# Patient Record
Sex: Female | Born: 1935 | Race: White | Hispanic: No | Smoking: Never smoker
Health system: Southern US, Community
[De-identification: ages and names within clinical notes are randomized; demographics above are authoritative.]

## PROBLEM LIST (undated history)

## (undated) DIAGNOSIS — E785 Hyperlipidemia, unspecified: Secondary | ICD-10-CM

## (undated) DIAGNOSIS — F419 Anxiety disorder, unspecified: Secondary | ICD-10-CM

## (undated) DIAGNOSIS — E46 Unspecified protein-calorie malnutrition: Secondary | ICD-10-CM

## (undated) DIAGNOSIS — R7989 Other specified abnormal findings of blood chemistry: Secondary | ICD-10-CM

## (undated) DIAGNOSIS — I10 Essential (primary) hypertension: Secondary | ICD-10-CM

## (undated) DIAGNOSIS — I4719 Other supraventricular tachycardia: Secondary | ICD-10-CM

## (undated) DIAGNOSIS — I341 Nonrheumatic mitral (valve) prolapse: Secondary | ICD-10-CM

## (undated) DIAGNOSIS — I480 Paroxysmal atrial fibrillation: Secondary | ICD-10-CM

## (undated) DIAGNOSIS — R9389 Abnormal findings on diagnostic imaging of other specified body structures: Secondary | ICD-10-CM

## (undated) DIAGNOSIS — I951 Orthostatic hypotension: Secondary | ICD-10-CM

## (undated) DIAGNOSIS — I340 Nonrheumatic mitral (valve) insufficiency: Secondary | ICD-10-CM

## (undated) DIAGNOSIS — N182 Chronic kidney disease, stage 2 (mild): Secondary | ICD-10-CM

## (undated) DIAGNOSIS — C8590 Non-Hodgkin lymphoma, unspecified, unspecified site: Secondary | ICD-10-CM

## (undated) DIAGNOSIS — G459 Transient cerebral ischemic attack, unspecified: Secondary | ICD-10-CM

## (undated) DIAGNOSIS — I4729 Other ventricular tachycardia: Secondary | ICD-10-CM

## (undated) DIAGNOSIS — R55 Syncope and collapse: Secondary | ICD-10-CM

## (undated) DIAGNOSIS — I493 Ventricular premature depolarization: Secondary | ICD-10-CM

## (undated) DIAGNOSIS — I77810 Thoracic aortic ectasia: Secondary | ICD-10-CM

## (undated) DIAGNOSIS — M810 Age-related osteoporosis without current pathological fracture: Secondary | ICD-10-CM

## (undated) DIAGNOSIS — I471 Supraventricular tachycardia: Secondary | ICD-10-CM

## (undated) DIAGNOSIS — I472 Ventricular tachycardia: Secondary | ICD-10-CM

## (undated) HISTORY — DX: Age-related osteoporosis without current pathological fracture: M81.0

## (undated) HISTORY — PX: CATARACT EXTRACTION: SUR2

## (undated) HISTORY — DX: Transient cerebral ischemic attack, unspecified: G45.9

## (undated) HISTORY — PX: OTHER SURGICAL HISTORY: SHX169

## (undated) HISTORY — DX: Chronic kidney disease, stage 2 (mild): N18.2

---

## 2007-09-28 ENCOUNTER — Ambulatory Visit: Payer: Self-pay | Admitting: Cardiology

## 2007-10-05 ENCOUNTER — Ambulatory Visit: Payer: Self-pay

## 2007-10-05 ENCOUNTER — Encounter: Payer: Self-pay | Admitting: Cardiology

## 2007-10-21 ENCOUNTER — Ambulatory Visit: Payer: Self-pay | Admitting: Cardiology

## 2008-01-28 ENCOUNTER — Encounter: Admission: RE | Admit: 2008-01-28 | Discharge: 2008-01-28 | Payer: Self-pay | Admitting: Internal Medicine

## 2008-01-28 ENCOUNTER — Ambulatory Visit: Payer: Self-pay | Admitting: Oncology

## 2008-02-21 ENCOUNTER — Ambulatory Visit (HOSPITAL_COMMUNITY): Admission: RE | Admit: 2008-02-21 | Discharge: 2008-02-21 | Payer: Self-pay | Admitting: Internal Medicine

## 2008-02-21 LAB — CBC WITH DIFFERENTIAL/PLATELET
BASO%: 0.7 % (ref 0.0–2.0)
Basophils Absolute: 0 10*3/uL (ref 0.0–0.1)
EOS%: 2.2 % (ref 0.0–7.0)
Eosinophils Absolute: 0.1 10*3/uL (ref 0.0–0.5)
HCT: 37.3 % (ref 34.8–46.6)
LYMPH%: 44.4 % (ref 14.0–48.0)
MCH: 34.4 pg — ABNORMAL HIGH (ref 26.0–34.0)
NEUT#: 2 10*3/uL (ref 1.5–6.5)
NEUT%: 42.5 % (ref 39.6–76.8)
Platelets: 176 10*3/uL (ref 145–400)
RDW: 13.8 % (ref 11.3–14.5)
WBC: 4.7 10*3/uL (ref 3.9–10.0)

## 2008-02-21 LAB — MORPHOLOGY
PLT EST: ADEQUATE
RBC Comments: NORMAL

## 2008-02-22 LAB — COMPREHENSIVE METABOLIC PANEL
ALT: 20 U/L (ref 0–35)
CO2: 26 mEq/L (ref 19–32)
Total Protein: 6.5 g/dL (ref 6.0–8.3)

## 2008-04-25 ENCOUNTER — Emergency Department (HOSPITAL_COMMUNITY): Admission: EM | Admit: 2008-04-25 | Discharge: 2008-04-25 | Payer: Self-pay | Admitting: Emergency Medicine

## 2008-06-05 ENCOUNTER — Ambulatory Visit: Payer: Self-pay | Admitting: Oncology

## 2008-06-07 ENCOUNTER — Ambulatory Visit (HOSPITAL_COMMUNITY): Admission: RE | Admit: 2008-06-07 | Discharge: 2008-06-07 | Payer: Self-pay | Admitting: Oncology

## 2008-06-07 LAB — CBC WITH DIFFERENTIAL/PLATELET
Basophils Absolute: 0 10*3/uL (ref 0.0–0.1)
HGB: 13.3 g/dL (ref 11.6–15.9)
MCV: 99.6 fL (ref 81.0–101.0)
MONO#: 0.4 10*3/uL (ref 0.1–0.9)
NEUT#: 2.6 10*3/uL (ref 1.5–6.5)
NEUT%: 54.4 % (ref 39.6–76.8)
WBC: 4.8 10*3/uL (ref 3.9–10.0)
lymph#: 1.8 10*3/uL (ref 0.9–3.3)

## 2008-06-07 LAB — LACTATE DEHYDROGENASE: LDH: 145 U/L (ref 94–250)

## 2008-06-07 LAB — COMPREHENSIVE METABOLIC PANEL
Alkaline Phosphatase: 76 U/L (ref 39–117)
CO2: 22 mEq/L (ref 19–32)
Calcium: 9.3 mg/dL (ref 8.4–10.5)
Sodium: 133 mEq/L — ABNORMAL LOW (ref 135–145)

## 2008-11-29 ENCOUNTER — Ambulatory Visit: Payer: Self-pay | Admitting: Oncology

## 2010-06-24 ENCOUNTER — Encounter: Payer: Self-pay | Admitting: Internal Medicine

## 2010-07-23 ENCOUNTER — Telehealth (INDEPENDENT_AMBULATORY_CARE_PROVIDER_SITE_OTHER): Payer: Self-pay | Admitting: *Deleted

## 2010-07-30 NOTE — Progress Notes (Signed)
  Pt signed ROI,faxed to me I Mailed out records to pt today [facility]  July 23, 2010 2:11 PM

## 2010-10-15 NOTE — Assessment & Plan Note (Signed)
[facility]                            CARDIOLOGY OFFICE NOTE   NAME:[name], [name]                        MRN:          [id number]  DATE:10/21/2007                            DOB:          05/10/1934    PRIMARY CARE PHYSICIAN:  Della Goo, M.D.   REASON FOR PRESENTATION:  Evaluate a patient with fatigue and previous  cardiomyopathy.   HISTORY OF PRESENT ILLNESS:  The patient is a lovely 77 year old white  female with a history of cardiomyopathy related to doxorubicin and CHOP  in the past.  This was in 2006.  She had not had any followup since that  time.  She came to me when she was describing increased dyspnea with  exertion.  I was delighted when I ordered an echo and she had an  ejection fraction of 60%.  She had some mild mitral valve prolapse but  no significant regurgitation.   She returns for followup.  She says that she is actually getting better.  She has been able to walk more.  She is having much less dyspnea.  She  is not having any PND or orthopnea.  She has had no palpitations,  presyncope or syncope.   Of note she did have carotid Dopplers demonstrating bilateral less than  40% stenosis.   PAST MEDICAL HISTORY:  Hypertension x10 years, hyperlipidemia, previous  cardiomyopathy as described, anemia, TIA in 2006, non-Hodgkin's lymphoma  treated with CHOP and doxorubicin.   ALLERGIES:  BIAXIN AND ZOCOR.   MEDICATIONS:  Toprol XL 25 mg daily, Lexapro 10 mg daily, Altace 5 mg  b.i.d., Lipitor 20 mg daily, aspirin 325 mg daily.   REVIEW OF SYSTEMS:  As stated in the HPI and otherwise negative for  other systems.   PHYSICAL EXAMINATION:  The patient is in no distress.  Blood pressure  123/64, heart rate 61 and regular, weight 118 pounds, body mass index  19.  NECK:  No jugular venous distention at 45 degrees.  Carotid upstrokes  brisk and symmetrical.  No bruits, no thyromegaly.  LYMPHATICS:  No cervical, axillary, or  inguinal adenopathy.  LUNGS:  Clear to auscultation bilaterally.  HEART:  PMI not displaced or sustained.  S1 and S2 within normal limits.  No S3, no S4.  No clicks, no rubs, no murmurs.  ABDOMEN:  Flat.  Positive bowel sounds.  Normal in frequency and pitch.  No bruits, rebound, guarding or midline pulsatile mass.  No hepatomegaly  or splenomegaly.  SKIN:  No rashes.  No nodules.  EXTREMITIES:  2+ pulses.  No edema.   ASSESSMENT/PLAN:  1. Dyspnea.  The patient's dyspnea is actually improved.  I wonder if      this could have been related to allergies.  She is new to this      area.  It seems to be correlating with a high peak pollen count.      At this point I do not see a cardiovascular etiology and would      suggest no further cardiovascular testing.  Perhaps pulmonary      function testing  or referral to a pulmonologist would be      appropriate if her symptoms worsen.  2. Hypertension.  Blood pressure is well controlled and she will      continue the medications as listed.  3. Carotid stenosis.  The patient has some mild carotid stenosis and      is scheduled to have a followup in 1 year.  4. Dyslipidemia.  Per Dr. Lovell Sheehan.  5. Followup can be back in this clinic as needed.     Rollene Rotunda, MD, [facility]  Electronically Signed    JH/MedQ  DD: 10/21/2007  DT: 10/21/2007  Job #: [id number]   cc:   Della Goo, M.D.
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
## 2011-03-03 LAB — GLUCOSE, CAPILLARY: Glucose-Capillary: 86

## 2011-03-04 LAB — URINALYSIS, ROUTINE W REFLEX MICROSCOPIC
Bilirubin Urine: NEGATIVE
Glucose, UA: NEGATIVE
Hgb urine dipstick: NEGATIVE
Ketones, ur: NEGATIVE
Nitrite: NEGATIVE
Protein, ur: NEGATIVE
Specific Gravity, Urine: 1.006
Urobilinogen, UA: 0.2
pH: 7.5

## 2011-03-04 LAB — DIFFERENTIAL
Basophils Absolute: 0
Basophils Relative: 1
Eosinophils Absolute: 0
Eosinophils Relative: 1
Lymphocytes Relative: 27
Lymphs Abs: 1.1
Monocytes Absolute: 0.2
Monocytes Relative: 5
Neutro Abs: 2.6
Neutrophils Relative %: 66

## 2011-03-04 LAB — CBC
HCT: 39.5
Hemoglobin: 13.2
MCHC: 33.5
MCV: 102.1 — ABNORMAL HIGH
Platelets: 160
RBC: 3.87
RDW: 13.8
WBC: 3.9 — ABNORMAL LOW

## 2011-03-04 LAB — BASIC METABOLIC PANEL WITH GFR
Chloride: 101
GFR calc non Af Amer: >60
Glucose, Bld: 132 — ABNORMAL HIGH

## 2011-03-04 LAB — BASIC METABOLIC PANEL
BUN: 4 — ABNORMAL LOW
CO2: 29
Calcium: 9.7
Creatinine, Ser: 0.89
GFR calc Af Amer: >60
Potassium: 4.5
Sodium: 137

## 2011-03-04 LAB — OCCULT BLOOD X 1 CARD TO LAB, STOOL: Fecal Occult Bld: NEGATIVE

## 2011-03-04 LAB — PROTIME-INR
INR: 1
Prothrombin Time: 12.8

## 2011-03-04 LAB — POCT CARDIAC MARKERS
CKMB, poc: <1 — ABNORMAL LOW
Myoglobin, poc: 73.7
Troponin i, poc: <0.05

## 2011-03-04 LAB — APTT: aPTT: 22 — ABNORMAL LOW

## 2011-07-29 DIAGNOSIS — C8589 Other specified types of non-Hodgkin lymphoma, extranodal and solid organ sites: Secondary | ICD-10-CM | POA: Diagnosis not present

## 2011-08-21 DIAGNOSIS — R0602 Shortness of breath: Secondary | ICD-10-CM | POA: Diagnosis not present

## 2011-08-21 DIAGNOSIS — R42 Dizziness and giddiness: Secondary | ICD-10-CM | POA: Diagnosis not present

## 2011-08-21 DIAGNOSIS — I10 Essential (primary) hypertension: Secondary | ICD-10-CM | POA: Diagnosis not present

## 2011-08-21 DIAGNOSIS — E7800 Pure hypercholesterolemia, unspecified: Secondary | ICD-10-CM | POA: Diagnosis not present

## 2011-09-18 DIAGNOSIS — I10 Essential (primary) hypertension: Secondary | ICD-10-CM | POA: Diagnosis not present

## 2011-09-18 DIAGNOSIS — M810 Age-related osteoporosis without current pathological fracture: Secondary | ICD-10-CM | POA: Diagnosis not present

## 2011-09-18 DIAGNOSIS — E0590 Thyrotoxicosis, unspecified without thyrotoxic crisis or storm: Secondary | ICD-10-CM | POA: Diagnosis not present

## 2011-09-18 DIAGNOSIS — I059 Rheumatic mitral valve disease, unspecified: Secondary | ICD-10-CM | POA: Diagnosis not present

## 2011-09-18 DIAGNOSIS — M159 Polyosteoarthritis, unspecified: Secondary | ICD-10-CM | POA: Diagnosis not present

## 2011-09-18 DIAGNOSIS — E7800 Pure hypercholesterolemia, unspecified: Secondary | ICD-10-CM | POA: Diagnosis not present

## 2011-09-18 DIAGNOSIS — R079 Chest pain, unspecified: Secondary | ICD-10-CM | POA: Diagnosis not present

## 2011-09-18 DIAGNOSIS — C8589 Other specified types of non-Hodgkin lymphoma, extranodal and solid organ sites: Secondary | ICD-10-CM | POA: Diagnosis not present

## 2011-10-08 DIAGNOSIS — I10 Essential (primary) hypertension: Secondary | ICD-10-CM | POA: Diagnosis not present

## 2011-10-08 DIAGNOSIS — I2510 Atherosclerotic heart disease of native coronary artery without angina pectoris: Secondary | ICD-10-CM | POA: Diagnosis not present

## 2011-10-08 DIAGNOSIS — I248 Other forms of acute ischemic heart disease: Secondary | ICD-10-CM | POA: Diagnosis not present

## 2011-10-08 DIAGNOSIS — E7800 Pure hypercholesterolemia, unspecified: Secondary | ICD-10-CM | POA: Diagnosis not present

## 2011-10-14 DIAGNOSIS — R9439 Abnormal result of other cardiovascular function study: Secondary | ICD-10-CM | POA: Diagnosis not present

## 2011-10-14 DIAGNOSIS — I2510 Atherosclerotic heart disease of native coronary artery without angina pectoris: Secondary | ICD-10-CM | POA: Diagnosis not present

## 2011-10-14 DIAGNOSIS — R0609 Other forms of dyspnea: Secondary | ICD-10-CM | POA: Diagnosis not present

## 2011-10-14 DIAGNOSIS — R0989 Other specified symptoms and signs involving the circulatory and respiratory systems: Secondary | ICD-10-CM | POA: Diagnosis not present

## 2011-10-23 DIAGNOSIS — E7800 Pure hypercholesterolemia, unspecified: Secondary | ICD-10-CM | POA: Diagnosis not present

## 2011-10-23 DIAGNOSIS — I4891 Unspecified atrial fibrillation: Secondary | ICD-10-CM | POA: Diagnosis not present

## 2011-10-23 DIAGNOSIS — R0602 Shortness of breath: Secondary | ICD-10-CM | POA: Diagnosis not present

## 2011-10-23 DIAGNOSIS — R9431 Abnormal electrocardiogram [ECG] [EKG]: Secondary | ICD-10-CM | POA: Diagnosis not present

## 2011-10-30 DIAGNOSIS — I059 Rheumatic mitral valve disease, unspecified: Secondary | ICD-10-CM | POA: Diagnosis not present

## 2011-10-30 DIAGNOSIS — R002 Palpitations: Secondary | ICD-10-CM | POA: Diagnosis not present

## 2011-10-30 DIAGNOSIS — I4891 Unspecified atrial fibrillation: Secondary | ICD-10-CM | POA: Diagnosis not present

## 2011-11-06 DIAGNOSIS — I059 Rheumatic mitral valve disease, unspecified: Secondary | ICD-10-CM | POA: Diagnosis not present

## 2011-11-06 DIAGNOSIS — R579 Shock, unspecified: Secondary | ICD-10-CM | POA: Diagnosis not present

## 2011-11-06 DIAGNOSIS — E7800 Pure hypercholesterolemia, unspecified: Secondary | ICD-10-CM | POA: Diagnosis not present

## 2011-11-06 DIAGNOSIS — I10 Essential (primary) hypertension: Secondary | ICD-10-CM | POA: Diagnosis not present

## 2011-12-05 DIAGNOSIS — Z0000 Encounter for general adult medical examination without abnormal findings: Secondary | ICD-10-CM | POA: Diagnosis not present

## 2011-12-15 DIAGNOSIS — I4891 Unspecified atrial fibrillation: Secondary | ICD-10-CM | POA: Diagnosis not present

## 2011-12-31 DIAGNOSIS — E7800 Pure hypercholesterolemia, unspecified: Secondary | ICD-10-CM | POA: Diagnosis not present

## 2011-12-31 DIAGNOSIS — R002 Palpitations: Secondary | ICD-10-CM | POA: Diagnosis not present

## 2011-12-31 DIAGNOSIS — I10 Essential (primary) hypertension: Secondary | ICD-10-CM | POA: Diagnosis not present

## 2011-12-31 DIAGNOSIS — I2510 Atherosclerotic heart disease of native coronary artery without angina pectoris: Secondary | ICD-10-CM | POA: Diagnosis not present

## 2012-01-16 DIAGNOSIS — C8599 Non-Hodgkin lymphoma, unspecified, extranodal and solid organ sites: Secondary | ICD-10-CM

## 2012-02-05 DIAGNOSIS — I2510 Atherosclerotic heart disease of native coronary artery without angina pectoris: Secondary | ICD-10-CM | POA: Diagnosis not present

## 2012-02-05 DIAGNOSIS — E785 Hyperlipidemia, unspecified: Secondary | ICD-10-CM | POA: Diagnosis not present

## 2012-02-05 DIAGNOSIS — M1990 Unspecified osteoarthritis, unspecified site: Secondary | ICD-10-CM | POA: Diagnosis not present

## 2012-02-05 DIAGNOSIS — I10 Essential (primary) hypertension: Secondary | ICD-10-CM | POA: Diagnosis not present

## 2012-03-05 DIAGNOSIS — J020 Streptococcal pharyngitis: Secondary | ICD-10-CM | POA: Diagnosis not present

## 2012-04-07 DIAGNOSIS — E785 Hyperlipidemia, unspecified: Secondary | ICD-10-CM | POA: Diagnosis not present

## 2012-04-07 DIAGNOSIS — H6690 Otitis media, unspecified, unspecified ear: Secondary | ICD-10-CM | POA: Diagnosis not present

## 2012-11-25 DIAGNOSIS — N390 Urinary tract infection, site not specified: Secondary | ICD-10-CM | POA: Diagnosis not present

## 2012-11-25 DIAGNOSIS — F3289 Other specified depressive episodes: Secondary | ICD-10-CM | POA: Diagnosis not present

## 2012-11-25 DIAGNOSIS — E785 Hyperlipidemia, unspecified: Secondary | ICD-10-CM | POA: Diagnosis not present

## 2012-11-25 DIAGNOSIS — F329 Major depressive disorder, single episode, unspecified: Secondary | ICD-10-CM | POA: Diagnosis not present

## 2012-11-25 DIAGNOSIS — N3090 Cystitis, unspecified without hematuria: Secondary | ICD-10-CM | POA: Diagnosis not present

## 2012-11-25 DIAGNOSIS — C8583 Other specified types of non-Hodgkin lymphoma, intra-abdominal lymph nodes: Secondary | ICD-10-CM | POA: Diagnosis not present

## 2012-11-25 DIAGNOSIS — Z0000 Encounter for general adult medical examination without abnormal findings: Secondary | ICD-10-CM | POA: Diagnosis not present

## 2012-11-25 DIAGNOSIS — I2510 Atherosclerotic heart disease of native coronary artery without angina pectoris: Secondary | ICD-10-CM | POA: Diagnosis not present

## 2012-11-25 DIAGNOSIS — I10 Essential (primary) hypertension: Secondary | ICD-10-CM | POA: Diagnosis not present

## 2013-01-06 DIAGNOSIS — Z0000 Encounter for general adult medical examination without abnormal findings: Secondary | ICD-10-CM | POA: Diagnosis not present

## 2013-01-06 DIAGNOSIS — E785 Hyperlipidemia, unspecified: Secondary | ICD-10-CM | POA: Diagnosis not present

## 2013-01-06 DIAGNOSIS — F3289 Other specified depressive episodes: Secondary | ICD-10-CM | POA: Diagnosis not present

## 2013-01-06 DIAGNOSIS — I10 Essential (primary) hypertension: Secondary | ICD-10-CM | POA: Diagnosis not present

## 2013-01-06 DIAGNOSIS — M1990 Unspecified osteoarthritis, unspecified site: Secondary | ICD-10-CM | POA: Diagnosis not present

## 2013-01-06 DIAGNOSIS — I2510 Atherosclerotic heart disease of native coronary artery without angina pectoris: Secondary | ICD-10-CM | POA: Diagnosis not present

## 2013-01-06 DIAGNOSIS — F329 Major depressive disorder, single episode, unspecified: Secondary | ICD-10-CM | POA: Diagnosis not present

## 2013-01-06 DIAGNOSIS — C8583 Other specified types of non-Hodgkin lymphoma, intra-abdominal lymph nodes: Secondary | ICD-10-CM | POA: Diagnosis not present

## 2013-03-04 DIAGNOSIS — H16229 Keratoconjunctivitis sicca, not specified as Sjogren's, unspecified eye: Secondary | ICD-10-CM | POA: Diagnosis not present

## 2013-05-17 DIAGNOSIS — J069 Acute upper respiratory infection, unspecified: Secondary | ICD-10-CM | POA: Diagnosis not present

## 2013-05-17 DIAGNOSIS — B9689 Other specified bacterial agents as the cause of diseases classified elsewhere: Secondary | ICD-10-CM | POA: Diagnosis not present

## 2013-05-17 DIAGNOSIS — J329 Chronic sinusitis, unspecified: Secondary | ICD-10-CM | POA: Diagnosis not present

## 2013-08-05 DIAGNOSIS — H2510 Age-related nuclear cataract, unspecified eye: Secondary | ICD-10-CM | POA: Diagnosis not present

## 2013-08-19 DIAGNOSIS — H269 Unspecified cataract: Secondary | ICD-10-CM | POA: Diagnosis not present

## 2013-08-19 DIAGNOSIS — H2510 Age-related nuclear cataract, unspecified eye: Secondary | ICD-10-CM | POA: Diagnosis not present

## 2013-09-09 DIAGNOSIS — H2510 Age-related nuclear cataract, unspecified eye: Secondary | ICD-10-CM | POA: Diagnosis not present

## 2013-09-09 DIAGNOSIS — H269 Unspecified cataract: Secondary | ICD-10-CM | POA: Diagnosis not present

## 2013-10-28 DIAGNOSIS — R51 Headache: Secondary | ICD-10-CM | POA: Diagnosis not present

## 2013-10-28 DIAGNOSIS — H905 Unspecified sensorineural hearing loss: Secondary | ICD-10-CM | POA: Diagnosis not present

## 2013-11-04 DIAGNOSIS — R51 Headache: Secondary | ICD-10-CM | POA: Diagnosis not present

## 2013-11-15 ENCOUNTER — Encounter: Payer: Self-pay | Admitting: Neurology

## 2013-11-15 ENCOUNTER — Ambulatory Visit: Payer: Medicare Other | Admitting: Neurology

## 2013-11-15 DIAGNOSIS — R51 Headache: Principal | ICD-10-CM

## 2013-11-15 DIAGNOSIS — I10 Essential (primary) hypertension: Secondary | ICD-10-CM

## 2013-11-15 DIAGNOSIS — I639 Cerebral infarction, unspecified: Secondary | ICD-10-CM

## 2013-11-15 DIAGNOSIS — R519 Headache, unspecified: Secondary | ICD-10-CM

## 2013-11-24 ENCOUNTER — Ambulatory Visit: Payer: Medicare Other | Admitting: Neurology

## 2013-12-01 ENCOUNTER — Ambulatory Visit (INDEPENDENT_AMBULATORY_CARE_PROVIDER_SITE_OTHER): Payer: Medicare Other | Admitting: Neurology

## 2013-12-01 ENCOUNTER — Encounter (INDEPENDENT_AMBULATORY_CARE_PROVIDER_SITE_OTHER): Payer: Self-pay

## 2013-12-01 ENCOUNTER — Encounter: Payer: Self-pay | Admitting: Neurology

## 2013-12-01 VITALS — BP 89/60 | HR 85 | Ht 67.0 in | Wt 103.0 lb

## 2013-12-01 DIAGNOSIS — R519 Headache, unspecified: Secondary | ICD-10-CM

## 2013-12-01 DIAGNOSIS — I6350 Cerebral infarction due to unspecified occlusion or stenosis of unspecified cerebral artery: Secondary | ICD-10-CM

## 2013-12-01 DIAGNOSIS — R51 Headache: Secondary | ICD-10-CM | POA: Diagnosis not present

## 2013-12-01 MED ORDER — GABAPENTIN 100 MG PO CAPS: ORAL_CAPSULE | ORAL | Status: DC

## 2013-12-01 NOTE — Progress Notes (Signed)
  PATIENT: [name] DOB: 08/08/1933  HISTORICAL  [name] is a 79 years old right-handed female, referred by her primary care physician PA [name] for evaluation of bilateral ear discomfort.  She had a past medical history of hypertension, hyperlipidemia, had non-Hodgkin's lymphoma in 2005, received chemotherapy then, around that time, she developed intermittent left ear, left face discomfort, she described deep achy pain starting from her left ear, radiating towards her left forehead, left cheek area, over the years, she has multiple recurrent episode, increased frequency of the past couple years, she was always told that it was ear infection, each time she was treated with antibiotics, over the past few years, she has to take up to 4 courses of antibiotics yearly, each episodes last about one to 2 weeks, she denies shooting pain  Over the years, she noticed gradually fairly symmetric hearing loss, in the past couple years, associated with her left facial pain, she also noticed transient dizziness, vertigo,  In between that episode, she denies visual loss, she denies gait difficulty,  She was evaluated by ENT [name] in May 2015, reported normal bilateral tympanic membrane,  audiogram showed a symmetric downsloping mild to moderate severe sensorineural hearing loss,   She has no left facial pain right now,  Laboratory evaluation showed normal CBC, C. reactive protein,  REVIEW OF SYSTEMS: Full 14 system review of systems performed and notable only for hearing loss, spinning sensation, fatigue, easy bruising, runny nose, dizziness, restless leg  ALLERGIES: Allergies  Allergen Reactions  . Biaxin [Clarithromycin]     HOME MEDICATIONS: Current Outpatient Prescriptions on File Prior to Visit  Medication Sig Dispense Refill  . Atorvastatin Calcium (LIPITOR PO) Take by mouth daily.      . Escitalopram Oxalate (LEXAPRO PO) Take by mouth daily.      . Metoprolol  Succinate (TOPROL XL PO) Take by mouth daily.      . Ramipril (ALTACE PO) Take by mouth.        PAST MEDICAL HISTORY: Past Medical History  Diagnosis Date  . Headache   . High blood pressure   . Osteoporosis   . Stroke     PAST SURGICAL HISTORY: Past Surgical History  Procedure Laterality Date  . Cataract extraction    . Oral tooth     FAMILY HISTORY: History reviewed. No pertinent family history.  SOCIAL HISTORY:  History   Social History  . Marital Status: Single    Spouse Name: N/A    Number of Children: 9  . Years of Education: college   Occupational History    Retired, was the director of a health spa.   Social History Main Topics  . Smoking status: Never Smoker   . Smokeless tobacco: Never Used  . Alcohol Use: No  . Drug Use: No  . Sexual Activity: Not on file   Other Topics Concern  . Not on file   Social History Narrative   Patient lives at home alone and she widowed.    Retired   Education college.   Right handed.   Caffeine - None    PHYSICAL EXAM   Filed Vitals:   12/01/13 1106  BP: 89/60  Pulse: 85  Height: 5' 7" (1.702 m)  Weight: 103 lb (46.72 kg)    Not recorded    Body mass index is 16.13 kg/(m^2).   Generalized: In no acute distress  Neck: Supple, no carotid bruits   Cardiac: Regular rate rhythm  Pulmonary: Clear to   auscultation bilaterally  Musculoskeletal: No deformity  Neurological examination  Mentation: Alert oriented to time, place, history taking, and causual conversation  Cranial nerve II-XII: Pupils were equal round reactive to light. Extraocular movements were full.  Visual field were full on confrontational test. Bilateral fundi were sharp.  Facial sensation and strength were normal. Hearing was intact to finger rubbing bilaterally. Uvula tongue midline.  Head turning and shoulder shrug and were normal and symmetric.Tongue protrusion into cheek strength was normal.  Motor: Normal tone, bulk and  strength.  Sensory: Intact to fine touch, pinprick, preserved vibratory sensation, and proprioception at toes.  Coordination: Normal finger to nose, heel-to-shin bilaterally there was no truncal ataxia  Gait: Rising up from seated position without assistance, normal stance, without trunk ataxia, moderate stride, good arm swing, smooth turning, able to perform tiptoe, and heel walking without difficulty.   Romberg signs: Negative  Deep tendon reflexes: Brachioradialis 2/2, biceps 2/2, triceps 2/2, patellar 2/2, Achilles 2/2, plantar responses were flexor bilaterally.   DIAGNOSTIC DATA (LABS, IMAGING, TESTING) - I reviewed patient records, labs, notes, testing and imaging myself where available.  Lab Results  Component Value Date   WBC 4.8 06/07/2008   HGB 13.3 06/07/2008   HCT 38.5 06/07/2008   MCV 99.6 06/07/2008   PLT 164 06/07/2008      Component Value Date/Time   NA 133* 06/07/2008 1524   K 4.2 06/07/2008 1524   CL 99 06/07/2008 1524   CO2 22 06/07/2008 1524   GLUCOSE 101* 06/07/2008 1524   BUN 8 06/07/2008 1524   CREATININE 0.84 06/07/2008 1524   CALCIUM 9.3 06/07/2008 1524   PROT 6.6 06/07/2008 1524   ALBUMIN 4.2 06/07/2008 1524   AST 29 06/07/2008 1524   ALT 17 06/07/2008 1524   ALKPHOS 76 06/07/2008 1524   BILITOT 0.7 06/07/2008 1524   GFRNONAA >60 04/25/2008 1307   GFRAA  Value: >60        The eGFR has been calculated using the MDRD equation. This calculation has not been validated in all clinical 04/25/2008 1307   ASSESSMENT AND PLAN  [name] is a 79 y.o. female complains of   recurrent episode of left facial pain, recent associated with vertigo, bilateral sensorineural hearing loss,  Differentiation diagnosis including atypical facial pain, trigeminal neuralgia, vs. left CP angle pathology,  Proceed with MRI of the brain with and without contrast Neurontin 100 mg, gradually titrating to 300 mg 3 times a day for left facial pain Return to clinic in 3-4 weeks   [name], M.D.  Ph.D.  [facility] [address], [address] [place], [place] [address] [phone] 

## 2013-12-06 ENCOUNTER — Telehealth: Payer: Self-pay | Admitting: Neurology

## 2013-12-06 ENCOUNTER — Ambulatory Visit
Admission: RE | Admit: 2013-12-06 | Discharge: 2013-12-06 | Disposition: A | Discharge status: Home / Self Care | Payer: Medicare Other | Source: Ambulatory Visit | Attending: Neurology | Admitting: Neurology

## 2013-12-06 DIAGNOSIS — R51 Headache: Principal | ICD-10-CM

## 2013-12-06 DIAGNOSIS — R519 Headache, unspecified: Secondary | ICD-10-CM

## 2013-12-06 MED ORDER — GADOBENATE DIMEGLUMINE 529 MG/ML IV SOLN
9.0000 mL | Freq: Once | INTRAVENOUS | Status: AC | PRN
  Administered 2013-12-06: 9 mL via INTRAVENOUS

## 2013-12-06 NOTE — Telephone Encounter (Signed)
Patient requesting earlier appointment with Dr Krista Blue (7/29) to discuss MRI results which was done today 12/06/13.  Please call and advise

## 2013-12-06 NOTE — Telephone Encounter (Signed)
Called pt to inform her that her MRI results were not in yet and pt stated that she wanted to come in earlier than 12/28/13 to discuss her MRI results. Pt's MRI was done today. Please advise

## 2013-12-06 NOTE — Telephone Encounter (Signed)
[name], please move up her appt to my next available, we will call her MRI report once it is available.

## 2013-12-08 ENCOUNTER — Telehealth: Payer: Self-pay | Admitting: Neurology

## 2013-12-08 NOTE — Telephone Encounter (Signed)
Will review MRI findings at her follow up 7/15

## 2013-12-08 NOTE — Telephone Encounter (Signed)
Patient called me back she is scheduled with Dr.Yan July 15th patient is aware and she will be here for MRI results.

## 2013-12-12 ENCOUNTER — Other Ambulatory Visit: Payer: Self-pay

## 2013-12-14 ENCOUNTER — Encounter: Payer: Self-pay | Admitting: Neurology

## 2013-12-14 ENCOUNTER — Ambulatory Visit (INDEPENDENT_AMBULATORY_CARE_PROVIDER_SITE_OTHER): Payer: Medicare Other | Admitting: Neurology

## 2013-12-14 VITALS — BP 95/56 | HR 64 | Ht 67.0 in | Wt 104.0 lb

## 2013-12-14 DIAGNOSIS — I6350 Cerebral infarction due to unspecified occlusion or stenosis of unspecified cerebral artery: Secondary | ICD-10-CM

## 2013-12-14 DIAGNOSIS — R519 Headache, unspecified: Secondary | ICD-10-CM

## 2013-12-14 DIAGNOSIS — R51 Headache: Secondary | ICD-10-CM | POA: Diagnosis not present

## 2013-12-14 NOTE — Progress Notes (Signed)
PATIENT: [name] DOB: 09-25-1933  HISTORICAL  Illianna Paschal is a 80 years old right-handed female, referred by her primary care physician PA Dionisio David for evaluation of bilateral ear discomfort.  She had a past medical history of hypertension, hyperlipidemia, had non-Hodgkin's lymphoma in 2005, received chemotherapy then, around that time, she developed intermittent left ear, left face discomfort, she described deep achy pain starting from her left ear, radiating towards her left forehead, left cheek area, over the years, she has multiple recurrent episode, increased frequency of the past couple years, she was always told that it was ear infection, each time she was treated with antibiotics, over the past few years, she has to take up to 4 courses of antibiotics yearly, each episodes last about one to 2 weeks, she denies shooting pain  Over the years, she noticed gradually fairly symmetric hearing loss, in the past couple years, associated with her left facial pain, she also noticed transient dizziness, vertigo,  In between that episode, she denies visual loss, she denies gait difficulty,  She was evaluated by ENT Jacolyn Reedy in May 2015, reported normal bilateral tympanic membrane,  audiogram showed a symmetric downsloping mild to moderate severe sensorineural hearing loss,   She has no left facial pain right now,   Laboratory evaluation showed normal CBC, C. reactive protein.  UPDATE July 15th 2015: She has not taken gabapentin yet, because she has no left facial pain recently.  We have reviewed MRI brain w/wo, showed mild gliosis, mild atrophy. I also talked with her daughter on phone.  REVIEW OF SYSTEMS: Full 14 system review of systems performed and notable only for dizziness, bruise.  ALLERGIES: Allergies  Allergen Reactions  . Biaxin [Clarithromycin]     HOME MEDICATIONS: Current Outpatient Prescriptions on File Prior to Visit  Medication Sig Dispense  Refill  . Atorvastatin Calcium (LIPITOR PO) Take by mouth daily.      . Escitalopram Oxalate (LEXAPRO PO) Take by mouth daily.      . Metoprolol Succinate (TOPROL XL PO) Take by mouth daily.      . Ramipril (ALTACE PO) Take by mouth.        PAST MEDICAL HISTORY: Past Medical History  Diagnosis Date  . Headache   . High blood pressure   . Osteoporosis   . Stroke     PAST SURGICAL HISTORY: Past Surgical History  Procedure Laterality Date  . Cataract extraction    . Oral tooth     FAMILY HISTORY: History reviewed. No pertinent family history.  SOCIAL HISTORY:  History   Social History  . Marital Status: Single    Spouse Name: N/A    Number of Children: 39  . 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   Social History Main Topics  . Smoking status: Never Smoker   . Smokeless tobacco: Never Used  . Alcohol Use: No  . Drug Use: No  . Sexual Activity: Not on file   Other Topics Concern  . Not on file   Social History Narrative   Patient lives at home alone and she widowed.    Retired   Veterinary surgeon.   Right handed.   Caffeine - None    PHYSICAL EXAM   Filed Vitals:   12/14/13 0823  BP: 95/56  Pulse: 64  Height: 5' 7"  (1.702 m)  Weight: 104 lb (47.174 kg)    Not recorded  Body mass index is 16.28 kg/(m^2).   Generalized: In no acute distress  Neck: Supple, no carotid bruits   Cardiac: Regular rate rhythm  Pulmonary: Clear to auscultation bilaterally  Musculoskeletal: No deformity  Neurological examination  Mentation: Alert oriented to time, place, history taking, and causual conversation  Cranial nerve II-XII: Pupils were equal round reactive to light. Extraocular movements were full.  Visual field were full on confrontational test. Bilateral fundi were sharp.  Facial sensation and strength were normal. Hearing was intact to finger rubbing bilaterally. Uvula tongue midline.  Head  turning and shoulder shrug and were normal and symmetric.Tongue protrusion into cheek strength was normal.  Motor: Normal tone, bulk and strength.  Sensory: Intact to fine touch, pinprick, preserved vibratory sensation, and proprioception at toes.  Coordination: Normal finger to nose, heel-to-shin bilaterally there was no truncal ataxia  Gait: Rising up from seated position without assistance, normal stance, without trunk ataxia, moderate stride, good arm swing, smooth turning, able to perform tiptoe, and heel walking without difficulty.   Romberg signs: Negative  Deep tendon reflexes: Brachioradialis 2/2, biceps 2/2, triceps 2/2, patellar 2/2, Achilles 2/2, plantar responses were flexor bilaterally.   DIAGNOSTIC DATA (LABS, IMAGING, TESTING) - I reviewed patient records, labs, notes, testing and imaging myself where available.  Lab Results  Component Value Date   WBC 4.8 06/07/2008   HGB 13.3 06/07/2008   HCT 38.5 06/07/2008   MCV 99.6 06/07/2008   PLT 164 06/07/2008      Component Value Date/Time   NA 133* 06/07/2008 1524   K 4.2 06/07/2008 1524   CL 99 06/07/2008 1524   CO2 22 06/07/2008 1524   GLUCOSE 101* 06/07/2008 1524   BUN 8 06/07/2008 1524   CREATININE 0.84 06/07/2008 1524   CALCIUM 9.3 06/07/2008 1524   PROT 6.6 06/07/2008 1524   ALBUMIN 4.2 06/07/2008 1524   AST 29 06/07/2008 1524   ALT 17 06/07/2008 1524   ALKPHOS 76 06/07/2008 1524   BILITOT 0.7 06/07/2008 1524   GFRNONAA >60 04/25/2008 1307   GFRAA  Value: >60        The eGFR has been calculated using the MDRD equation. This calculation has not been validated in all clinical 04/25/2008 [place]  Sharell Hilmer is a 80 y.o. female complains of   recurrent episode of left facial pain, recent associated with vertigo, bilateral sensorineural hearing loss,  Differentiation diagnosis including atypical facial pain, she also has occasionally vertigo spells with SN hearing loss, Mnire's disease could be on the differentiation  list as well. We have reviewed MRI brain w/wo, showed mild gliosis, mild atrophy  Neurontin 100 mg, gradually titrating to 300 mg 3 times a day for left facial pain Return to clinic in 4-6 months with Hoyle Sauer Call office if she has recurrent left facial pain   Marcial Pacas, M.D. Ph.D.  [facility] [address] [place], [place] [address] [phone]

## 2013-12-28 ENCOUNTER — Ambulatory Visit: Payer: Medicare Other | Admitting: Neurology

## 2014-01-02 ENCOUNTER — Ambulatory Visit: Payer: Self-pay | Admitting: Internal Medicine

## 2014-01-04 DIAGNOSIS — R51 Headache: Secondary | ICD-10-CM | POA: Diagnosis not present

## 2014-01-04 DIAGNOSIS — H905 Unspecified sensorineural hearing loss: Secondary | ICD-10-CM | POA: Diagnosis not present

## 2014-01-05 ENCOUNTER — Other Ambulatory Visit: Payer: Self-pay | Admitting: Otolaryngology

## 2014-01-06 ENCOUNTER — Ambulatory Visit: Payer: Self-pay | Admitting: Internal Medicine

## 2014-01-06 ENCOUNTER — Other Ambulatory Visit: Payer: Self-pay | Admitting: Physician Assistant

## 2014-01-06 DIAGNOSIS — R519 Headache, unspecified: Secondary | ICD-10-CM

## 2014-01-06 DIAGNOSIS — R51 Headache: Principal | ICD-10-CM

## 2014-01-12 ENCOUNTER — Ambulatory Visit
Admission: RE | Admit: 2014-01-12 | Discharge: 2014-01-12 | Disposition: A | Discharge status: Home / Self Care | Payer: Medicare Other | Source: Ambulatory Visit | Attending: Physician Assistant | Admitting: Physician Assistant

## 2014-01-12 DIAGNOSIS — R51 Headache: Secondary | ICD-10-CM | POA: Diagnosis not present

## 2014-01-12 DIAGNOSIS — R519 Headache, unspecified: Secondary | ICD-10-CM

## 2014-01-12 MED ORDER — IOHEXOL 300 MG/ML  SOLN
75.0000 mL | Freq: Once | INTRAMUSCULAR | Status: AC | PRN
  Administered 2014-01-12: 75 mL via INTRAVENOUS

## 2014-01-17 ENCOUNTER — Telehealth: Payer: Self-pay | Admitting: *Deleted

## 2014-01-17 DIAGNOSIS — Z0289 Encounter for other administrative examinations: Secondary | ICD-10-CM

## 2014-01-17 NOTE — Telephone Encounter (Signed)
Mail patient records 01/17/14.

## 2014-01-19 DIAGNOSIS — H26499 Other secondary cataract, unspecified eye: Secondary | ICD-10-CM | POA: Diagnosis not present

## 2014-01-19 DIAGNOSIS — H43399 Other vitreous opacities, unspecified eye: Secondary | ICD-10-CM | POA: Diagnosis not present

## 2014-01-20 DIAGNOSIS — R51 Headache: Secondary | ICD-10-CM | POA: Diagnosis not present

## 2014-01-20 DIAGNOSIS — M948X9 Other specified disorders of cartilage, unspecified sites: Secondary | ICD-10-CM | POA: Diagnosis not present

## 2014-01-20 DIAGNOSIS — M26629 Arthralgia of temporomandibular joint, unspecified side: Secondary | ICD-10-CM | POA: Diagnosis not present

## 2014-01-31 ENCOUNTER — Ambulatory Visit (INDEPENDENT_AMBULATORY_CARE_PROVIDER_SITE_OTHER): Payer: Medicare Other | Admitting: Internal Medicine

## 2014-01-31 ENCOUNTER — Encounter: Payer: Self-pay | Admitting: Internal Medicine

## 2014-01-31 VITALS — BP 110/64 | HR 69 | Temp 97.4°F | Wt 101.0 lb

## 2014-01-31 DIAGNOSIS — R51 Headache: Secondary | ICD-10-CM

## 2014-01-31 DIAGNOSIS — I6350 Cerebral infarction due to unspecified occlusion or stenosis of unspecified cerebral artery: Secondary | ICD-10-CM | POA: Diagnosis not present

## 2014-01-31 DIAGNOSIS — F438 Other reactions to severe stress: Secondary | ICD-10-CM

## 2014-01-31 DIAGNOSIS — F4320 Adjustment disorder, unspecified: Secondary | ICD-10-CM | POA: Insufficient documentation

## 2014-01-31 DIAGNOSIS — F4329 Adjustment disorder with other symptoms: Secondary | ICD-10-CM

## 2014-01-31 DIAGNOSIS — R000 Tachycardia, unspecified: Secondary | ICD-10-CM

## 2014-01-31 DIAGNOSIS — R634 Abnormal weight loss: Secondary | ICD-10-CM | POA: Diagnosis not present

## 2014-01-31 DIAGNOSIS — R519 Headache, unspecified: Secondary | ICD-10-CM

## 2014-01-31 DIAGNOSIS — R630 Anorexia: Secondary | ICD-10-CM

## 2014-01-31 DIAGNOSIS — I9589 Other hypotension: Secondary | ICD-10-CM

## 2014-01-31 DIAGNOSIS — F4389 Other reactions to severe stress: Secondary | ICD-10-CM

## 2014-01-31 DIAGNOSIS — E785 Hyperlipidemia, unspecified: Secondary | ICD-10-CM

## 2014-01-31 DIAGNOSIS — Z0000 Encounter for general adult medical examination without abnormal findings: Secondary | ICD-10-CM | POA: Diagnosis not present

## 2014-01-31 LAB — CBC WITH DIFFERENTIAL/PLATELET
BASOS PCT: 0.6 % (ref 0.0–3.0)
Basophils Absolute: 0 10*3/uL (ref 0.0–0.1)
Eosinophils Absolute: 0 10*3/uL (ref 0.0–0.7)
Eosinophils Relative: 0.2 % (ref 0.0–5.0)
HCT: 38 % (ref 36.0–46.0)
Hemoglobin: 12.7 g/dL (ref 12.0–15.0)
LYMPHS PCT: 33.6 % (ref 12.0–46.0)
Lymphs Abs: 2 10*3/uL (ref 0.7–4.0)
MCHC: 33.3 g/dL (ref 30.0–36.0)
MCV: 96.7 fl (ref 78.0–100.0)
Monocytes Absolute: 0.4 10*3/uL (ref 0.1–1.0)
Monocytes Relative: 7.2 % (ref 3.0–12.0)
NEUTROS PCT: 58.4 % (ref 43.0–77.0)
Neutro Abs: 3.5 10*3/uL (ref 1.4–7.7)
PLATELETS: 202 10*3/uL (ref 150.0–400.0)
RBC: 3.93 Mil/uL (ref 3.87–5.11)
RDW: 13.5 % (ref 11.5–15.5)
WBC: 6 10*3/uL (ref 4.0–10.5)

## 2014-01-31 LAB — BASIC METABOLIC PANEL
BUN: 14 mg/dL (ref 6–23)
CALCIUM: 9.5 mg/dL (ref 8.4–10.5)
CO2: 29 meq/L (ref 19–32)
CREATININE: 0.8 mg/dL (ref 0.4–1.2)
Chloride: 95 mEq/L — ABNORMAL LOW (ref 96–112)
GFR: 69.72 mL/min (ref >60.00)
Glucose, Bld: 82 mg/dL (ref 70–99)
Potassium: 3.6 mEq/L (ref 3.5–5.1)
Sodium: 132 mEq/L — ABNORMAL LOW (ref 135–145)

## 2014-01-31 LAB — HEPATIC FUNCTION PANEL
ALK PHOS: 53 U/L (ref 39–117)
ALT: 14 U/L (ref 0–35)
AST: 33 U/L (ref 0–37)
Albumin: 3.6 g/dL (ref 3.5–5.2)
BILIRUBIN DIRECT: 0 mg/dL (ref 0.0–0.3)
TOTAL PROTEIN: 6.3 g/dL (ref 6.0–8.3)
Total Bilirubin: 0.6 mg/dL (ref 0.2–1.2)

## 2014-01-31 LAB — TSH: TSH: 0.26 u[IU]/mL — ABNORMAL LOW (ref 0.35–4.50)

## 2014-01-31 LAB — T4, FREE: Free T4: 1.07 ng/dL (ref 0.60–1.60)

## 2014-01-31 MED ORDER — RAMIPRIL 5 MG PO CAPS: 5.0000 mg | ORAL_CAPSULE | Freq: Every day | ORAL | Status: DC

## 2014-01-31 NOTE — Assessment & Plan Note (Signed)
Reduce dose of atorvastatin.  Patient at risk for polypharmacy.

## 2014-01-31 NOTE — Progress Notes (Signed)
Subjective:    Patient ID: [name], female    DOB: [birth year], 80 y.o.   MRN: [id number]  HPI  80 year old white female with history of hypertension, TIA and non-Hodgkin's lymphoma to establish. Patient states she splits her time from living in [place] and  [place]. She grew up in the [place] but she has grandchildren that live in [place].  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 She reports there was associated heart valve damage from chemotherapy. She was evaluated by cardiologist at [facility] in the past. She reports cardiac catheterization performed 4 years ago. No report of coronary artery disease.  No official records to review.  Patient has history of hypertension. She is currently taking ramipril 20 mg once daily and metoprolol XL 12.5 mg once daily. She has been struggling with issues of poor appetite and weight loss recently. She has noticed frequent episodes of low blood pressure readings in the morning. Her hypotensive episodes can be associated with tachycardia.   Patiently usually active.  She walks daily and also practices yoga.  Patient's medical records reviewed. She was evaluated by neurologists in 12/14/2013 for left-sided facial pain. Her MRI was unremarkable. 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 She has had very good response with getting fitted with mouth guard by her dentist.  History of non-Hodgkin's lymphoma-she is followed by Dr. Milon Score oncologist in [place]. She has yearly visits. She reports poor appetite ever since treatment with chemotherapy. Her weight has been fairly stable until recently.    Review of Systems    Constitutional: Negative for activity change, poor appetite and weight loss  Eyes: Negative for visual disturbance.  Respiratory: Negative for cough, chest tightness and shortness of breath.   Cardiovascular: Negative for chest pain.    Genitourinary: Negative for difficulty urinating.  Neurological: Negative for headaches.  Gastrointestinal: Negative for abdominal pain, heartburn melena or hematochezia Psych: Negative for depression or anxiety Musculoskeletal: negative for joint pains.  Negative for gait instability   Past Medical History  Diagnosis Date  . Headache   . High blood pressure   . Osteoporosis   . TIA (transient ischemic attack)     History   Social History  . Marital Status: Widowed    Spouse Name: N/A    Number of Children: 40  . Years of Education: college   Occupational History  .      Retired   Social History Main Topics  . Smoking status: Never Smoker   . Smokeless tobacco: Never Used  . Alcohol Use: No  . Drug Use: No  . Sexual Activity: Not on file   Other Topics Concern  . Not on file   Social History Narrative   Patient lives at home alone and she widowed.    Retired   Veterinary surgeon.   Right handed.   Caffeine - None    Past Surgical History  Procedure Laterality Date  . Cataract extraction    . Oral tooth      Family History  Problem Relation Age of Onset  . Coronary artery disease Father 26  . Brain cancer Sister     Allergies  Allergen Reactions  . Biaxin [Clarithromycin]     No current outpatient prescriptions on file prior to visit.   No current facility-administered medications on file prior to visit.    BP 110/64  Pulse 69  Temp(Src) 97.4 F (36.3 C)  Wt 101 lb (45.813 kg)  EKG shows normal sinus rhythm at 77 beats for minute ST depression and T-wave abnormality in inferolateral leads.  Objective:   Physical Exam  Constitutional: She is oriented to person, place, and time.  Thin pleasant 80 year old female  HENT:  Head: Normocephalic and atraumatic.  Right Ear: External ear normal.  Left Ear: External ear normal.  Mouth/Throat: Oropharynx is clear and moist.  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 Pupils are equal, round, and reactive to light.  Neck: Neck supple.  No carotid bruit  Cardiovascular: Normal rate, regular rhythm and normal heart sounds.   No murmur heard. No murmur with left hand grip or Valsalva maneuver  Pulmonary/Chest: Effort normal and breath sounds normal. She has no wheezes.  Abdominal: Soft. Bowel sounds are normal. She exhibits no mass. There is no tenderness.  Musculoskeletal: She exhibits no edema.  Lymphadenopathy:    She has no cervical adenopathy.  Neurological: She is alert and oriented to person, place, and time. No cranial nerve deficit.  Skin: Skin is warm and dry. No rash noted.  Psychiatric: She has a normal mood and affect. Her behavior is normal.          Assessment & Plan:

## 2014-01-31 NOTE — Assessment & Plan Note (Signed)
Patient has history of non-Hodgkin's lymphoma of the gastrointestinal tract. She has lost 5 pounds in the last several months.  Check CBCD and thyroid studies.  We may need to coordinate with her oncologist at [facility] and weight loss continues.  Consider CT of abdomen and pelvis with contrast.

## 2014-01-31 NOTE — Assessment & Plan Note (Signed)
Patient reports she was started on Lexapro 4 - 5 years ago by her primary care physician in [place]. Medication was started because her blood pressure was uncontrolled and it was suspected that her anxiety was contributing to uncontrolled hypertension.  I suggest reducing dose of Lexapro to 5 mg.  No history or symptoms to suggest underlying anxiety disorder.

## 2014-01-31 NOTE — Assessment & Plan Note (Addendum)
80 year old white female experiencing intermittent episodes of hypotension associated with tachycardia. EKG is abnormal with ST depression and T wave changes in inferolateral leads.   Blood pressure with manual cuff is 96/60. Reduce ramipril dose to 5 mg. Obtain previous cardiac records.  Check CBCD, BMET and thyroid function tests. Refer to cardiology for further evaluation.  Patient has hx of non Hodgkin's Lymphoma treated with chemotherapy.  She reports hx of damage to heart valve from chemotherapy.  Difficult to assess whether she suffered from chemotherapy induced valvulopathy or cardiomyopathy.  Patient reports she had normal cardiac cath 4 years ago. It would be surprising if her cardiologist in [place] did not pick up chemotherapy induced cardiomyopathy during her previous cardiac evaluation.  Patient's current issues with arrhythmia would be more concerning if she had confirmed chemotherapy induced cardiomyopathy.

## 2014-01-31 NOTE — Assessment & Plan Note (Signed)
Improved with treatment for TMJ.

## 2014-02-01 ENCOUNTER — Encounter: Payer: Self-pay | Admitting: Cardiology

## 2014-02-01 ENCOUNTER — Ambulatory Visit (INDEPENDENT_AMBULATORY_CARE_PROVIDER_SITE_OTHER): Payer: Medicare Other | Admitting: Cardiology

## 2014-02-01 VITALS — BP 112/77 | HR 86 | Ht 66.0 in | Wt 102.1 lb

## 2014-02-01 DIAGNOSIS — I10 Essential (primary) hypertension: Secondary | ICD-10-CM | POA: Diagnosis not present

## 2014-02-01 DIAGNOSIS — I059 Rheumatic mitral valve disease, unspecified: Secondary | ICD-10-CM | POA: Diagnosis not present

## 2014-02-01 DIAGNOSIS — I951 Orthostatic hypotension: Secondary | ICD-10-CM

## 2014-02-01 DIAGNOSIS — R9431 Abnormal electrocardiogram [ECG] [EKG]: Secondary | ICD-10-CM | POA: Diagnosis not present

## 2014-02-01 DIAGNOSIS — R002 Palpitations: Secondary | ICD-10-CM | POA: Diagnosis not present

## 2014-02-01 DIAGNOSIS — I6350 Cerebral infarction due to unspecified occlusion or stenosis of unspecified cerebral artery: Secondary | ICD-10-CM

## 2014-02-01 DIAGNOSIS — I341 Nonrheumatic mitral (valve) prolapse: Secondary | ICD-10-CM

## 2014-02-01 NOTE — Progress Notes (Addendum)
[address] [place], [place]  [address] Phone: [phone] Fax:  [phone]  Date:  02/01/2014   ID:  [name], DOB 1933/12/23, MRN [id number]  PCP:  Drema Pry, DO  Cardiologist:  Fransico Him, MD     History of Present Illness: 80 year old white female with history of hypertension, TIA and non-Hodgkin's lymphoma to establish a Cardiologist. Patient states she splits her time from living in [place] and [place]. She grew up in the [place] but she has grandchildren that live in [place]. 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 She reports there was associated heart valve damage from chemotherapy. She was evaluated by cardiologist at [facility] in the past. She reports cardiac catheterization performed 4 years ago. No report of coronary artery disease. No official records to review.  Patient has history of hypertension. She is currently taking ramipril 20 mg once daily and metoprolol XL 12.5 mg once daily. She has been struggling with issues of poor appetite and weight loss recently. She has noticed frequent episodes of low blood pressure readings in the morning.  She also has noticed problems with palpitations that usually occur in the am.  She says that she will get palpitations and then feels lightheaded and then feels like she is going to pass out.  She denies any chest pain and does not think she has SOB.  She occasionally has some LE edema.  Her HR would get up in to the 130's but resolves with rest.  She is usually active. She walks daily and also practices yoga.       Wt Readings from Last 3 Encounters:  02/01/14 102 lb 1.9 oz (46.321 kg)  01/31/14 101 lb (45.813 kg)  12/14/13 104 lb (47.174 kg)     Past Medical History  Diagnosis Date  . Headache   . High blood pressure   . Osteoporosis   . TIA (transient ischemic attack)     Current Outpatient Prescriptions  Medication  Sig Dispense Refill  . atorvastatin (LIPITOR) 10 MG tablet Take 10 mg by mouth daily.       Marland Kitchen escitalopram (LEXAPRO) 5 MG tablet Take 1 tablet (5 mg total) by mouth daily.      . metoprolol succinate (TOPROL XL) 25 MG 24 hr tablet Take 0.5 tablets (12.5 mg total) by mouth daily.      . ramipril (ALTACE) 5 MG capsule Take 1 capsule (5 mg total) by mouth daily.  30 capsule  1   No current facility-administered medications for this visit.    Allergies:    Allergies  Allergen Reactions  . Biaxin [Clarithromycin] Other (See Comments)    Severe stomach burning    Social History:  The patient  reports that she has never smoked. She has never used smokeless tobacco. She reports that she does not drink alcohol or use illicit drugs.   Family History:  The patient's family history includes Brain cancer in her sister; Coronary artery disease (age of onset: 72) in her father.   ROS:  Please see the history of present illness.      All other systems reviewed and negative.   PHYSICAL EXAM: VS:  BP 102/78  Pulse 69  Ht 5\' 6"  (1.676 m)  Wt 102 lb 1.9 oz (46.321 kg)  BMI 16.49 kg/m2 Well nourished, well developed, in no acute distress HEENT: normal Neck: no JVD Cardiac:  normal S1, S2; RRR; no  murmur Lungs:  clear to auscultation bilaterally, no wheezing, rhonchi or rales Abd: soft, nontender, no hepatomegaly Ext: no edema Skin: warm and dry Neuro:  CNs 2-12 intact, no focal abnormalities noted  EKG:    NSR with nonspecific IVCD and inferolateral ST changes and occasional PVC's  ASSESSMENT AND PLAN:  1. Abnormal EKG with diffuse ST changes and IVCD - apparently had a cath that was normal 4 years ago - I will get the old records of prior EKG and cath report 2. HTN well controlled but now with orthostasis - continue metoprolol - stop ramipril 3. Bileaflet MVP by echo 2009 - check 2D echo to assess MVP 4.  Palpitations  -30 day heart monitor to assess for arrhythmias 5.  Dizziness with  orthostasis on exam - I have asked her to stop her Altace and increase her fluid intake to 64oz of fluid daily and liberalize her salt intake - I have given her a prescription for TED hose stockings  Followup with me in 4 weeks  Signed, Fransico Him, MD 02/01/2014 8:15 AM

## 2014-02-01 NOTE — Addendum Note (Signed)
Addended by: Lily Kocher on: 02/01/2014 09:19 AM   Modules accepted: Orders

## 2014-02-01 NOTE — Patient Instructions (Addendum)
Your physician has recommended you make the following change in your medication: 1. Stop Ramipril  Your physician has recommended that you wear an event monitor. Event monitors are medical devices that record the heart's electrical activity. Doctors most often [name] these monitors to diagnose arrhythmias. Arrhythmias are problems with the speed or rhythm of the heartbeat. The monitor is a small, portable device. You can wear one while you do your normal daily activities. This is usually used to diagnose what is causing palpitations/syncope (passing out).  Your physician has requested that you have an echocardiogram. Echocardiography is a painless test that uses sound waves to create images of your heart. It provides your doctor with information about the size and shape of your heart and how well your heart's chambers and valves are working. This procedure takes approximately one hour. There are no restrictions for this procedure.  Your physician recommends that you schedule a follow-up appointment in: 4 weeks with Dr Radford Pax

## 2014-02-02 ENCOUNTER — Encounter: Payer: Self-pay | Admitting: *Deleted

## 2014-02-02 ENCOUNTER — Encounter (INDEPENDENT_AMBULATORY_CARE_PROVIDER_SITE_OTHER): Payer: Medicare Other

## 2014-02-02 ENCOUNTER — Ambulatory Visit (HOSPITAL_COMMUNITY): Payer: Medicare Other | Attending: Cardiology | Admitting: Radiology

## 2014-02-02 DIAGNOSIS — Z8673 Personal history of transient ischemic attack (TIA), and cerebral infarction without residual deficits: Secondary | ICD-10-CM | POA: Diagnosis not present

## 2014-02-02 DIAGNOSIS — C8589 Other specified types of non-Hodgkin lymphoma, extranodal and solid organ sites: Secondary | ICD-10-CM | POA: Insufficient documentation

## 2014-02-02 DIAGNOSIS — I059 Rheumatic mitral valve disease, unspecified: Secondary | ICD-10-CM | POA: Insufficient documentation

## 2014-02-02 DIAGNOSIS — I341 Nonrheumatic mitral (valve) prolapse: Secondary | ICD-10-CM

## 2014-02-02 DIAGNOSIS — I10 Essential (primary) hypertension: Secondary | ICD-10-CM | POA: Insufficient documentation

## 2014-02-02 DIAGNOSIS — R002 Palpitations: Secondary | ICD-10-CM | POA: Diagnosis not present

## 2014-02-02 DIAGNOSIS — I519 Heart disease, unspecified: Secondary | ICD-10-CM | POA: Insufficient documentation

## 2014-02-02 NOTE — Progress Notes (Signed)
Echocardiogram performed.  

## 2014-02-02 NOTE — Progress Notes (Signed)
Patient ID: [name], female   DOB: 02-08-1934, 80 y.o.   MRN: [id number] E-Cardio verite 30 day cardiac event monitor applied to patient. (no lifewatch monitors available)

## 2014-02-03 ENCOUNTER — Emergency Department (HOSPITAL_COMMUNITY)
Admission: EM | Admit: 2014-02-03 | Discharge: 2014-02-03 | Disposition: A | Discharge status: Home / Self Care | Payer: Medicare Other | Attending: Emergency Medicine | Admitting: Emergency Medicine

## 2014-02-03 ENCOUNTER — Encounter (HOSPITAL_COMMUNITY): Payer: Self-pay | Admitting: Emergency Medicine

## 2014-02-03 DIAGNOSIS — R404 Transient alteration of awareness: Secondary | ICD-10-CM | POA: Insufficient documentation

## 2014-02-03 DIAGNOSIS — I10 Essential (primary) hypertension: Secondary | ICD-10-CM | POA: Diagnosis not present

## 2014-02-03 DIAGNOSIS — Z8739 Personal history of other diseases of the musculoskeletal system and connective tissue: Secondary | ICD-10-CM | POA: Insufficient documentation

## 2014-02-03 DIAGNOSIS — Z8673 Personal history of transient ischemic attack (TIA), and cerebral infarction without residual deficits: Secondary | ICD-10-CM | POA: Insufficient documentation

## 2014-02-03 DIAGNOSIS — Z79899 Other long term (current) drug therapy: Secondary | ICD-10-CM | POA: Insufficient documentation

## 2014-02-03 DIAGNOSIS — R55 Syncope and collapse: Secondary | ICD-10-CM | POA: Insufficient documentation

## 2014-02-03 DIAGNOSIS — I951 Orthostatic hypotension: Secondary | ICD-10-CM

## 2014-02-03 DIAGNOSIS — R5381 Other malaise: Secondary | ICD-10-CM | POA: Diagnosis not present

## 2014-02-03 HISTORY — DX: Thoracic aortic ectasia: I77.810

## 2014-02-03 HISTORY — DX: Essential (primary) hypertension: I10

## 2014-02-03 HISTORY — DX: Nonrheumatic mitral (valve) prolapse: I34.1

## 2014-02-03 HISTORY — DX: Non-Hodgkin lymphoma, unspecified, unspecified site: C85.90

## 2014-02-03 HISTORY — DX: Nonrheumatic mitral (valve) insufficiency: I34.0

## 2014-02-03 HISTORY — DX: Other specified abnormal findings of blood chemistry: R79.89

## 2014-02-03 MED ORDER — SODIUM CHLORIDE 0.9 % IV BOLUS (SEPSIS)
1000.0000 mL | Freq: Once | INTRAVENOUS | Status: AC
  Administered 2014-02-03: 1000 mL via INTRAVENOUS

## 2014-02-03 MED ORDER — ONDANSETRON HCL 4 MG/2ML IJ SOLN
4.0000 mg | Freq: Once | INTRAMUSCULAR | Status: AC
  Administered 2014-02-03: 4 mg via INTRAVENOUS
  Filled 2014-02-03: qty 2

## 2014-02-03 NOTE — ED Provider Notes (Signed)
CSN: [id number]     Arrival date & time 02/03/14  1301 History   First MD Initiated Contact with Patient 02/03/14 1311     Chief Complaint  Patient presents with  . Loss of Consciousness     (Consider location/radiation/quality/duration/timing/severity/associated sxs/prior Treatment) The history is provided by the patient and medical records.    Patient with hx mitral regurgitation, orthostasis, palpitations, seen yesterday by cardiology Fransico Him) and fitted with cardiac event monitor and echocardiogram performed, presents with syncope.  Pt states she was taken off most of her blood pressure medication yesterday but forgot and took all of her medication this morning.  States she also did not drink much water.  She then went to the grocery store and became lightheaded while standing in line and then passed out.  Denies hitting head, denies any pain anywhere.  Notes some nausea from protein drink she had this morning, states this is not atypical for her.  Denies fever, recent illness, sore throat, cough, abdominal pain, urinary symptoms, change in bowels.  Denies dizziness or focal neurologic deficits.  Denies CP, SOB.   Notes she is thirsty. Also notes she does not eat very well, does not have much appetite since being treated for gastric cancer and frequently forgets to eat.    EMS noted systolic BP 88 on their arrival.    Past Medical History  Diagnosis Date  . Headache   . High blood pressure   . Osteoporosis   . TIA (transient ischemic attack)    Past Surgical History  Procedure Laterality Date  . Cataract extraction    . Oral tooth     Family History  Problem Relation Age of Onset  . Coronary artery disease Father 3  . Brain cancer Sister    History  Substance Use Topics  . Smoking status: Never Smoker   . Smokeless tobacco: Never Used  . Alcohol Use: No   OB History   Grav Para Term Preterm Abortions TAB SAB Ect Mult Living                 Review of Systems   All other systems reviewed and are negative.     Allergies  Biaxin  Home Medications   Prior to Admission medications   Medication Sig Start Date End Date Taking? Authorizing Provider  atorvastatin (LIPITOR) 10 MG tablet Take 10 mg by mouth daily.  01/31/14   Doe-Hyun R Shawna Orleans, DO  escitalopram (LEXAPRO) 5 MG tablet Take 1 tablet (5 mg total) by mouth daily. 01/31/14   Doe-Hyun R Shawna Orleans, DO  metoprolol succinate (TOPROL XL) 25 MG 24 hr tablet Take 0.5 tablets (12.5 mg total) by mouth daily. 01/31/14   Doe-Hyun R Shawna Orleans, DO   BP 129/69  Pulse 62  Temp(Src) 97.5 F (36.4 C) (Oral)  Resp 12  SpO2 93% Physical Exam  Nursing note and vitals reviewed. Constitutional: She appears well-developed and well-nourished. No distress.  HENT:  Head: Normocephalic and atraumatic.  Neck: Normal range of motion. Neck supple.  Cardiovascular: Normal rate, regular rhythm and intact distal pulses.   Pulmonary/Chest: Effort normal and breath sounds normal. No respiratory distress. She has no wheezes. She has no rales.  Abdominal: Soft. She exhibits no distension. There is no tenderness. There is no rebound and no guarding.  Musculoskeletal: She exhibits no edema.  Neurological: She is alert.  Skin: She is not diaphoretic.  Psychiatric: She has a normal mood and affect. Her behavior is normal. Thought content normal.  ED Course  Procedures (including critical care time) Labs Review Labs Reviewed - No data to display  Imaging Review No results found.   EKG Interpretation None       Date: 02/03/2014  Rate: 61  Rhythm: normal sinus rhythm  QRS Axis: normal  Intervals: normal  ST/T Wave abnormalities: nonspecific T wave changes  Conduction Disutrbances:none  Narrative Interpretation: "anteroseptal infarct, old"   Old EKG Reviewed: unchanged    1:46 PM Discussed pt with Dr Eulis Foster.   3:26 PM Cardiac event monitor shows no abnormal events since placement.   3:39 PM Patient reports she is  feeling very well.  Discussed with cardiology PA - there was a miscommunication between our nursing staff and their card master regarding whether we needed a consult or not.  There seems to be a clear reason for this patient's syncope and there were no events on her monitor.  Pt hydrated in ED and orthostatics improved.  Pt advised to use pill box or whatever method might help her remember her medication changes and also to increase PO intake.   Plan for d/c home.   MDM   Final diagnoses:  Orthostatic syncope    Afebrile, nontoxic patient with episode of syncope after accidentally taking HTN medications that she had been taken off of due to orthostasis.  Pt also did not drink or eat much this morning.  Felt lightheaded prior to episode.  Denies any pain or injury from syncope, reports to have been lowered down by people around her.  Hypotensive on EMS arrival and orthostatic initially here, improved with fluids.  Cardiac event monitor in place showed no events per cardiology. Pt feeling well throughout ED stay.    D/C home with PCP and cardiology follow up.  Discussed findings, treatment, and follow up  with patient.  Pt given return precautions.  Pt verbalizes understanding and agrees with plan.         [place], PA-C 02/03/14 [phone]

## 2014-02-03 NOTE — ED Provider Notes (Signed)
  Face-to-face evaluation   History: She had syncope with prodrome of weakness today. Symptoms are recurrent  Physical exam: Alert, elderly, female, calm, cooperative. Strength. Normal, arms, and legs, bilaterally. She is lucid  Medical screening examination/treatment/procedure(s) were conducted as a shared visit with non-physician practitioner(s) and myself.  I personally evaluated the patient during the encounter  Richarda Blade, MD 02/03/14 [phone]

## 2014-02-03 NOTE — Discharge Instructions (Signed)
Read the information below.  You may return to the Emergency Department at any time for worsening condition or any new symptoms that concern you.  Please remember the medication changes Dr Radford Pax made yesterday.  Drink plenty of fluids and eat well over the next few days.  If you develop dizziness, severe lightheadedness, chest pain, shortness of breath, or you pass out again, please call 911 and return to the ER immediately for a recheck.     Orthostatic Hypotension Orthostatic hypotension is a sudden drop in blood pressure. It happens when you quickly stand up from a seated or lying position. You may feel dizzy or light-headed. This can last for just a few seconds or for up to a few minutes. It is usually not a serious problem. However, if this happens frequently or gets worse, it can be a sign of something more serious. CAUSES  Different things can cause orthostatic hypotension, including:   Loss of body fluids (dehydration).  Medicines that lower blood pressure.  Sudden changes in posture, such as standing up quickly after you have been sitting or lying down.  Taking too much of your medicine. SIGNS AND SYMPTOMS   Light-headedness or dizziness.   Fainting or near-fainting.   A fast heart rate.   Weakness.   Feeling tired (fatigue).  DIAGNOSIS  Your health care provider may do several things to help diagnose your condition and identify the cause. These may include:   Taking a medical history and doing a physical exam.  Checking your blood pressure. Your health care provider will check your blood pressure when you are:  Lying down.  Sitting.  Standing.  Using tilt table testing. In this test, you lie down on a table that moves from a lying position to a standing position. You will be strapped onto the table. This test monitors your blood pressure and heart rate when you are in different positions. TREATMENT  Treatment will vary depending on the cause. Possible  treatments include:   Changing the dosage of your medicines.  Wearing compression stockings on your lower legs.  Standing up slowly after sitting or lying down.  Eating more salt.  Eating frequent, small meals.  In some cases, getting IV fluids.  Taking medicine to enhance fluid retention. HOME CARE INSTRUCTIONS  Only take over-the-counter or prescription medicines as directed by your health care provider.  Follow your health care provider's instructions for changing the dosage of your current medicines.  Do not stop or adjust your medicine on your own.  Stand up slowly after sitting or lying down. This allows your body to adjust to the different position.  Wear compression stockings as directed.  Eat extra salt as directed.  Do not add extra salt to your diet unless directed to by your health care provider.  Eat frequent, small meals.  Avoid standing suddenly after eating.  Avoid hot showers or excessive heat as directed by your health care provider.  Keep all follow-up appointments. SEEK MEDICAL CARE IF:  You continue to feel dizzy or light-headed after standing.  You feel groggy or confused.  You feel cold, clammy, or sick to your stomach (nauseous).  You have blurred vision.  You feel short of breath. SEEK IMMEDIATE MEDICAL CARE IF:   You faint after standing.  You have chest pain.  You have difficulty breathing.   You lose feeling or movement in your arms or legs.   You have slurred speech or difficulty talking, or you are unable to talk.  MAKE SURE YOU:   Understand these instructions.  Will watch your condition.  Will get help right away if you are not doing well or get worse. Document Released: 05/09/2002 Document Revised: 05/24/2013 Document Reviewed: 03/11/2013 [facility] Patient Information 2015 [place], [place]. This information is not intended to replace advice given to you by your health care provider. Make sure you discuss any  questions you have with your health care provider.  Syncope Syncope is a medical term for fainting or passing out. This means you lose consciousness and drop to the ground. People are generally unconscious for less than 5 minutes. You may have some muscle twitches for up to 15 seconds before waking up and returning to normal. Syncope occurs more often in older adults, but it can happen to anyone. While most causes of syncope are not dangerous, syncope can be a sign of a serious medical problem. It is important to seek medical care.  CAUSES  Syncope is caused by a sudden drop in blood flow to the brain. The specific cause is often not determined. Factors that can bring on syncope include:  Taking medicines that lower blood pressure.  Sudden changes in posture, such as standing up quickly.  Taking more medicine than prescribed.  Standing in one place for too long.  Seizure disorders.  Dehydration and excessive exposure to heat.  Low blood sugar (hypoglycemia).  Straining to have a bowel movement.  Heart disease, irregular heartbeat, or other circulatory problems.  Fear, emotional distress, seeing blood, or severe pain. SYMPTOMS  Right before fainting, you may:  Feel dizzy or light-headed.  Feel nauseous.  See all white or all black in your field of vision.  Have cold, clammy skin. DIAGNOSIS  Your health care provider will ask about your symptoms, perform a physical exam, and perform an electrocardiogram (ECG) to record the electrical activity of your heart. Your health care provider may also perform other heart or blood tests to determine the cause of your syncope which may include:  Transthoracic echocardiogram (TTE). During echocardiography, sound waves are used to evaluate how blood flows through your heart.  Transesophageal echocardiogram (TEE).  Cardiac monitoring. This allows your health care provider to monitor your heart rate and rhythm in real time.  Holter  monitor. This is a portable device that records your heartbeat and can help diagnose heart arrhythmias. It allows your health care provider to track your heart activity for several days, if needed.  Stress tests by exercise or by giving medicine that makes the heart beat faster. TREATMENT  In most cases, no treatment is needed. Depending on the cause of your syncope, your health care provider may recommend changing or stopping some of your medicines. HOME CARE INSTRUCTIONS  Have someone stay with you until you feel stable.  Do not drive, use machinery, or play sports until your health care provider says it is okay.  Keep all follow-up appointments as directed by your health care provider.  Lie down right away if you start feeling like you might faint. Breathe deeply and steadily. Wait until all the symptoms have passed.  Drink enough fluids to keep your urine clear or pale yellow.  If you are taking blood pressure or heart medicine, get up slowly and take several minutes to sit and then stand. This can reduce dizziness. SEEK IMMEDIATE MEDICAL CARE IF:   You have a severe headache.  You have unusual pain in the chest, abdomen, or back.  You are bleeding from your mouth or rectum,  or you have black or tarry stool.  You have an irregular or very fast heartbeat.  You have pain with breathing.  You have repeated fainting or seizure-like jerking during an episode.  You faint when sitting or lying down.  You have confusion.  You have trouble walking.  You have severe weakness.  You have vision problems. If you fainted, call your local emergency services (911 in [place] Do not drive yourself to the hospital.  MAKE SURE YOU:  Understand these instructions.  Will watch your condition.  Will get help right away if you are not doing well or get worse. Document Released: 05/19/2005 Document Revised: 05/24/2013 Document Reviewed: 07/18/2011 [facility] Patient Information 2015  [place], [place]. This information is not intended to replace advice given to you by your health care provider. Make sure you discuss any questions you have with your health care provider.

## 2014-02-03 NOTE — ED Notes (Signed)
Per EMS, Patient was standing in line at the grocery store and had a syncopal episode that lasted thirty minutes. Patient was lowered to the ground by surrounding bystanders. Patient had no injury noted from the fall. Patient awake and alert x4 upon arrival. Patient presents with a heart monitor placed by cardiologist yesterday. Patient complaining of nausea. Patient states, "The MD took me off of my BP medicine yesterday, but forgot this morning and took it anyway." Vitals per EMS: 88 palpation sitting initial, 1116/70, 64 HR, 18 RR. 110 CBG.

## 2014-02-03 NOTE — Progress Notes (Addendum)
Called by ER for input regarding monitor. Discussed monitor with our office - event monitor interrogation did not demonstrate any arrhythmias. Nothing significant since this was placed yesterday. No alarms reported. ER made aware. Jakyrie Totherow PA-C

## 2014-02-08 ENCOUNTER — Telehealth: Payer: Self-pay | Admitting: Cardiology

## 2014-02-08 ENCOUNTER — Other Ambulatory Visit: Payer: Self-pay | Admitting: General Surgery

## 2014-02-08 DIAGNOSIS — I77810 Thoracic aortic ectasia: Secondary | ICD-10-CM

## 2014-02-08 NOTE — Telephone Encounter (Signed)
Left detailed message x 2 for pt to call our office in regards to serious notification on her event monitor from 9/8 at 5:37 pm.  Pt has an event monitor ordered by Dr Radford Pax, and a serious report was faxed to our office today showing the pt had an episode yesterday 9/8, showing recorded event of sinus rhythm w/Mutifocal PVCs at 5:37 pm.  No other recorded events after that.  Showed DOD Dr Tamala Julian and he reviewed this and made no changes, just continue to monitor.  Will forward to Dr Radford Pax and CMA for further review and follow-up.

## 2014-02-08 NOTE — Telephone Encounter (Signed)
New message ° ° ° ° ° °Returning a nurses call from yesterday °

## 2014-02-08 NOTE — Telephone Encounter (Signed)
ROI axed to [employer] at [phone]   9.9.15/km

## 2014-02-08 NOTE — Telephone Encounter (Signed)
To Dr Turner to advise 

## 2014-02-08 NOTE — Telephone Encounter (Signed)
Spoke to pt and gave results of echo

## 2014-02-09 NOTE — Telephone Encounter (Signed)
Patient to continue heart monitor which thus far has showed multifocal PVC's and PAC's

## 2014-02-09 NOTE — Telephone Encounter (Signed)
Pt made aware

## 2014-02-13 ENCOUNTER — Telehealth: Payer: Self-pay | Admitting: Cardiology

## 2014-02-13 ENCOUNTER — Encounter: Payer: Self-pay | Admitting: General Surgery

## 2014-02-13 NOTE — Telephone Encounter (Signed)
Gave pt new Activation code.

## 2014-02-13 NOTE — Telephone Encounter (Signed)
New message     Pt need another number to get into mychart.

## 2014-02-28 ENCOUNTER — Emergency Department (HOSPITAL_COMMUNITY): Payer: Medicare Other

## 2014-02-28 ENCOUNTER — Telehealth: Payer: Self-pay | Admitting: Physician Assistant

## 2014-02-28 ENCOUNTER — Inpatient Hospital Stay (HOSPITAL_COMMUNITY)
Admission: EM | Admit: 2014-02-28 | Discharge: 2014-03-04 | DRG: 308 | Disposition: A | Discharge status: Home Health | Payer: Medicare Other | Attending: Internal Medicine | Admitting: Internal Medicine

## 2014-02-28 ENCOUNTER — Encounter (HOSPITAL_COMMUNITY): Payer: Self-pay | Admitting: Emergency Medicine

## 2014-02-28 DIAGNOSIS — I340 Nonrheumatic mitral (valve) insufficiency: Secondary | ICD-10-CM | POA: Diagnosis present

## 2014-02-28 DIAGNOSIS — I10 Essential (primary) hypertension: Secondary | ICD-10-CM | POA: Diagnosis present

## 2014-02-28 DIAGNOSIS — R269 Unspecified abnormalities of gait and mobility: Secondary | ICD-10-CM | POA: Diagnosis present

## 2014-02-28 DIAGNOSIS — M810 Age-related osteoporosis without current pathological fracture: Secondary | ICD-10-CM | POA: Diagnosis present

## 2014-02-28 DIAGNOSIS — S0990XA Unspecified injury of head, initial encounter: Secondary | ICD-10-CM | POA: Diagnosis not present

## 2014-02-28 DIAGNOSIS — R55 Syncope and collapse: Secondary | ICD-10-CM | POA: Diagnosis not present

## 2014-02-28 DIAGNOSIS — S0100XA Unspecified open wound of scalp, initial encounter: Secondary | ICD-10-CM | POA: Diagnosis not present

## 2014-02-28 DIAGNOSIS — S0180XA Unspecified open wound of other part of head, initial encounter: Secondary | ICD-10-CM | POA: Diagnosis not present

## 2014-02-28 DIAGNOSIS — E785 Hyperlipidemia, unspecified: Secondary | ICD-10-CM | POA: Diagnosis not present

## 2014-02-28 DIAGNOSIS — I498 Other specified cardiac arrhythmias: Secondary | ICD-10-CM | POA: Diagnosis not present

## 2014-02-28 DIAGNOSIS — Z8673 Personal history of transient ischemic attack (TIA), and cerebral infarction without residual deficits: Secondary | ICD-10-CM | POA: Diagnosis not present

## 2014-02-28 DIAGNOSIS — R42 Dizziness and giddiness: Secondary | ICD-10-CM | POA: Diagnosis not present

## 2014-02-28 DIAGNOSIS — I471 Supraventricular tachycardia: Principal | ICD-10-CM | POA: Diagnosis present

## 2014-02-28 DIAGNOSIS — I059 Rheumatic mitral valve disease, unspecified: Secondary | ICD-10-CM | POA: Diagnosis not present

## 2014-02-28 DIAGNOSIS — S0181XA Laceration without foreign body of other part of head, initial encounter: Secondary | ICD-10-CM | POA: Diagnosis present

## 2014-02-28 DIAGNOSIS — S02109A Fracture of base of skull, unspecified side, initial encounter for closed fracture: Secondary | ICD-10-CM | POA: Diagnosis not present

## 2014-02-28 DIAGNOSIS — S0219XA Other fracture of base of skull, initial encounter for closed fracture: Secondary | ICD-10-CM | POA: Diagnosis present

## 2014-02-28 DIAGNOSIS — R002 Palpitations: Secondary | ICD-10-CM

## 2014-02-28 DIAGNOSIS — I341 Nonrheumatic mitral (valve) prolapse: Secondary | ICD-10-CM | POA: Diagnosis not present

## 2014-02-28 DIAGNOSIS — W19XXXA Unspecified fall, initial encounter: Secondary | ICD-10-CM | POA: Diagnosis present

## 2014-02-28 DIAGNOSIS — I951 Orthostatic hypotension: Secondary | ICD-10-CM | POA: Diagnosis present

## 2014-02-28 DIAGNOSIS — S0285XA Fracture of orbit, unspecified, initial encounter for closed fracture: Secondary | ICD-10-CM

## 2014-02-28 DIAGNOSIS — S0993XA Unspecified injury of face, initial encounter: Secondary | ICD-10-CM | POA: Diagnosis not present

## 2014-02-28 DIAGNOSIS — S0280XA Fracture of other specified skull and facial bones, unspecified side, initial encounter for closed fracture: Secondary | ICD-10-CM | POA: Diagnosis not present

## 2014-02-28 DIAGNOSIS — Z681 Body mass index (BMI) 19 or less, adult: Secondary | ICD-10-CM | POA: Diagnosis not present

## 2014-02-28 DIAGNOSIS — C8590 Non-Hodgkin lymphoma, unspecified, unspecified site: Secondary | ICD-10-CM | POA: Diagnosis present

## 2014-02-28 DIAGNOSIS — S06340A Traumatic hemorrhage of right cerebrum without loss of consciousness, initial encounter: Secondary | ICD-10-CM | POA: Diagnosis present

## 2014-02-28 DIAGNOSIS — R404 Transient alteration of awareness: Secondary | ICD-10-CM | POA: Diagnosis not present

## 2014-02-28 DIAGNOSIS — S199XXA Unspecified injury of neck, initial encounter: Secondary | ICD-10-CM | POA: Diagnosis not present

## 2014-02-28 DIAGNOSIS — S0280XB Fracture of other specified skull and facial bones, unspecified side, initial encounter for open fracture: Secondary | ICD-10-CM

## 2014-02-28 DIAGNOSIS — S0285XB Fracture of orbit, unspecified, initial encounter for open fracture: Secondary | ICD-10-CM | POA: Diagnosis present

## 2014-02-28 DIAGNOSIS — C8589 Other specified types of non-Hodgkin lymphoma, extranodal and solid organ sites: Secondary | ICD-10-CM | POA: Diagnosis not present

## 2014-02-28 LAB — URINALYSIS, ROUTINE W REFLEX MICROSCOPIC
Bilirubin Urine: NEGATIVE
Glucose, UA: NEGATIVE mg/dL
Hgb urine dipstick: NEGATIVE
Ketones, ur: NEGATIVE mg/dL
LEUKOCYTES UA: NEGATIVE
NITRITE: NEGATIVE
Protein, ur: NEGATIVE mg/dL
SPECIFIC GRAVITY, URINE: 1.004 — AB (ref 1.005–1.030)
Urobilinogen, UA: 0.2 mg/dL (ref 0.0–1.0)
pH: 7 (ref 5.0–8.0)

## 2014-02-28 LAB — CBC WITH DIFFERENTIAL/PLATELET
BASOS ABS: 0 10*3/uL (ref 0.0–0.1)
BASOS PCT: 1 % (ref 0–1)
Eosinophils Absolute: 0.1 10*3/uL (ref 0.0–0.7)
Eosinophils Relative: 1 % (ref 0–5)
HCT: 37.1 % (ref 36.0–46.0)
HEMOGLOBIN: 13.1 g/dL (ref 12.0–15.0)
Lymphocytes Relative: 42 % (ref 12–46)
Lymphs Abs: 2.1 10*3/uL (ref 0.7–4.0)
MCH: 32.6 pg (ref 26.0–34.0)
MCHC: 35.3 g/dL (ref 30.0–36.0)
MCV: 92.3 fL (ref 78.0–100.0)
MONO ABS: 0.4 10*3/uL (ref 0.1–1.0)
Monocytes Relative: 9 % (ref 3–12)
NEUTROS PCT: 47 % (ref 43–77)
Neutro Abs: 2.3 10*3/uL (ref 1.7–7.7)
Platelets: 143 10*3/uL — ABNORMAL LOW (ref 150–400)
RBC: 4.02 MIL/uL (ref 3.87–5.11)
RDW: 13.3 % (ref 11.5–15.5)
WBC: 4.9 10*3/uL (ref 4.0–10.5)

## 2014-02-28 LAB — COMPREHENSIVE METABOLIC PANEL
ALBUMIN: 3.3 g/dL — AB (ref 3.5–5.2)
ALT: 15 U/L (ref 0–35)
ANION GAP: 14 (ref 5–15)
AST: 34 U/L (ref 0–37)
Alkaline Phosphatase: 65 U/L (ref 39–117)
BUN: 6 mg/dL (ref 6–23)
CO2: 23 mEq/L (ref 19–32)
CREATININE: 0.74 mg/dL (ref 0.50–1.10)
Calcium: 9.1 mg/dL (ref 8.4–10.5)
Chloride: 95 mEq/L — ABNORMAL LOW (ref 96–112)
GFR calc Af Amer: >90 mL/min (ref >90)
GFR calc non Af Amer: 80 mL/min — ABNORMAL LOW (ref >90)
Glucose, Bld: 105 mg/dL — ABNORMAL HIGH (ref 70–99)
Potassium: 4.1 mEq/L (ref 3.7–5.3)
Sodium: 132 mEq/L — ABNORMAL LOW (ref 137–147)
TOTAL PROTEIN: 6.3 g/dL (ref 6.0–8.3)
Total Bilirubin: 0.5 mg/dL (ref 0.3–1.2)

## 2014-02-28 LAB — CBG MONITORING, ED: GLUCOSE-CAPILLARY: 120 mg/dL — AB (ref 70–99)

## 2014-02-28 LAB — TROPONIN I

## 2014-02-28 MED ORDER — LIDOCAINE HCL (PF) 1 % IJ SOLN
INTRAMUSCULAR | Status: AC
  Filled 2014-02-28: qty 5

## 2014-02-28 MED ORDER — SODIUM BICARBONATE 4 % IV SOLN
5.0000 mL | Freq: Once | INTRAVENOUS | Status: AC
  Administered 2014-02-28: 5 mL via SUBCUTANEOUS
  Filled 2014-02-28: qty 5

## 2014-02-28 MED ORDER — SODIUM CHLORIDE 0.9 % IV SOLN
INTRAVENOUS | Status: DC
  Administered 2014-03-01 – 2014-03-02 (×2): via INTRAVENOUS

## 2014-02-28 MED ORDER — ESCITALOPRAM OXALATE 5 MG PO TABS
5.0000 mg | ORAL_TABLET | Freq: Every day | ORAL | Status: DC
  Administered 2014-03-01 – 2014-03-04 (×4): 5 mg via ORAL
  Filled 2014-02-28 (×4): qty 1

## 2014-02-28 MED ORDER — ATORVASTATIN CALCIUM 10 MG PO TABS
10.0000 mg | ORAL_TABLET | Freq: Every day | ORAL | Status: DC
  Administered 2014-03-01 – 2014-03-03 (×3): 10 mg via ORAL
  Filled 2014-02-28 (×4): qty 1

## 2014-02-28 MED ORDER — LIDOCAINE HCL (PF) 1 % IJ SOLN
10.0000 mL | Freq: Once | INTRAMUSCULAR | Status: AC
  Administered 2014-02-28: 10 mL
  Filled 2014-02-28: qty 10

## 2014-02-28 MED ORDER — ACETAMINOPHEN 325 MG PO TABS
650.0000 mg | ORAL_TABLET | Freq: Four times a day (QID) | ORAL | Status: DC | PRN
  Administered 2014-03-01 – 2014-03-03 (×5): 650 mg via ORAL
  Filled 2014-02-28 (×5): qty 2

## 2014-02-28 MED ORDER — ONDANSETRON HCL 4 MG/2ML IJ SOLN: 4.0000 mg | Freq: Four times a day (QID) | INTRAMUSCULAR | Status: DC | PRN

## 2014-02-28 MED ORDER — SODIUM CHLORIDE 0.9 % IV SOLN
1000.0000 mL | Freq: Once | INTRAVENOUS | Status: AC
  Administered 2014-02-28: 1000 mL via INTRAVENOUS

## 2014-02-28 MED ORDER — ACETAMINOPHEN 650 MG RE SUPP: 650.0000 mg | Freq: Four times a day (QID) | RECTAL | Status: DC | PRN

## 2014-02-28 MED ORDER — INFLUENZA VAC SPLIT QUAD 0.5 ML IM SUSY
0.5000 mL | PREFILLED_SYRINGE | INTRAMUSCULAR | Status: AC
  Administered 2014-03-01: 0.5 mL via INTRAMUSCULAR
  Filled 2014-02-28: qty 0.5

## 2014-02-28 MED ORDER — MECLIZINE HCL 25 MG PO TABS
25.0000 mg | ORAL_TABLET | Freq: Three times a day (TID) | ORAL | Status: DC | PRN
  Administered 2014-03-01: 25 mg via ORAL
  Filled 2014-02-28: qty 1

## 2014-02-28 MED ORDER — ALUM & MAG HYDROXIDE-SIMETH 200-200-20 MG/5ML PO SUSP: 30.0000 mL | Freq: Four times a day (QID) | ORAL | Status: DC | PRN

## 2014-02-28 MED ORDER — SENNOSIDES-DOCUSATE SODIUM 8.6-50 MG PO TABS
1.0000 | ORAL_TABLET | Freq: Every evening | ORAL | Status: DC | PRN
  Filled 2014-02-28: qty 1

## 2014-02-28 MED ORDER — ONDANSETRON HCL 4 MG PO TABS: 4.0000 mg | ORAL_TABLET | Freq: Four times a day (QID) | ORAL | Status: DC | PRN

## 2014-02-28 MED ORDER — SODIUM CHLORIDE 0.9 % IJ SOLN
3.0000 mL | Freq: Two times a day (BID) | INTRAMUSCULAR | Status: DC
  Administered 2014-03-01 – 2014-03-03 (×2): 3 mL via INTRAVENOUS

## 2014-02-28 MED ORDER — MORPHINE SULFATE 2 MG/ML IJ SOLN: 1.0000 mg | INTRAMUSCULAR | Status: DC | PRN

## 2014-02-28 MED ORDER — MECLIZINE HCL 25 MG PO TABS
25.0000 mg | ORAL_TABLET | Freq: Once | ORAL | Status: AC
Start: 2014-02-28 — End: 2014-02-28
  Administered 2014-02-28: 25 mg via ORAL
  Filled 2014-02-28: qty 1

## 2014-02-28 NOTE — ED Notes (Signed)
CBG 120 

## 2014-02-28 NOTE — Progress Notes (Signed)
Patient is unable to tolerate standing for orthostatic vital signs related to dizziness and vertigo.  No adverse cardiac rhythms noted on telemetry at this point.  Will reassess tolerance with next round.  Continuing to monitor.

## 2014-02-28 NOTE — ED Notes (Signed)
Pt states that laying down or any position change "causes that vertigo-- I can't change positions." pt c/o nausea at present-- "from dizziness"

## 2014-02-28 NOTE — ED Notes (Signed)
Unable to ambulate due to dizziness, unable to complete orthostatics due to dizziness-- MD aware

## 2014-02-28 NOTE — Progress Notes (Signed)
Patient still complains of positional vertigo.  Unable to obtain orthostatic vital signs at this time.  Continuing to monitor.

## 2014-02-28 NOTE — ED Notes (Signed)
Completed orthostatics lying down and sitting up; however, the pt stated that she would rather not stand up at the moment.

## 2014-02-28 NOTE — ED Provider Notes (Signed)
CSN: [id number]     Arrival date & time 02/28/14  1243 History   First MD Initiated Contact with Patient 02/28/14 1249     Chief Complaint  Patient presents with  . Head Laceration  . Near Syncope     (Consider location/radiation/quality/duration/timing/severity/associated sxs/prior Treatment) HPI [name] is a 80 y.o. female who presents today after she was standing in line at [employer] and had a syncopal episode where she hit her head. Notes this same thing happened a month ago. She has a history of low blood pressure which she states is why she will faint.  However the pateint is on  toprol xl . Today she noticed she started feeling light headed before she fainted. Patient notes pain around left eye where she has a laceration from falling today. She denies nausea, vomiting, blurry vision or confusion. She denies melena or hematochezia. She has  Sig cardiac history as noted below, however she has had cardiac work up with holter monitoring that was negative.  Past Medical History  Diagnosis Date  . HTN (hypertension)   . Osteoporosis   . TIA (transient ischemic attack)   . Non-Hodgkin lymphoma     a. Tx with high dose chemotherapy at [facility] years ago. Pt reported  associated heart valve damage from chemotherapy.  . Mitral valve prolapse     a. Echo 01/2014: mild MVP.  Marland Kitchen Mitral regurgitation     a. Echo 01/2014: mod MR.  . Ascending aorta dilation     a. Echo 01/2014: mildly dilated.  . Abnormal TSH     a. Labs 01/2014:  low TSH 0.26 with free T4 1.07.   Past Surgical History  Procedure Laterality Date  . Cataract extraction    . Oral tooth     Family History  Problem Relation Age of Onset  . Coronary artery disease Father 61  . Brain cancer Sister    History  Substance Use Topics  . Smoking status: Never Smoker   . Smokeless tobacco: Never Used  . Alcohol Use: No   OB History   Grav Para Term Preterm Abortions TAB SAB Ect Mult Living                 Review of  Systems  Ten systems reviewed and are negative for acute change, except as noted in the HPI.    Allergies  Biaxin  Home Medications   Prior to Admission medications   Medication Sig Start Date End Date Taking? Authorizing Provider  atorvastatin (LIPITOR) 10 MG tablet Take 10 mg by mouth daily.  01/31/14   Doe-Hyun R Shawna Orleans, DO  escitalopram (LEXAPRO) 5 MG tablet Take 1 tablet (5 mg total) by mouth daily. 01/31/14   Doe-Hyun R Shawna Orleans, DO  metoprolol succinate (TOPROL XL) 25 MG 24 hr tablet Take 0.5 tablets (12.5 mg total) by mouth daily. 01/31/14   Doe-Hyun R Shawna Orleans, DO  Polyethyl Glycol-Propyl Glycol (SYSTANE OP) Place 1 drop into both eyes 3 (three) times daily as needed (dry eyes).    Historical Provider, MD  ramipril (ALTACE) 10 MG capsule Take 10 mg by mouth daily.    Historical Provider, MD   There were no vitals taken for this visit. Physical Exam  Constitutional: She is oriented to person, place, and time. She appears well-developed and well-nourished. No distress.  HENT:  Head: Normocephalic.    Ecchymosis around the R eye. EOMI and no pain with mvmt. No signs of entrapment.  There is a 3  x 5 cm gaping laceration of the R eyebrow.  Eyes: Conjunctivae and EOM are normal. Pupils are equal, round, and reactive to light. No scleral icterus.  Neck: Normal range of motion.  Cardiovascular: Normal rate, regular rhythm and normal heart sounds.  Exam reveals no gallop and no friction rub.   No murmur heard. Pulmonary/Chest: Effort normal and breath sounds normal. No respiratory distress.  Abdominal: Soft. Bowel sounds are normal. She exhibits no distension and no mass. There is no tenderness. There is no guarding.  Neurological: She is alert and oriented to person, place, and time.  Skin: Skin is warm and dry. She is not diaphoretic.    ED Course  Procedures (including critical care time) Labs Review Labs Reviewed - No data to display  Imaging Review No results found.   EKG  Interpretation   Date/Time:  Tuesday February 28 2014 12:54:44 EDT Ventricular Rate:  53 PR Interval:  181 QRS Duration: 103 QT Interval:  507 QTC Calculation: 476 R Axis:   -24 Text Interpretation:  Sinus rhythm Atrial premature complex Borderline  left axis deviation st abnormalities diffusely Abnormal ekg since last  tracing no significant change Confirmed by Sabra Heck  MD, BRIAN ([id number]) on  02/28/2014 4:30:04 PM      LACERATION REPAIR Performed by: Margarita Mail Authorized by: Margarita Mail Consent: Verbal consent obtained. Risks and benefits: risks, benefits and alternatives were discussed Consent given by: patient Patient identity confirmed: provided demographic data Prepped and Draped in normal sterile fashion Wound explored  Laceration Location: brow  Laceration Length: 6 cm  No Foreign Bodies seen or palpated  Anesthesia: local infiltration  Local anesthetic: lidocaine 1 % w/o epinephrine  Anesthetic total: 5 ml  Irrigation method: syringe Amount of cleaning: standard  Skin closure: 6.0 prolene  Number of sutures: 12  Technique: si, half buried  Patient tolerance: Patient tolerated the procedure well with no immediate complications.  MDM   Final diagnoses:  Syncope and collapse  Orbital fracture, closed, initial encounter    4:41 PM BP 121/56  Pulse 57  Temp(Src) 97.5 F (36.4 C) (Oral)  Resp 16  SpO2 98%  With an orbital floor fracture. I have spoken with Dr Benjamine Mola who states that the fracture is stable and she can be seen as an op.  The patient's ekg appears unchanged from previous. patietn will be admitted for observation.  Margarita Mail, PA-C 03/02/14 2145

## 2014-02-28 NOTE — ED Notes (Signed)
Attempted orthostatics. Pt nauseated. Will try again in a few minutes.

## 2014-02-28 NOTE — ED Notes (Addendum)
To ED via [facility] with c/o near syncopal episode at grocery store while standing in line, collapsed-- denies any LOC-- but hit forehead on floor. Laceration over right eye. Alert/oriented x 3, moves all extremities. Same thing happened 02/03/14

## 2014-02-28 NOTE — Telephone Encounter (Signed)
[employer] cardiac monitoring called to notify us of a patient-activated trigger for syncope from earlier this afternoon. Strips during this time showed NSR per cardiac monitor company. The patient has already presented to the ER for evaluation and workup is underway per preliminary notes. Lyndle Pang PA-C

## 2014-02-28 NOTE — ED Notes (Signed)
PA at the bedside to suture.

## 2014-02-28 NOTE — H&P (Addendum)
[facility] History and Physical  Apoorva Bugay UXL:[id number] DOB: 1934/05/11 DOA: 02/28/2014  Referring physician: EDP PCP: Drema Pry, DO   Chief Complaint:   HPI: [name] is a 80 y.o. female  With h/o recurrent syncope, non-Hodgkins lymphoma, MVP, vertigo presents after syncope and head injury. Felt light headed while standing in line at grocery store. Felt lightheaded prior to fall. "Like blood pressure was low". Seen by Dr. Radford Pax this month for recurrent syncope. Has event monitor. Just PACs per monitor tech. Golden [place] forward and struck her head. Had a right brow laceration that has been repaired by ED staff.  Maxillofacial CT shows small nondisplaced fracture involving anterior portion of right orbital roof and posterior frontal sinus with small amount of hemorrhage within right frontal sinus.  EDP spoke with ENT on call, Dr. Benjamine Mola, who recommended outpatient follow up in his office. Felt "vertigo" yesterday. Had recent echo which showed moderate TR. H/o orthostasis and ace inhibitor stopped recently. Tried to ambulate in ED, very unsteady gait and has nausea and dizziness.   Review of Systems:  Systems reviewed. As above otherwise negavie   Past Medical History  Diagnosis Date  . HTN (hypertension)   . Osteoporosis   . TIA (transient ischemic attack)   . Mitral valve prolapse     a. Echo 01/2014: mild MVP.  Marland Kitchen Mitral regurgitation     a. Echo 01/2014: mod MR.  . Ascending aorta dilation     a. Echo 01/2014: mildly dilated.  . Abnormal TSH     a. Labs 01/2014:  low TSH 0.26 with free T4 1.07.  . Non-Hodgkin lymphoma     a. Tx with high dose chemotherapy at [facility] years ago. Pt reported  associated heart valve damage from chemotherapy.   Past Surgical History  Procedure Laterality Date  . Cataract extraction    . Oral tooth     Social History:  reports that she has never smoked. She has never used smokeless tobacco. She reports that she does not drink alcohol or use  illicit drugs.  Allergies  Allergen Reactions  . Biaxin [Clarithromycin] Other (See Comments)    Severe stomach burning    Family History  Problem Relation Age of Onset  . Coronary artery disease Father 42  . Brain cancer Sister      Prior to Admission medications   Medication Sig Start Date End Date Taking? Authorizing Provider  atorvastatin (LIPITOR) 10 MG tablet Take 10 mg by mouth daily.  01/31/14  Yes Doe-Hyun R Shawna Orleans, DO  escitalopram (LEXAPRO) 5 MG tablet Take 1 tablet (5 mg total) by mouth daily. 01/31/14  Yes Doe-Hyun R Shawna Orleans, DO  metoprolol succinate (TOPROL XL) 25 MG 24 hr tablet Take 18.75 mg by mouth daily.  01/31/14  Yes Doe-Hyun Kyra Searles, DO  Polyethyl Glycol-Propyl Glycol (SYSTANE OP) Place 1 drop into both eyes 3 (three) times daily as needed (dry eyes).   Yes Historical Provider, MD  ramipril (ALTACE) 5 MG capsule Take 5 mg by mouth daily.   Yes Historical Provider, MD   Physical Exam: Filed Vitals:   02/28/14 1255 02/28/14 1501 02/28/14 1545  BP: 113/55 127/66 121/56  Pulse:   57  Temp: 97.5 F (36.4 C)    TempSrc: Oral    Resp: 17 16   SpO2: 99% 100% 98%    Wt Readings from Last 3 Encounters:  02/01/14 46.321 kg (102 lb 1.9 oz)  01/31/14 45.813 kg (101 lb)  12/14/13 47.174 kg (104 lb)  BP 131/67  Pulse 63  Temp(Src) 97.9 F (36.6 C) (Oral)  Resp 18  SpO2 97%  General Appearance:    Alert, cooperative, no distress, appears stated age  Head:    Right brow with sutures  Eyes:    PERRL, conjunctiva/corneas clear, EOM's intact, periorbittal echymoses. Laceration with sutures  Ears:    Normal TM's and external ear canals, both ears  Nose:   Nares normal, septum midline, mucosa normal, no drainage    or sinus tenderness  Throat:   Lips, mucosa, and tongue normal; teeth and gums normal  Neck:   Supple, symmetrical, trachea midline, no adenopathy;    thyroid:  no enlargement/tenderness/nodules; no carotid   bruit or JVD  Back:     Symmetric, no curvature, ROM  normal, no CVA tenderness  Lungs:     Clear to auscultation bilaterally, respirations unlabored  Chest Wall:    No tenderness or deformity   Heart:    Regular rate and rhythm, S1 and S2 normal, no murmur, rub   or gallop  Breast Exam:    No tenderness, masses, or nipple abnormality  Abdomen:     Soft, non-tender, bowel sounds active all four quadrants,    no masses, no organomegaly  Genitalia:    Normal female without lesion, discharge or tenderness  Rectal:    Normal tone, normal prostate, no masses or tenderness;   guaiac negative stool  Extremities:   Extremities normal, atraumatic, no cyanosis or edema  Pulses:   2+ and symmetric all extremities  Skin:   Skin color, texture, turgor normal, no rashes or lesions  Lymph nodes:   Cervical, supraclavicular, and axillary nodes normal  Neurologic:   CNII-XII intact, normal strength, sensation and reflexes    throughout            Labs on Admission:  Basic Metabolic Panel:  Recent Labs Lab 02/28/14 1311  NA 132*  K 4.1  CL 95*  CO2 23  GLUCOSE 105*  BUN 6  CREATININE 0.74  CALCIUM 9.1   Liver Function Tests:  Recent Labs Lab 02/28/14 1311  AST 34  ALT 15  ALKPHOS 65  BILITOT 0.5  PROT 6.3  ALBUMIN 3.3*   No results found for this basename: LIPASE, AMYLASE,  in the last 168 hours No results found for this basename: AMMONIA,  in the last 168 hours CBC:  Recent Labs Lab 02/28/14 1311  WBC 4.9  NEUTROABS 2.3  HGB 13.1  HCT 37.1  MCV 92.3  PLT 143*   Cardiac Enzymes:  Recent Labs Lab 02/28/14 1323  TROPONINI <0.30    BNP (last 3 results) No results found for this basename: PROBNP,  in the last 8760 hours CBG:  Recent Labs Lab 02/28/14 1314  GLUCAP 120*    Radiological Exams on Admission: Ct Head Wo Contrast  02/28/2014   CLINICAL DATA:  Near syncope, head injury after fall.  EXAM: CT HEAD WITHOUT CONTRAST  CT CERVICAL SPINE WITHOUT CONTRAST  TECHNIQUE: Multidetector CT imaging of the head and  cervical spine was performed following the standard protocol without intravenous contrast. Multiplanar CT image reconstructions of the cervical spine were also generated.  COMPARISON:  None.  FINDINGS: CT HEAD FINDINGS  There appears to be small nondisplaced fracture involving the right orbital roof and posterior wall the right frontal sinus with associated hemorrhage. Right frontal scalp laceration is noted. Mild diffuse cortical atrophy is noted. No mass effect or midline shift is noted. Ventricular size is  within normal limits. There is no evidence of mass lesion, intracranial hemorrhage or acute infarction.  CT CERVICAL SPINE FINDINGS  No fracture or significant spondylolisthesis is noted. No significant degenerative disc disease is noted. Minimal degenerative changes are seen involving the posterior facet joints.  IMPRESSION: Right frontal scalp laceration is noted. There appears to be small nondisplaced fracture involving the anterior portion of the right orbital roof and posterior wall of the right frontal sinus with a small amount of hemorrhage present within the right frontal sinus. No acute intracranial abnormality is noted.  No acute abnormality seen in the cervical spine.   Electronically Signed   By: Sabino Dick M.D.   On: 02/28/2014 14:49   Ct Cervical Spine Wo Contrast  02/28/2014   CLINICAL DATA:  Near syncope, head injury after fall.  EXAM: CT HEAD WITHOUT CONTRAST  CT CERVICAL SPINE WITHOUT CONTRAST  TECHNIQUE: Multidetector CT imaging of the head and cervical spine was performed following the standard protocol without intravenous contrast. Multiplanar CT image reconstructions of the cervical spine were also generated.  COMPARISON:  None.  FINDINGS: CT HEAD FINDINGS  There appears to be small nondisplaced fracture involving the right orbital roof and posterior wall the right frontal sinus with associated hemorrhage. Right frontal scalp laceration is noted. Mild diffuse cortical atrophy is  noted. No mass effect or midline shift is noted. Ventricular size is within normal limits. There is no evidence of mass lesion, intracranial hemorrhage or acute infarction.  CT CERVICAL SPINE FINDINGS  No fracture or significant spondylolisthesis is noted. No significant degenerative disc disease is noted. Minimal degenerative changes are seen involving the posterior facet joints.  IMPRESSION: Right frontal scalp laceration is noted. There appears to be small nondisplaced fracture involving the anterior portion of the right orbital roof and posterior wall of the right frontal sinus with a small amount of hemorrhage present within the right frontal sinus. No acute intracranial abnormality is noted.  No acute abnormality seen in the cervical spine.   Electronically Signed   By: Sabino Dick M.D.   On: 02/28/2014 14:49    Assessment/Plan Principal Problem:   Syncope, recurrent with CHI and orbital fx. IVF. Monitor with only pacs. Check orthostatics. Hold beta blocker. No need to repeat recent Mri brain and echo. Check carotid doppler. Pt eval. Ivf. Monitor orthostatics. Ted hose Active Problems:   MVP (mitral valve prolapse)   Closed head injury   Open orbital fracture: f/u Teoh as outpt. Ice.   Non Hodgkin's lymphoma   Vertigo: pt eval   Gait disturbance  Code Status: full Family Communication: none available Disposition Plan: obs, tele  Time spent: 14 min  [facility] Pager [phone]

## 2014-02-28 NOTE — ED Notes (Signed)
Report given to nurse on 3W

## 2014-03-01 DIAGNOSIS — S06300A Unspecified focal traumatic brain injury without loss of consciousness, initial encounter: Secondary | ICD-10-CM | POA: Diagnosis not present

## 2014-03-01 DIAGNOSIS — I059 Rheumatic mitral valve disease, unspecified: Secondary | ICD-10-CM

## 2014-03-01 DIAGNOSIS — I340 Nonrheumatic mitral (valve) insufficiency: Secondary | ICD-10-CM | POA: Diagnosis present

## 2014-03-01 DIAGNOSIS — E785 Hyperlipidemia, unspecified: Secondary | ICD-10-CM

## 2014-03-01 DIAGNOSIS — Z8673 Personal history of transient ischemic attack (TIA), and cerebral infarction without residual deficits: Secondary | ICD-10-CM | POA: Diagnosis not present

## 2014-03-01 DIAGNOSIS — I498 Other specified cardiac arrhythmias: Secondary | ICD-10-CM | POA: Diagnosis not present

## 2014-03-01 DIAGNOSIS — S0219XA Other fracture of base of skull, initial encounter for closed fracture: Secondary | ICD-10-CM | POA: Diagnosis present

## 2014-03-01 DIAGNOSIS — R42 Dizziness and giddiness: Secondary | ICD-10-CM

## 2014-03-01 DIAGNOSIS — I951 Orthostatic hypotension: Secondary | ICD-10-CM | POA: Diagnosis not present

## 2014-03-01 DIAGNOSIS — S06340A Traumatic hemorrhage of right cerebrum without loss of consciousness, initial encounter: Secondary | ICD-10-CM | POA: Diagnosis present

## 2014-03-01 DIAGNOSIS — S02109A Fracture of base of skull, unspecified side, initial encounter for closed fracture: Secondary | ICD-10-CM | POA: Diagnosis not present

## 2014-03-01 DIAGNOSIS — R269 Unspecified abnormalities of gait and mobility: Secondary | ICD-10-CM | POA: Diagnosis present

## 2014-03-01 DIAGNOSIS — S0181XA Laceration without foreign body of other part of head, initial encounter: Secondary | ICD-10-CM | POA: Diagnosis present

## 2014-03-01 DIAGNOSIS — S0100XA Unspecified open wound of scalp, initial encounter: Secondary | ICD-10-CM | POA: Diagnosis not present

## 2014-03-01 DIAGNOSIS — M810 Age-related osteoporosis without current pathological fracture: Secondary | ICD-10-CM | POA: Diagnosis present

## 2014-03-01 DIAGNOSIS — I10 Essential (primary) hypertension: Secondary | ICD-10-CM | POA: Diagnosis not present

## 2014-03-01 DIAGNOSIS — W19XXXA Unspecified fall, initial encounter: Secondary | ICD-10-CM | POA: Diagnosis present

## 2014-03-01 DIAGNOSIS — C8590 Non-Hodgkin lymphoma, unspecified, unspecified site: Secondary | ICD-10-CM | POA: Diagnosis present

## 2014-03-01 DIAGNOSIS — I471 Supraventricular tachycardia: Secondary | ICD-10-CM | POA: Diagnosis present

## 2014-03-01 DIAGNOSIS — I341 Nonrheumatic mitral (valve) prolapse: Secondary | ICD-10-CM | POA: Diagnosis not present

## 2014-03-01 DIAGNOSIS — Z681 Body mass index (BMI) 19 or less, adult: Secondary | ICD-10-CM | POA: Diagnosis not present

## 2014-03-01 DIAGNOSIS — R55 Syncope and collapse: Secondary | ICD-10-CM

## 2014-03-01 DIAGNOSIS — R002 Palpitations: Secondary | ICD-10-CM

## 2014-03-01 DIAGNOSIS — C8589 Other specified types of non-Hodgkin lymphoma, extranodal and solid organ sites: Secondary | ICD-10-CM | POA: Diagnosis not present

## 2014-03-01 LAB — TSH: TSH: 1.41 u[IU]/mL (ref 0.350–4.500)

## 2014-03-01 MED ORDER — MIDODRINE HCL 5 MG PO TABS
5.0000 mg | ORAL_TABLET | Freq: Three times a day (TID) | ORAL | Status: DC
  Administered 2014-03-01 – 2014-03-04 (×8): 5 mg via ORAL
  Filled 2014-03-01 (×11): qty 1

## 2014-03-01 MED ORDER — STROKE: EARLY STAGES OF RECOVERY BOOK
Freq: Once | Status: AC
  Administered 2014-03-01: 08:00:00
  Filled 2014-03-01: qty 1

## 2014-03-01 NOTE — Progress Notes (Signed)
[facility] PROGRESS NOTE  [name] DVV:[id number] DOB: [birth year] DOA: 02/28/2014 PCP: Drema Pry, DO  Assessment/Plan: 1-Syncope, recurrent with CHI and orbital fx.  -Continue with IVF. Orthostatics positive. Hold beta blocker.  -No need to repeat recent Mri brain and echo. Check carotid doppler. -Ted hose  -Cardiology consulted, review Holter monitor, and consideration for start fludrocortisone.  -will check Cortisol level.   Low TSH; recent labs with low TSH. Will repeat, will also check Free T 3 and T 4.   MVP (mitral valve prolapse)  Closed head injury  Open orbital fracture: f/u Teoh as outpt. Ice.  Non Hodgkin's lymphoma  Vertigo: pt eval. Continue with meclizine.  Gait disturbance   Code Status: Full Code.  Family Communication: Care discussed with patient.  Disposition Plan: to be determine.    Consultants:  Cardiology  Procedures: Carotid doppler.; : 1-39% ICA stenosis. Vertebral artery flow is antegrade    Antibiotics:  none  HPI/Subjective: Still with mild vertigo. No dyspnea.    Objective: Filed Vitals:   03/01/14 0702  BP: 93/57  Pulse: 111  Temp:   Resp:     Intake/Output Summary (Last 24 hours) at 03/01/14 6269 Last data filed at 02/28/14 1939  Gross per 24 hour  Intake      0 ml  Output    901 ml  Net   -901 ml   There were no vitals filed for this visit.  Exam:   General: Alert in no distress, thin appearing. Right peri orbital hematoma.   Cardiovascular: S 1, S 2 RRR  Respiratory: CTA  Abdomen: BS present, soft   Musculoskeletal: no edema.   Data Reviewed: Basic Metabolic Panel:  Recent Labs Lab 02/28/14 1311  NA 132*  K 4.1  CL 95*  CO2 23  GLUCOSE 105*  BUN 6  CREATININE 0.74  CALCIUM 9.1   Liver Function Tests:  Recent Labs Lab 02/28/14 1311  AST 34  ALT 15  ALKPHOS 65  BILITOT 0.5  PROT 6.3  ALBUMIN 3.3*   No results found for this basename: LIPASE, AMYLASE,  in the last 168  hours No results found for this basename: AMMONIA,  in the last 168 hours CBC:  Recent Labs Lab 02/28/14 1311  WBC 4.9  NEUTROABS 2.3  HGB 13.1  HCT 37.1  MCV 92.3  PLT 143*   Cardiac Enzymes:  Recent Labs Lab 02/28/14 1323  TROPONINI <0.30   BNP (last 3 results) No results found for this basename: PROBNP,  in the last 8760 hours CBG:  Recent Labs Lab 02/28/14 1314  GLUCAP 120*    No results found for this or any previous visit (from the past 240 hour(s)).   Studies: Ct Head Wo Contrast  02/28/2014   CLINICAL DATA:  Near syncope, head injury after fall.  EXAM: CT HEAD WITHOUT CONTRAST  CT CERVICAL SPINE WITHOUT CONTRAST  TECHNIQUE: Multidetector CT imaging of the head and cervical spine was performed following the standard protocol without intravenous contrast. Multiplanar CT image reconstructions of the cervical spine were also generated.  COMPARISON:  None.  FINDINGS: CT HEAD FINDINGS  There appears to be small nondisplaced fracture involving the right orbital roof and posterior wall the right frontal sinus with associated hemorrhage. Right frontal scalp laceration is noted. Mild diffuse cortical atrophy is noted. No mass effect or midline shift is noted. Ventricular size is within normal limits. There is no evidence of mass lesion, intracranial hemorrhage or acute infarction.  CT CERVICAL SPINE FINDINGS  No fracture or significant spondylolisthesis is noted. No significant degenerative disc disease is noted. Minimal degenerative changes are seen involving the posterior facet joints.  IMPRESSION: Right frontal scalp laceration is noted. There appears to be small nondisplaced fracture involving the anterior portion of the right orbital roof and posterior wall of the right frontal sinus with a small amount of hemorrhage present within the right frontal sinus. No acute intracranial abnormality is noted.  No acute abnormality seen in the cervical spine.   Electronically Signed   By:  Sabino Dick M.D.   On: 02/28/2014 14:49   Ct Cervical Spine Wo Contrast  02/28/2014   CLINICAL DATA:  Near syncope, head injury after fall.  EXAM: CT HEAD WITHOUT CONTRAST  CT CERVICAL SPINE WITHOUT CONTRAST  TECHNIQUE: Multidetector CT imaging of the head and cervical spine was performed following the standard protocol without intravenous contrast. Multiplanar CT image reconstructions of the cervical spine were also generated.  COMPARISON:  None.  FINDINGS: CT HEAD FINDINGS  There appears to be small nondisplaced fracture involving the right orbital roof and posterior wall the right frontal sinus with associated hemorrhage. Right frontal scalp laceration is noted. Mild diffuse cortical atrophy is noted. No mass effect or midline shift is noted. Ventricular size is within normal limits. There is no evidence of mass lesion, intracranial hemorrhage or acute infarction.  CT CERVICAL SPINE FINDINGS  No fracture or significant spondylolisthesis is noted. No significant degenerative disc disease is noted. Minimal degenerative changes are seen involving the posterior facet joints.  IMPRESSION: Right frontal scalp laceration is noted. There appears to be small nondisplaced fracture involving the anterior portion of the right orbital roof and posterior wall of the right frontal sinus with a small amount of hemorrhage present within the right frontal sinus. No acute intracranial abnormality is noted.  No acute abnormality seen in the cervical spine.   Electronically Signed   By: Sabino Dick M.D.   On: 02/28/2014 14:49    Scheduled Meds: . atorvastatin  10 mg Oral Daily  . escitalopram  5 mg Oral Daily  . Influenza vac split quadrivalent PF  0.5 mL Intramuscular Tomorrow-1000  . sodium chloride  3 mL Intravenous Q12H   Continuous Infusions: . sodium chloride      Principal Problem:   Syncope Active Problems:   MVP (mitral valve prolapse)   Closed head injury   Open orbital fracture   Non Hodgkin's  lymphoma   Vertigo   Gait disturbance    Time spent: 35 minutes.     [name] A  [facility] Pager [phone]. If 7PM-7AM, please contact night-coverage at [email or web address], password [facility] 03/01/2014, 9:52 AM  LOS: 1 day

## 2014-03-01 NOTE — Progress Notes (Signed)
VASCULAR LAB PRELIMINARY  PRELIMINARY  PRELIMINARY  PRELIMINARY  Carotid duplex completed.    Preliminary report:  Bilateral:  1-39% ICA stenosis.  Vertebral artery flow is antegrade.    Elaysha Bevard, RVS 03/01/2014, 10:49 AM

## 2014-03-01 NOTE — Progress Notes (Signed)
UR completed 

## 2014-03-01 NOTE — Consult Note (Signed)
CARDIOLOGY CONSULT NOTE   Patient ID: [name] MRN: [id number], DOB/AGE: [birth year]   Admit date: 02/28/2014 Date of Consult: 03/01/2014  Primary Physician: Drema Pry, DO Primary Cardiologist: Dr Fransico Him  Reason for consult:  Recurrent Syncope  Problem List  Past Medical History  Diagnosis Date  . HTN (hypertension)   . Osteoporosis   . TIA (transient ischemic attack)   . Mitral valve prolapse     a. Echo 01/2014: mild MVP.  Marland Kitchen Mitral regurgitation     a. Echo 01/2014: mod MR.  . Ascending aorta dilation     a. Echo 01/2014: mildly dilated.  . Abnormal TSH     a. Labs 01/2014:  low TSH 0.26 with free T4 1.07.  . Non-Hodgkin lymphoma     a. Tx with high dose chemotherapy at [facility] years ago. Pt reported  associated heart valve damage from chemotherapy.    Past Surgical History  Procedure Laterality Date  . Cataract extraction    . Oral tooth       Allergies  Allergies  Allergen Reactions  . Biaxin [Clarithromycin] Other (See Comments)    Severe stomach burning  . Zocor [Simvastatin] Rash    HPI  80 y.o. Female, patient of Dr Radford Pax admitted with recurrent syncope. She also has h/o non-Hodgkins lymphoma, MVP, vertigo presents after syncope and head injury. Felt light headed while standing in line at grocery store. Felt lightheaded prior to fall. "Like blood pressure was low". Seen by Dr. Radford Pax this month for recurrent syncope. Has event monitor. Just PACs per monitor tech. [place] forward and struck her head. Had a right brow laceration that has been repaired by ED staff. Maxillofacial CT shows small nondisplaced fracture involving anterior portion of right orbital roof and posterior frontal sinus with small amount of hemorrhage within right frontal sinus. EDP spoke with ENT on call, Dr. Benjamine Mola, who recommended outpatient follow up in his office. Felt "vertigo" yesterday. Had recent echo which showed moderate TR. H/o orthostasis and ace inhibitor stopped recently.  Tried to ambulate in ED, very unsteady gait and has nausea and dizziness.  On 02/03/2014 event monitor interrogation did not demonstrate any arrhythmias.  She reports cardiac catheterization performed 4 years ago. No report of coronary artery disease. No official records to review.  Patient has history of hypertension. She is currently taking ramipril 20 mg once daily and metoprolol XL 12.5 mg once daily. She has been struggling with issues of poor appetite and weight loss recently. She has noticed frequent episodes of low blood pressure readings in the morning. She also has noticed problems with palpitations that usually occur in the am. She says that she will get palpitations and then feels lightheaded and then feels like she is going to pass out. She denies any chest pain and does not think she has SOB. She occasionally has some LE edema. Her HR would get up in to the 130's but resolves with rest. She is usually active. She walks daily and also practices yoga.   The patient states that her syncopal episodes always happened after prolonged standing (in line etc).  Inpatient Medications  . atorvastatin  10 mg Oral Daily  . escitalopram  5 mg Oral Daily  . sodium chloride  3 mL Intravenous Q12H    Family History Family History  Problem Relation Age of Onset  . Coronary artery disease Father 22  . Brain cancer Sister      Social History History   Social History  .  Marital Status: Widowed    Spouse Name: N/A    Number of Children: 42  . Years of Education: college   Occupational History  .      Retired   Social History Main Topics  . Smoking status: Never Smoker   . Smokeless tobacco: Never Used  . Alcohol Use: No  . Drug Use: No  . Sexual Activity: Not on file   Other Topics Concern  . Not on file   Social History Narrative   Patient lives with daughter when she is in [place]   She is widowed. Husband passed away 70 yrs ago.   Retired   Veterinary surgeon.   Right  handed.   Caffeine - None     Review of Systems  General:  No chills, fever, night sweats or weight changes.  Cardiovascular:  No chest pain, dyspnea on exertion, edema, orthopnea, palpitations, paroxysmal nocturnal dyspnea. Dermatological: No rash, lesions/masses Respiratory: No cough, dyspnea Urologic: No hematuria, dysuria Abdominal:   No nausea, vomiting, diarrhea, bright red blood per rectum, melena, or hematemesis Neurologic:  No visual changes, wkns, changes in mental status. All other systems reviewed and are otherwise negative except as noted above.  Physical Exam  Blood pressure 93/57, pulse 111, temperature 97.5 F (36.4 C), temperature source Oral, resp. rate 16, SpO2 98.00%.  General: Pleasant, NAD, forehead laceration with edema, stitches Psych: Normal affect. Neuro: Alert and oriented X 3. Moves all extremities spontaneously. HEENT: Normal  Neck: Supple without bruits or JVD. Lungs:  Resp regular and unlabored, CTA. Heart: RRR no s3, s4, or murmurs. Abdomen: Soft, non-tender, non-distended, BS + x 4.  Extremities: No clubbing, cyanosis or edema. DP/PT/Radials 2+ and equal bilaterally.  Labs  Recent Labs  02/28/14 1323  TROPONINI <0.30   Lab Results  Component Value Date   WBC 4.9 02/28/2014   HGB 13.1 02/28/2014   HCT 37.1 02/28/2014   MCV 92.3 02/28/2014   PLT 143* 02/28/2014    Recent Labs Lab 02/28/14 1311  NA 132*  K 4.1  CL 95*  CO2 23  BUN 6  CREATININE 0.74  CALCIUM 9.1  PROT 6.3  BILITOT 0.5  ALKPHOS 65  ALT 15  AST 34  GLUCOSE 105*   Radiology/Studies  Ct Head Wo Contrast  02/28/2014   CLINICAL DATA:  Near syncope, head injury after fall. IMPRESSION: Right frontal scalp laceration is noted. There appears to be small nondisplaced fracture involving the anterior portion of the right orbital roof and posterior wall of the right frontal sinus with a small amount of hemorrhage present within the right frontal sinus. No acute intracranial  abnormality is noted.  No acute abnormality seen in the cervical spine.     Echocardiogram - 02/02/2014 - Left ventricle: The cavity size was normal. Systolic function was normal. The estimated ejection fraction was 55%. Wall motion was normal; there were no regional wall motion abnormalities. There was an increased relative contribution of atrial contraction to ventricular filling. Doppler parameters are consistent with abnormal left ventricular relaxation (grade 1 diastolic dysfunction). - Aorta: Aortic root dimension at sinus of Valsalva: 38 mm (ED). - Ascending aorta: The ascending aorta was mildly dilated. - Mitral valve: Mild, late systolic prolapse, involving the anterior leaflet and the posterior leaflet. There was moderate regurgitation. - Tricuspid valve: There was trivial regurgitation. - Pulmonic valve: There was trivial regurgitation. - Impressions: Compared to the study of 2009, the MR appears to have increased from mild to moderate to now moderate  in severity.  Impressions: - Compared to the study of 2009, the MR appears to have increased from mild to moderate to now moderate in severity.  ECG: SB to SR, PACs, anteroseptal infarct, age undetermined, STD in inferolateral leads, unchanged from 02/03/2014    ASSESSMENT AND PLAN  80 year old female  1. Recurrent syncope - appears vasovagal based on clinical scenarios described, event monitor recording shows just frequent intermittent PACs with baseline HR 90 BPM. On the telemetry in the hospital there is very frequent atrial ectopy, no sustained arrhythmias. She was on Metoprolol with symptoms improvement, I would discontinue and start midodrine 5 mg po TID.  2. HTN - controlled, rather hypotensive, avoid vasodilators in the future  3. Bileaflet MVP by echo 2009, moderate MR  Signed, Dorothy Spark, MD, [facility] 03/01/2014, 10:36 AM

## 2014-03-01 NOTE — Evaluation (Signed)
Physical Therapy Evaluation Patient Details Name: [name] MRN: [id number] DOB: Apr 21, 1934 Today's Date: 03/01/2014   History of Present Illness    With h/o recurrent syncope, non-Hodgkins lymphoma, MVP, vertigo presents after syncope and head injury. Felt light headed while standing in line at grocery store. Felt lightheaded prior to fall. "Like blood pressure was low". Seen by Dr. Radford Pax this month for recurrent syncope. Has event monitor. Just PACs per monitor tech. Golden Circle forward and struck her head. Had a right brow laceration that has been repaired by ED staff. Maxillofacial CT shows small nondisplaced fracture involving anterior portion of right orbital roof and posterior frontal sinus with small amount of hemorrhage within right frontal sinus. EDP spoke with ENT on call, Dr. Benjamine Mola, who recommended outpatient follow up in his office. Felt "vertigo" yesterday. Had recent echo which showed moderate TR. H/o orthostasis and ace inhibitor stopped recently. Tried to ambulate in ED, very unsteady gait and has nausea and dizziness.      Clinical Impression  Pt admitted with above. Pt currently with functional limitations due to the deficits listed below (see PT Problem List). Pt with hx of vertigo.  Limited eval as pt nauseated and dizzy.  Pt self limiting due to nausea.  She was receptive to exercises given.  Pt will benefit from skilled PT to increase their independence and safety with mobility to allow discharge to the venue listed below.     Follow Up Recommendations Outpatient PT;Supervision/Assistance - 24 hour (for vestibular rehab)    Equipment Recommendations  Other (comment) (TBA)    Recommendations for Other Services       Precautions / Restrictions Precautions Precautions: Fall Restrictions Weight Bearing Restrictions: No      Mobility  Bed Mobility Overal bed mobility: Independent             General bed mobility comments: Tested pt for BPPV all canals with  negative testing for horizontal canal.  Attempted to test for anterior/posterior however pt would not complete test as when PT took her back to hallpike position, pt refused and said she could not tolerate it and would just stay dizzy.  Tested pt via head thrust with possible positive test for left hypofunction however difficult to determine as pt kept closing eyes.  Pt definitely has vertigo just difficult to determine etiology as pt cannot tolerate testing.  Pt began gaze stability exercises (x1 exercises).  Pt also taught compensatory strategies to use "A" target to focus on as well as to move eyes, then head, then body when moving around.  Pt agrees to practice exercise several times a day.     Transfers                 General transfer comment: Refused to get OOB due to nausea after assessment.    Ambulation/Gait                Stairs            Wheelchair Mobility    Modified Rankin (Stroke Patients Only) Modified Rankin (Stroke Patients Only) Pre-Morbid Rankin Score: No significant disability Modified Rankin: Moderately severe disability     Balance                                             Pertinent Vitals/Pain Pain Assessment: No/denies pain    Home Living Family/patient expects  to be discharged to:: Private residence Living Arrangements: Children Available Help at Discharge: Available 24 hours/day;Family Type of Home: House                Prior Function Level of Independence: Independent               Hand Dominance        Extremity/Trunk Assessment   Upper Extremity Assessment: Defer to OT evaluation           Lower Extremity Assessment: Generalized weakness         Communication   Communication: No difficulties  Cognition Arousal/Alertness: Awake/alert Behavior During Therapy: WFL for tasks assessed/performed Overall Cognitive Status: Within Functional Limits for tasks assessed                       General Comments  No pain, VSS    Exercises Other Exercises Other Exercises: x1 exercises head shakes side to side x 3 reps      Assessment/Plan    PT Assessment Patient needs continued PT services  PT Diagnosis Generalized weakness;Other (comment) (dizziness)   PT Problem List Decreased activity tolerance;Decreased balance;Decreased mobility;Decreased safety awareness;Decreased knowledge of use of DME;Decreased knowledge of precautions  PT Treatment Interventions DME instruction;Gait training;Functional mobility training;Therapeutic activities;Therapeutic exercise;Balance training;Patient/family education;Other (comment) (gaze stabilization exercises)   PT Goals (Current goals can be found in the Care Plan section) Acute Rehab PT Goals Patient Stated Goal: to get better PT Goal Formulation: With patient Time For Goal Achievement: 03/15/14 Potential to Achieve Goals: Good    Frequency Min 3X/week   Barriers to discharge        Co-evaluation               End of Session   Activity Tolerance: Patient limited by fatigue Patient left: in bed;with call bell/phone within reach Nurse Communication: Mobility status    Functional Assessment Tool Used: clinical judgement Functional Limitation: Changing and maintaining body position Changing and Maintaining Body Position Current Status (F3545): At least 1 percent but less than 20 percent impaired, limited or restricted Changing and Maintaining Body Position Goal Status (G2563): 0 percent impaired, limited or restricted    Time: 0931-0949 PT Time Calculation (min): 18 min   Charges:   PT Evaluation $Initial PT Evaluation Tier I: 1 Procedure PT Treatments $Therapeutic Exercise: 8-22 mins   PT G Codes:   Functional Assessment Tool Used: clinical judgement Functional Limitation: Changing and maintaining body position    INGOLD,[name] 03/01/2014, 10:10 AM [facility] [phone] (pager)

## 2014-03-02 ENCOUNTER — Ambulatory Visit: Payer: BC Managed Care – PPO | Admitting: Internal Medicine

## 2014-03-02 DIAGNOSIS — R55 Syncope and collapse: Secondary | ICD-10-CM

## 2014-03-02 DIAGNOSIS — I951 Orthostatic hypotension: Secondary | ICD-10-CM | POA: Diagnosis present

## 2014-03-02 DIAGNOSIS — I10 Essential (primary) hypertension: Secondary | ICD-10-CM

## 2014-03-02 LAB — T3, FREE: T3, Free: 2 pg/mL — ABNORMAL LOW (ref 2.3–4.2)

## 2014-03-02 LAB — T4, FREE: Free T4: 1.04 ng/dL (ref 0.80–1.80)

## 2014-03-02 LAB — CORTISOL-AM, BLOOD: CORTISOL - AM: 7.3 ug/dL (ref 4.3–22.4)

## 2014-03-02 MED ORDER — FLUDROCORTISONE ACETATE 0.1 MG PO TABS
0.1000 mg | ORAL_TABLET | Freq: Every day | ORAL | Status: DC
  Administered 2014-03-02 – 2014-03-04 (×3): 0.1 mg via ORAL
  Filled 2014-03-02 (×3): qty 1

## 2014-03-02 NOTE — Progress Notes (Signed)
SUBJECTIVE:   No complaints  OBJECTIVE:   Vitals:   Filed Vitals:   03/01/14 1228 03/01/14 1514 03/02/14 0630 03/02/14 0751  BP:  105/59  116/47  Pulse:  66  60  Temp:  98.7 F (37.1 C) 97.9 F (36.6 C)   TempSrc:  Oral Oral   Resp:  16 16   Height: 5\' 6"  (1.676 m)     Weight: 102 lb 11.2 oz (46.584 kg)  102 lb 14.4 oz (46.675 kg)   SpO2:  96% 95%    I&O's:   Intake/Output Summary (Last 24 hours) at 03/02/14 0945 Last data filed at 03/02/14 0786  Gross per 24 hour  Intake    340 ml  Output   2101 ml  Net  -1761 ml   TELEMETRY: Reviewed telemetry pt in NSR with frequent PAC's     PHYSICAL EXAM General: Well developed, well nourished, in no acute distress  Lungs:   Clear bilaterally to auscultation and percussion. Heart:   HRRR S1 S2 Pulses are 2+ & equal. Abdomen: Bowel sounds are positive, abdomen soft and non-tender without masses Extremities:   No clubbing, cyanosis or edema.  DP +1 Neuro: Alert and oriented X 3. Psych:  Good affect, responds appropriately   LABS: Basic Metabolic Panel:  Recent Labs  02/28/14 1311  NA 132*  K 4.1  CL 95*  CO2 23  GLUCOSE 105*  BUN 6  CREATININE 0.74  CALCIUM 9.1   Liver Function Tests:  Recent Labs  02/28/14 1311  AST 34  ALT 15  ALKPHOS 65  BILITOT 0.5  PROT 6.3  ALBUMIN 3.3*   No results found for this basename: LIPASE, AMYLASE,  in the last 72 hours CBC:  Recent Labs  02/28/14 1311  WBC 4.9  NEUTROABS 2.3  HGB 13.1  HCT 37.1  MCV 92.3  PLT 143*   Cardiac Enzymes:  Recent Labs  02/28/14 1323  TROPONINI <0.30   BNP: No components found with this basename: POCBNP,  D-Dimer: No results found for this basename: DDIMER,  in the last 72 hours Hemoglobin A1C: No results found for this basename: HGBA1C,  in the last 72 hours Fasting Lipid Panel: No results found for this basename: CHOL, HDL, LDLCALC, TRIG, CHOLHDL, LDLDIRECT,  in the last 72 hours Thyroid Function Tests:  Recent Labs  03/01/14 1120  TSH 1.410  T3FREE 2.0*   Anemia Panel: No results found for this basename: VITAMINB12, FOLATE, FERRITIN, TIBC, IRON, RETICCTPCT,  in the last 72 hours Coag Panel:   Lab Results  Component Value Date   INR 1.0 04/25/2008    RADIOLOGY: Ct Head Wo Contrast  02/28/2014   CLINICAL DATA:  Near syncope, head injury after fall.  EXAM: CT HEAD WITHOUT CONTRAST  CT CERVICAL SPINE WITHOUT CONTRAST  TECHNIQUE: Multidetector CT imaging of the head and cervical spine was performed following the standard protocol without intravenous contrast. Multiplanar CT image reconstructions of the cervical spine were also generated.  COMPARISON:  None.  FINDINGS: CT HEAD FINDINGS  There appears to be small nondisplaced fracture involving the right orbital roof and posterior wall the right frontal sinus with associated hemorrhage. Right frontal scalp laceration is noted. Mild diffuse cortical atrophy is noted. No mass effect or midline shift is noted. Ventricular size is within normal limits. There is no evidence of mass lesion, intracranial hemorrhage or acute infarction.  CT CERVICAL SPINE FINDINGS  No fracture or significant spondylolisthesis is noted. No significant degenerative disc disease is  noted. Minimal degenerative changes are seen involving the posterior facet joints.  IMPRESSION: Right frontal scalp laceration is noted. There appears to be small nondisplaced fracture involving the anterior portion of the right orbital roof and posterior wall of the right frontal sinus with a small amount of hemorrhage present within the right frontal sinus. No acute intracranial abnormality is noted.  No acute abnormality seen in the cervical spine.   Electronically Signed   By: Sabino Dick M.D.   On: 02/28/2014 14:49   Ct Cervical Spine Wo Contrast  02/28/2014   CLINICAL DATA:  Near syncope, head injury after fall.  EXAM: CT HEAD WITHOUT CONTRAST  CT CERVICAL SPINE WITHOUT CONTRAST  TECHNIQUE: Multidetector CT  imaging of the head and cervical spine was performed following the standard protocol without intravenous contrast. Multiplanar CT image reconstructions of the cervical spine were also generated.  COMPARISON:  None.  FINDINGS: CT HEAD FINDINGS  There appears to be small nondisplaced fracture involving the right orbital roof and posterior wall the right frontal sinus with associated hemorrhage. Right frontal scalp laceration is noted. Mild diffuse cortical atrophy is noted. No mass effect or midline shift is noted. Ventricular size is within normal limits. There is no evidence of mass lesion, intracranial hemorrhage or acute infarction.  CT CERVICAL SPINE FINDINGS  No fracture or significant spondylolisthesis is noted. No significant degenerative disc disease is noted. Minimal degenerative changes are seen involving the posterior facet joints.  IMPRESSION: Right frontal scalp laceration is noted. There appears to be small nondisplaced fracture involving the anterior portion of the right orbital roof and posterior wall of the right frontal sinus with a small amount of hemorrhage present within the right frontal sinus. No acute intracranial abnormality is noted.  No acute abnormality seen in the cervical spine.   Electronically Signed   By: Sabino Dick M.D.   On: 02/28/2014 14:49   ASSESSMENT AND PLAN  80 year old female  1. Recurrent syncope - appears orthostatic based on clinical scenarios described (always occur when standing in line shopping for a while), event monitor recording shows just frequent intermittent PACs and telemetry shows PVC's, frequent PAC's and short bursts of nonsustained atrial tachcyardia up to 5 beats some with abberation.  I cannot find any evidence of PAF.  There was one 5 beat run of WCT that is most likely a short run of atrial tachycardia with aberration. Metoprolol stopped due to low BP and started on midodrine 5 mg po TID. AM cortisol ok.  2D echo with normal LVF.  She is still  orthostatic by check this am on Proamatine Would add Florinef 0.1mg  daily.  Also recommend thigh high TED hose stockings and avoid standing for long periods of time.  Given baseline ST changes in inferior leads would get an outpt Lexiscan myoview. 2. HTN - controlled, rather hypotensive, avoid vasodilators in the future  3. Bileaflet MVP by echo 2009, moderate MR    Sueanne Margarita, MD  03/02/2014  9:45 AM

## 2014-03-02 NOTE — Progress Notes (Signed)
[facility] PROGRESS NOTE  [name] UMP:[id number] DOB: 11/22/1933 DOA: 02/28/2014 PCP: Drema Pry, DO  Assessment/Plan: 1-Syncope, recurrent with CHI and orbital fx.  -Continue with IVF. Orthostatics positive. Hold beta blocker.  -No need to repeat recent Mri brain and echo.  carotid doppler:1-39% ICA stenosis. Vertebral artery flow is antegrade -Ted hose  -Cardiology consulted, review Holter monitor.  -Cortisol level at 7.  -Started on midodrine. Start fludrocortisone.    Low TSH; recent labs with low TSH. TSH normal, free T 3 low at 2.0. Need repeat thyroid function in 2 to 4 week. Need referral to endocrinologist.   MVP (mitral valve prolapse)  Closed head injury  Open orbital fracture: f/u [facility] as outpt. Ice.  Non Hodgkin's lymphoma  Vertigo: pt eval. Continue with meclizine.  Gait disturbance   Code Status: Full Code.  Family Communication: Care discussed with patient.  Disposition Plan: to be determine.    Consultants:  Cardiology  Procedures: Carotid doppler.; : 1-39% ICA stenosis. Vertebral artery flow is antegrade    Antibiotics:  none  HPI/Subjective: Still with mild vertigo. No dyspnea.    Objective: Filed Vitals:   03/02/14 1427  BP: 115/68  Pulse: 54  Temp: 97.9 F (36.6 C)  Resp: 19    Intake/Output Summary (Last 24 hours) at 03/02/14 1622 Last data filed at 03/02/14 0833  Gross per 24 hour  Intake    340 ml  Output   1600 ml  Net  -1260 ml   Filed Weights   03/01/14 1228 03/02/14 0630  Weight: 46.584 kg (102 lb 11.2 oz) 46.675 kg (102 lb 14.4 oz)    Exam:   General: Alert in no distress, thin appearing. Right peri orbital hematoma.   Cardiovascular: S 1, S 2 RRR  Respiratory: CTA  Abdomen: BS present, soft   Musculoskeletal: no edema.   Data Reviewed: Basic Metabolic Panel:  Recent Labs Lab 02/28/14 1311  NA 132*  K 4.1  CL 95*  CO2 23  GLUCOSE 105*  BUN 6  CREATININE 0.74  CALCIUM 9.1   Liver  Function Tests:  Recent Labs Lab 02/28/14 1311  AST 34  ALT 15  ALKPHOS 65  BILITOT 0.5  PROT 6.3  ALBUMIN 3.3*   No results found for this basename: LIPASE, AMYLASE,  in the last 168 hours No results found for this basename: AMMONIA,  in the last 168 hours CBC:  Recent Labs Lab 02/28/14 1311  WBC 4.9  NEUTROABS 2.3  HGB 13.1  HCT 37.1  MCV 92.3  PLT 143*   Cardiac Enzymes:  Recent Labs Lab 02/28/14 1323  TROPONINI <0.30   BNP (last 3 results) No results found for this basename: PROBNP,  in the last 8760 hours CBG:  Recent Labs Lab 02/28/14 1314  GLUCAP 120*    No results found for this or any previous visit (from the past 240 hour(s)).   Studies: No results found.  Scheduled Meds: . atorvastatin  10 mg Oral Daily  . escitalopram  5 mg Oral Daily  . fludrocortisone  0.1 mg Oral Daily  . midodrine  5 mg Oral TID WC  . sodium chloride  3 mL Intravenous Q12H   Continuous Infusions: . sodium chloride 100 mL/hr at 03/01/14 4008    Principal Problem:   Syncope Active Problems:   MVP (mitral valve prolapse)   Closed head injury   Open orbital fracture   Non Hodgkin's lymphoma   Vertigo   Gait disturbance   Orthostatic hypotension  Time spent: 35 minutes.     [name] A  [facility] Pager [phone]. If 7PM-7AM, please contact night-coverage at [email or web address], password [facility] 03/02/2014, 4:22 PM  LOS: 2 days

## 2014-03-02 NOTE — Progress Notes (Signed)
Physical Therapy Treatment Patient Details Name: [name] MRN: [id number] DOB: 1933/09/06 Today's Date: 03/02/2014    History of Present Illness Pt admit with syncope and vertigo.    PT Comments    Pt admitted with above. Pt currently with functional limitations due to dizziness and weakness. Pt able to ambulate today with less dizziness.   Pt will benefit from skilled PT to increase their independence and safety with mobility to allow discharge to the venue listed below.   Follow Up Recommendations  Outpatient PT;Supervision/Assistance - 24 hour (for vestibular rehab)     Equipment Recommendations  Other (comment) (TBA)    Recommendations for Other Services       Precautions / Restrictions Precautions Precautions: Fall Restrictions Weight Bearing Restrictions: No    Mobility  Bed Mobility Overal bed mobility: Independent             General bed mobility comments: Pt with questionable positive hallpike on right therefore performed Epley for right BPPV.   Transfers Overall transfer level: Needs assistance Equipment used: None Transfers: Sit to/from Stand Sit to Stand: Supervision            Ambulation/Gait Ambulation/Gait assistance: Min guard Ambulation Distance (Feet): 75 Feet Assistive device: None Gait Pattern/deviations: Step-through pattern;Decreased stride length;Drifts right/left   Gait velocity interpretation: Below normal speed for age/gender General Gait Details: Overall good balance.  Slight unsteady with turns.  Pt usigng compensatory strategies well with ambulation.  Focusing on targets.     Stairs            Wheelchair Mobility    Modified Rankin (Stroke Patients Only) Modified Rankin (Stroke Patients Only) Pre-Morbid Rankin Score: No significant disability Modified Rankin: Moderately severe disability     Balance Overall balance assessment: Needs assistance;History of Falls         Standing balance support: No  upper extremity supported;During functional activity Standing balance-Leahy Scale: Poor Standing balance comment: Requires 1 UE support occasionally for balance.                      Cognition Arousal/Alertness: Awake/alert Behavior During Therapy: WFL for tasks assessed/performed Overall Cognitive Status: Within Functional Limits for tasks assessed                      Exercises Other Exercises Other Exercises: x1 exercises head shakes side to side x 3 reps.  Still with notable gaze stability issues.      General Comments        Pertinent Vitals/Pain Pain Assessment: No/denies pain BP 141/68 with HR 58 bpm.    Home Living                      Prior Function            PT Goals (current goals can now be found in the care plan section) Progress towards PT goals: Progressing toward goals    Frequency  Min 3X/week    PT Plan Current plan remains appropriate    Co-evaluation             End of Session Equipment Utilized During Treatment: Gait belt Activity Tolerance: Patient limited by fatigue Patient left: in chair;with call bell/phone within reach     Time: 1359-1425 PT Time Calculation (min): 26 min  Charges:  $Gait Training: 8-22 mins $Therapeutic Exercise: 8-22 mins  G CodesDenice Paradise 03/02/2014, 2:43 PM [facility] [phone] (pager)

## 2014-03-03 DIAGNOSIS — I341 Nonrheumatic mitral (valve) prolapse: Secondary | ICD-10-CM

## 2014-03-03 NOTE — Progress Notes (Signed)
SUBJECTIVE:  Dizziness much improved  OBJECTIVE:   Vitals:   Filed Vitals:   03/02/14 0751 03/02/14 1427 03/02/14 2059 03/03/14 0532  BP: 116/47 115/68 113/42 128/62  Pulse: 60 54 60 58  Temp:  97.9 F (36.6 C) 97.7 F (36.5 C) 97.8 F (36.6 C)  TempSrc:  Oral Axillary Oral  Resp:  19  15  Height:      Weight:    103 lb 9.6 oz (46.993 kg)  SpO2:  99% 98% 100%   I&O's:   Intake/Output Summary (Last 24 hours) at 03/03/14 0840 Last data filed at 03/03/14 9983  Gross per 24 hour  Intake 3178.33 ml  Output   1800 ml  Net 1378.33 ml   TELEMETRY: Reviewed telemetry pt in NSR:     PHYSICAL EXAM General: Well developed, well nourished, in no acute distress Head: Eyes PERRLA, No xanthomas.   Normal cephalic and atramatic  Lungs:   Clear bilaterally to auscultation and percussion. Heart:   HRRR S1 S2 Pulses are 2+ & equal. Abdomen: Bowel sounds are positive, abdomen soft and non-tender without masses  Extremities:   No clubbing, cyanosis or edema.  DP +1 Neuro: Alert and oriented X 3. Psych:  Good affect, responds appropriately   LABS: Basic Metabolic Panel:  Recent Labs  02/28/14 1311  NA 132*  K 4.1  CL 95*  CO2 23  GLUCOSE 105*  BUN 6  CREATININE 0.74  CALCIUM 9.1   Liver Function Tests:  Recent Labs  02/28/14 1311  AST 34  ALT 15  ALKPHOS 65  BILITOT 0.5  PROT 6.3  ALBUMIN 3.3*   No results found for this basename: LIPASE, AMYLASE,  in the last 72 hours CBC:  Recent Labs  02/28/14 1311  WBC 4.9  NEUTROABS 2.3  HGB 13.1  HCT 37.1  MCV 92.3  PLT 143*   Cardiac Enzymes:  Recent Labs  02/28/14 1323  TROPONINI <0.30   BNP: No components found with this basename: POCBNP,  D-Dimer: No results found for this basename: DDIMER,  in the last 72 hours Hemoglobin A1C: No results found for this basename: HGBA1C,  in the last 72 hours Fasting Lipid Panel: No results found for this basename: CHOL, HDL, LDLCALC, TRIG, CHOLHDL, LDLDIRECT,  in  the last 72 hours Thyroid Function Tests:  Recent Labs  03/01/14 1120  TSH 1.410  T3FREE 2.0*   Anemia Panel: No results found for this basename: VITAMINB12, FOLATE, FERRITIN, TIBC, IRON, RETICCTPCT,  in the last 72 hours Coag Panel:   Lab Results  Component Value Date   INR 1.0 04/25/2008    RADIOLOGY: Ct Head Wo Contrast  02/28/2014   CLINICAL DATA:  Near syncope, head injury after fall.  EXAM: CT HEAD WITHOUT CONTRAST  CT CERVICAL SPINE WITHOUT CONTRAST  TECHNIQUE: Multidetector CT imaging of the head and cervical spine was performed following the standard protocol without intravenous contrast. Multiplanar CT image reconstructions of the cervical spine were also generated.  COMPARISON:  None.  FINDINGS: CT HEAD FINDINGS  There appears to be small nondisplaced fracture involving the right orbital roof and posterior wall the right frontal sinus with associated hemorrhage. Right frontal scalp laceration is noted. Mild diffuse cortical atrophy is noted. No mass effect or midline shift is noted. Ventricular size is within normal limits. There is no evidence of mass lesion, intracranial hemorrhage or acute infarction.  CT CERVICAL SPINE FINDINGS  No fracture or significant spondylolisthesis is noted. No significant degenerative disc disease  is noted. Minimal degenerative changes are seen involving the posterior facet joints.  IMPRESSION: Right frontal scalp laceration is noted. There appears to be small nondisplaced fracture involving the anterior portion of the right orbital roof and posterior wall of the right frontal sinus with a small amount of hemorrhage present within the right frontal sinus. No acute intracranial abnormality is noted.  No acute abnormality seen in the cervical spine.   Electronically Signed   By: Sabino Dick M.D.   On: 02/28/2014 14:49   Ct Cervical Spine Wo Contrast  02/28/2014   CLINICAL DATA:  Near syncope, head injury after fall.  EXAM: CT HEAD WITHOUT CONTRAST  CT  CERVICAL SPINE WITHOUT CONTRAST  TECHNIQUE: Multidetector CT imaging of the head and cervical spine was performed following the standard protocol without intravenous contrast. Multiplanar CT image reconstructions of the cervical spine were also generated.  COMPARISON:  None.  FINDINGS: CT HEAD FINDINGS  There appears to be small nondisplaced fracture involving the right orbital roof and posterior wall the right frontal sinus with associated hemorrhage. Right frontal scalp laceration is noted. Mild diffuse cortical atrophy is noted. No mass effect or midline shift is noted. Ventricular size is within normal limits. There is no evidence of mass lesion, intracranial hemorrhage or acute infarction.  CT CERVICAL SPINE FINDINGS  No fracture or significant spondylolisthesis is noted. No significant degenerative disc disease is noted. Minimal degenerative changes are seen involving the posterior facet joints.  IMPRESSION: Right frontal scalp laceration is noted. There appears to be small nondisplaced fracture involving the anterior portion of the right orbital roof and posterior wall of the right frontal sinus with a small amount of hemorrhage present within the right frontal sinus. No acute intracranial abnormality is noted.  No acute abnormality seen in the cervical spine.   Electronically Signed   By: Sabino Dick M.D.   On: 02/28/2014 14:49   ASSESSMENT AND PLAN  80 year old female  1. Recurrent syncope - appears orthostatic based on clinical scenarios described (always occur when standing in line shopping for a while), event monitor recording shows just frequent intermittent PACs and telemetry shows PVC's, frequent PAC's and short bursts of nonsustained atrial tachcyardia up to 5 beats some with abberation. I cannot find any evidence of PAF. There was one 5 beat run of WCT that is most likely a short run of atrial tachycardia with aberration. Metoprolol stopped due to low BP and started on midodrine 5 mg po TID. AM  cortisol ok. 2D echo with normal LVF. She is still orthostatic by check yesterday on Proamatine and Florinef 0.1mg  daily added.  - Recommend thigh high TED hose stockings and avoid standing for long periods of time.  - Given baseline ST changes in inferior leads would get an outpt Lexiscan myoview once she recovers from fall - Continue Proamatine and Florinef  - check orthostatic BPs today 2. HTN - controlled, rather hypotensive, avoid vasodilators in the future  3. Bileaflet MVP by echo 2009, moderate MR   Sueanne Margarita, MD  03/03/2014  8:40 AM

## 2014-03-03 NOTE — Progress Notes (Addendum)
CARE MANAGEMENT NOTE 03/03/2014  Patient:  CHARLANN, WAYNE   Account Number:  [id number]  Date Initiated:  03/03/2014  Documentation initiated by:  [facility]  Subjective/Objective Assessment:   syncope     Action/Plan:   lives at home with dtr   Anticipated DC Date:  03/04/2014   Anticipated DC Plan:  [place]  CM consult      Choice offered to / List presented to:          [facility] arranged  [facility]-2 PT      [place]   Status of service:  Completed, signed off Medicare Important Message given?  YES (If response is "NO", the following Medicare IM given date fields will be blank) Date Medicare IM given:  03/03/2014 Medicare IM given by:  [facility] Date Additional Medicare IM given:   Additional Medicare IM given by:    Discharge Disposition:  [place]  Per UR Regulation:  Reviewed for med. necessity/level of care/duration of stay  If discussed at [place] of Stay Meetings, dates discussed:    Comments:  03/03/2014 1600 PT recommendation is for [facility] PT and pt is agreeable to HHPT. Offered choice for [facility] PT. [place] referral for [facility] PT-vestibular therapy. Cancelled Neurorehab outpt PT.  Jonnie Finner RN CCM Case Mgmt phone [phone]  03/03/2014 1540 NCM spoke to pt and states she lives at home with dtr. She does want to go to outpt PT at [facility]. Provided pt with contact info and address to [facility]. Pt requesting RW for home. Notified [facility] for RW. Referral for Neurorehab entered in EPIC. Jonnie Finner RN CCM Case Mgmt phone [phone]

## 2014-03-03 NOTE — Progress Notes (Signed)
[facility] PROGRESS NOTE  [name] BJY:[id number] DOB: 01/12/1934 DOA: 02/28/2014 PCP: Drema Pry, DO  Assessment/Plan: 1-Syncope, recurrent with CHI and orbital fx.  -Continue with IVF. Orthostatics positive. Hold beta blocker.  -No need to repeat recent Mri brain and echo.  carotid doppler:1-39% ICA stenosis. Vertebral artery flow is antegrade -Ted hose  -Cardiology consulted, review Holter monitor.  -Cortisol level at 7.  -Continue with midodrine, fludrocortisone.   -Improving.   Low TSH; recent labs with low TSH. TSH normal, free T 3 low at 2.0. Need repeat thyroid function in 2 to 4 week. Need referral to endocrinologist.   MVP (mitral valve prolapse)  Closed head injury  Open orbital fracture: f/u [facility] as outpt. Ice.  Non Hodgkin's lymphoma  Vertigo: pt eval. Continue with meclizine.  Gait disturbance   Code Status: Full Code.  Family Communication: Care discussed with patient.  Disposition Plan: discharge 10-03.    Consultants:  Cardiology  Procedures: Carotid doppler.; : 1-39% ICA stenosis. Vertebral artery flow is antegrade    Antibiotics:  none  HPI/Subjective: Feeling better. Would not have help at home today.    Objective: Filed Vitals:   03/03/14 1037  BP:   Pulse:   Temp: 97.8 F (36.6 C)  Resp: 16    Intake/Output Summary (Last 24 hours) at 03/03/14 1427 Last data filed at 03/03/14 0850  Gross per 24 hour  Intake 3478.33 ml  Output   1800 ml  Net 1678.33 ml   Filed Weights   03/01/14 1228 03/02/14 0630 03/03/14 0532  Weight: 46.584 kg (102 lb 11.2 oz) 46.675 kg (102 lb 14.4 oz) 46.993 kg (103 lb 9.6 oz)    Exam:   General: Alert in no distress, thin appearing. Right peri orbital hematoma.   Cardiovascular: S 1, S 2 RRR  Respiratory: CTA  Abdomen: BS present, soft   Musculoskeletal: no edema.   Data Reviewed: Basic Metabolic Panel:  Recent Labs Lab 02/28/14 1311  NA 132*  K 4.1  CL 95*  CO2 23   GLUCOSE 105*  BUN 6  CREATININE 0.74  CALCIUM 9.1   Liver Function Tests:  Recent Labs Lab 02/28/14 1311  AST 34  ALT 15  ALKPHOS 65  BILITOT 0.5  PROT 6.3  ALBUMIN 3.3*   No results found for this basename: LIPASE, AMYLASE,  in the last 168 hours No results found for this basename: AMMONIA,  in the last 168 hours CBC:  Recent Labs Lab 02/28/14 1311  WBC 4.9  NEUTROABS 2.3  HGB 13.1  HCT 37.1  MCV 92.3  PLT 143*   Cardiac Enzymes:  Recent Labs Lab 02/28/14 1323  TROPONINI <0.30   BNP (last 3 results) No results found for this basename: PROBNP,  in the last 8760 hours CBG:  Recent Labs Lab 02/28/14 1314  GLUCAP 120*    No results found for this or any previous visit (from the past 240 hour(s)).   Studies: No results found.  Scheduled Meds: . atorvastatin  10 mg Oral Daily  . escitalopram  5 mg Oral Daily  . fludrocortisone  0.1 mg Oral Daily  . midodrine  5 mg Oral TID WC  . sodium chloride  3 mL Intravenous Q12H   Continuous Infusions: . sodium chloride 50 mL/hr at 03/02/14 1722    Principal Problem:   Syncope Active Problems:   MVP (mitral valve prolapse)   Closed head injury   Open orbital fracture   Non Hodgkin's lymphoma   Vertigo  Gait disturbance   Orthostatic hypotension    Time spent: 25 minutes.     [name] A  [facility] Pager [phone]. If 7PM-7AM, please contact night-coverage at [email or web address], password [email or web address] 03/03/2014, 2:27 PM  LOS: 3 days

## 2014-03-03 NOTE — Progress Notes (Signed)
Medicare Important Message given?  YES (If response is "NO", the following Medicare IM given date fields will be blank) Date Medicare IM given:  03/03/2014 Medicare IM given by:  Jahayra Mazo 

## 2014-03-03 NOTE — Progress Notes (Signed)
Physical Therapy Treatment Patient Details Name: [name] MRN: [id number] DOB: [birth year] Today's Date: 03/03/2014    History of Present Illness Pt admit with syncope and vertigo.    PT Comments    Pt admitted with above. Pt currently with functional limitations due to vertigo and balance deficits.  Progressing well.  Will need RW and HHPT for vestibular rehab with transition to Outpt PT.  Pt will benefit from skilled PT to increase their independence and safety with mobility to allow discharge to the venue listed below.  Follow Up Recommendations  HHPT with transition to Outpatient PT;Supervision/Assistance - 24 hour (for vestibular rehab)     Equipment Recommendations  Other (comment) (TBA)    Recommendations for Other Services       Precautions / Restrictions Precautions Precautions: Fall Restrictions Weight Bearing Restrictions: No    Mobility  Bed Mobility Overal bed mobility: Independent             General bed mobility comments: Pt with resolved BPPV.  Pt still with slight hypofunction therefore instructed her to continue x1 exercises.    Transfers Overall transfer level: Needs assistance Equipment used: None Transfers: Sit to/from Stand Sit to Stand: Independent            Ambulation/Gait Ambulation/Gait assistance: Supervision Ambulation Distance (Feet): 250 Feet Assistive device: Rolling walker (2 wheeled) Gait Pattern/deviations: Step-through pattern;Decreased stride length;Drifts right/left   Gait velocity interpretation: Below normal speed for age/gender General Gait Details: Overall good balance.  Slight unsteady with turns.  Pt usigng compensatory strategies well with ambulation.  Focusing on targets.  Needs RW at home since she will be alone.  Pt receptive.     Stairs            Wheelchair Mobility    Modified Rankin (Stroke Patients Only) Modified Rankin (Stroke Patients Only) Pre-Morbid Rankin Score: No significant  disability Modified Rankin: Moderately severe disability     Balance Overall balance assessment: Needs assistance         Standing balance support: Bilateral upper extremity supported;During functional activity Standing balance-Leahy Scale: Poor Standing balance comment: Requires RW for support.              High level balance activites: Direction changes;Turns;Sudden stops;Head turns High Level Balance Comments: can do all of the above with supervision with RW.      Cognition Arousal/Alertness: Awake/alert Behavior During Therapy: WFL for tasks assessed/performed Overall Cognitive Status: Within Functional Limits for tasks assessed                      Exercises Other Exercises Other Exercises: x1 exercises head shakes side to side x 3 reps.  Still with notable gaze stability issues.      General Comments        Pertinent Vitals/Pain Pain Assessment: No/denies pain VSS    Home Living                      Prior Function            PT Goals (current goals can now be found in the care plan section) Progress towards PT goals: Progressing toward goals    Frequency  Min 3X/week    PT Plan Current plan remains appropriate    Co-evaluation             End of Session Equipment Utilized During Treatment: Gait belt Activity Tolerance: Patient limited by fatigue Patient left: with call bell/phone  within reach;in bed     Time: 0900-0929 PT Time Calculation (min): 29 min  Charges:  $Gait Training: 8-22 mins $Therapeutic Exercise: 8-22 mins                    G Codes:      Irwin Brakeman F 03/20/14, 11:51 AM [facility] [phone] (pager)

## 2014-03-04 DIAGNOSIS — I951 Orthostatic hypotension: Secondary | ICD-10-CM

## 2014-03-04 MED ORDER — FLUDROCORTISONE ACETATE 0.1 MG PO TABS
0.1000 mg | ORAL_TABLET | Freq: Every day | ORAL | Status: DC
Start: 2014-03-04 — End: 2014-03-13

## 2014-03-04 MED ORDER — MIDODRINE HCL 5 MG PO TABS
5.0000 mg | ORAL_TABLET | Freq: Three times a day (TID) | ORAL | Status: DC
Start: 2014-03-04 — End: 2014-03-13

## 2014-03-04 NOTE — Discharge Summary (Signed)
Physician Discharge Summary  Neliah Cuyler VOJ:[id number] DOB: November 21, 1933 DOA: 02/28/2014  PCP: Drema Pry, DO  Admit date: 02/28/2014 Discharge date: 03/04/2014  Time spent: 35 minutes  Recommendations for Outpatient Follow-up:  1. Needs follow up with PCP for orthostatic hypotension.  2. Might need referral to endocrinologist for further evaluation of Thyroid dysfunction and orthostatic hypotension/  3. Follow up with Dr Benjamine Mola for orbital fracture.   Discharge Diagnoses:    Orthostatic hypotension   MVP (mitral valve prolapse)   Syncope   Closed head injury   Open orbital fracture   Non Hodgkin's lymphoma   Vertigo   Gait disturbance   Discharge Condition: stable  Diet recommendation: Heart Healthy  Filed Weights   03/02/14 0630 03/03/14 0532 03/04/14 0543  Weight: 46.675 kg (102 lb 14.4 oz) 46.993 kg (103 lb 9.6 oz) 46.222 kg (101 lb 14.4 oz)    History of present illness:  [name] is a 80 y.o. female  With h/o recurrent syncope, non-Hodgkins lymphoma, MVP, vertigo presents after syncope and head injury. Felt light headed while standing in line at grocery store. Felt lightheaded prior to fall. "Like blood pressure was low". Seen by Dr. Radford Pax this month for recurrent syncope. Has event monitor. Just PACs per monitor tech. [place] forward and struck her head. Had a right brow laceration that has been repaired by ED staff. Maxillofacial CT shows small nondisplaced fracture involving anterior portion of right orbital roof and posterior frontal sinus with small amount of hemorrhage within right frontal sinus. EDP spoke with ENT on call, Dr. Benjamine Mola, who recommended outpatient follow up in his office. Felt "vertigo" yesterday. Had recent echo which showed moderate TR. H/o orthostasis and ace inhibitor stopped recently. Tried to ambulate in ED, very unsteady gait and has nausea and dizziness.   Hospital Course:  1-Syncope, recurrent with CHI and orbital fx.  -Received IV fluids.   Orthostatics positive. Hold beta blocker.  -No need to repeat recent Mri brain and echo. carotid doppler:1-39% ICA stenosis. Vertebral artery flow is antegrade  -Ted hose  -Cardiology consulted, review Holter monitor.  -Cortisol level at 7.  -Continue with midodrine, fludrocortisone.  -Improving.  Low TSH; recent labs with low TSH. TSH normal, free T 3 low at 2.0. Need repeat thyroid function in 2 to 4 week. Need referral to endocrinologist.  MVP (mitral valve prolapse)  Closed head injury  Open orbital fracture: f/u [facility] as outpt. Ice.  Non Hodgkin's lymphoma  Vertigo: pt eval. Continue with meclizine.  Gait disturbance   Procedures:  none  Consultations:  Cardiology  Discharge Exam: Filed Vitals:   03/04/14 0543  BP: 101/44  Pulse: 55  Temp: 98.1 F (36.7 C)  Resp: 18    General: No distress.  Cardiovascular: S 1, S 2 RRR Respiratory: CTA  Discharge Instructions You were cared for by a hospitalist during your hospital stay. If you have any questions about your discharge medications or the care you received while you were in the hospital after you are discharged, you can call the unit and asked to speak with the hospitalist on call if the hospitalist that took care of you is not available. Once you are discharged, your primary care physician will handle any further medical issues. Please note that NO REFILLS for any discharge medications will be authorized once you are discharged, as it is imperative that you return to your primary care physician (or establish a relationship with a primary care physician if you do not have one) for  your aftercare needs so that they can reassess your need for medications and monitor your lab values.  Discharge Instructions   Diet - low sodium heart healthy    Complete by:  As directed      Increase activity slowly    Complete by:  As directed           Current Discharge Medication List    START taking these medications   Details   fludrocortisone (FLORINEF) 0.1 MG tablet Take 1 tablet (0.1 mg total) by mouth daily. Qty: 30 tablet, Refills: 1    midodrine (PROAMATINE) 5 MG tablet Take 1 tablet (5 mg total) by mouth 3 (three) times daily with meals. Qty: 90 tablet, Refills: 1      CONTINUE these medications which have NOT CHANGED   Details  atorvastatin (LIPITOR) 10 MG tablet Take 10 mg by mouth daily.     escitalopram (LEXAPRO) 5 MG tablet Take 1 tablet (5 mg total) by mouth daily.    Polyethyl Glycol-Propyl Glycol (SYSTANE OP) Place 1 drop into both eyes 3 (three) times daily as needed (dry eyes).      STOP taking these medications     metoprolol succinate (TOPROL XL) 25 MG 24 hr tablet      ramipril (ALTACE) 5 MG capsule        Allergies  Allergen Reactions  . Biaxin [Clarithromycin] Other (See Comments)    Severe stomach burning  . Zocor [Simvastatin] Rash   Follow-up Information   Follow up with [facility])    Specialty:  Home Health Services   Contact information:   [place] [address] [phone]       Follow up with Ascencion Dike, MD In 1 week.   Specialty:  Otolaryngology   Contact information:   [address] [place] [address] [phone]       Follow up with Drema Pry, DO In 1 week.   Specialty:  Internal Medicine   Contact information:   [place] [address] [phone]        The results of significant diagnostics from this hospitalization (including imaging, microbiology, ancillary and laboratory) are listed below for reference.    Significant Diagnostic Studies: Ct Head Wo Contrast  02/28/2014   CLINICAL DATA:  Near syncope, head injury after fall.  EXAM: CT HEAD WITHOUT CONTRAST  CT CERVICAL SPINE WITHOUT CONTRAST  TECHNIQUE: Multidetector CT imaging of the head and cervical spine was performed following the standard protocol without intravenous contrast. Multiplanar CT  image reconstructions of the cervical spine were also generated.  COMPARISON:  None.  FINDINGS: CT HEAD FINDINGS  There appears to be small nondisplaced fracture involving the right orbital roof and posterior wall the right frontal sinus with associated hemorrhage. Right frontal scalp laceration is noted. Mild diffuse cortical atrophy is noted. No mass effect or midline shift is noted. Ventricular size is within normal limits. There is no evidence of mass lesion, intracranial hemorrhage or acute infarction.  CT CERVICAL SPINE FINDINGS  No fracture or significant spondylolisthesis is noted. No significant degenerative disc disease is noted. Minimal degenerative changes are seen involving the posterior facet joints.  IMPRESSION: Right frontal scalp laceration is noted. There appears to be small nondisplaced fracture involving the anterior portion of the right orbital roof and posterior wall of the right frontal sinus with a small amount of hemorrhage present within the right frontal sinus. No acute intracranial  abnormality is noted.  No acute abnormality seen in the cervical spine.   Electronically Signed   By: Sabino Dick M.D.   On: 02/28/2014 14:49   Ct Cervical Spine Wo Contrast  02/28/2014   CLINICAL DATA:  Near syncope, head injury after fall.  EXAM: CT HEAD WITHOUT CONTRAST  CT CERVICAL SPINE WITHOUT CONTRAST  TECHNIQUE: Multidetector CT imaging of the head and cervical spine was performed following the standard protocol without intravenous contrast. Multiplanar CT image reconstructions of the cervical spine were also generated.  COMPARISON:  None.  FINDINGS: CT HEAD FINDINGS  There appears to be small nondisplaced fracture involving the right orbital roof and posterior wall the right frontal sinus with associated hemorrhage. Right frontal scalp laceration is noted. Mild diffuse cortical atrophy is noted. No mass effect or midline shift is noted. Ventricular size is within normal limits. There is no evidence  of mass lesion, intracranial hemorrhage or acute infarction.  CT CERVICAL SPINE FINDINGS  No fracture or significant spondylolisthesis is noted. No significant degenerative disc disease is noted. Minimal degenerative changes are seen involving the posterior facet joints.  IMPRESSION: Right frontal scalp laceration is noted. There appears to be small nondisplaced fracture involving the anterior portion of the right orbital roof and posterior wall of the right frontal sinus with a small amount of hemorrhage present within the right frontal sinus. No acute intracranial abnormality is noted.  No acute abnormality seen in the cervical spine.   Electronically Signed   By: Sabino Dick M.D.   On: 02/28/2014 14:49    Microbiology: No results found for this or any previous visit (from the past 240 hour(s)).   Labs: Basic Metabolic Panel:  Recent Labs Lab 02/28/14 1311  NA 132*  K 4.1  CL 95*  CO2 23  GLUCOSE 105*  BUN 6  CREATININE 0.74  CALCIUM 9.1   Liver Function Tests:  Recent Labs Lab 02/28/14 1311  AST 34  ALT 15  ALKPHOS 65  BILITOT 0.5  PROT 6.3  ALBUMIN 3.3*   No results found for this basename: LIPASE, AMYLASE,  in the last 168 hours No results found for this basename: AMMONIA,  in the last 168 hours CBC:  Recent Labs Lab 02/28/14 1311  WBC 4.9  NEUTROABS 2.3  HGB 13.1  HCT 37.1  MCV 92.3  PLT 143*   Cardiac Enzymes:  Recent Labs Lab 02/28/14 1323  TROPONINI <0.30   BNP: BNP (last 3 results) No results found for this basename: PROBNP,  in the last 8760 hours CBG:  Recent Labs Lab 02/28/14 1314  GLUCAP 120*       Signed:  Regalado, Belkys A  [facility] 03/04/2014, 8:25 AM

## 2014-03-06 ENCOUNTER — Telehealth: Payer: Self-pay | Admitting: Internal Medicine

## 2014-03-06 ENCOUNTER — Ambulatory Visit: Payer: Medicare Other | Admitting: Cardiology

## 2014-03-06 ENCOUNTER — Telehealth: Payer: Self-pay | Admitting: Cardiology

## 2014-03-06 NOTE — Telephone Encounter (Signed)
BP currently 151/104. Feels dizzy. This am when she woke it was 142/68. Took no florinef today and so far took midodrine only once. Discussed with Dr. Radford Pax. Instructed to stop florinef, No more midodrine today. Begin tomorrow midodrine 2.5 mg TID Pt informed.  She added that she hasn't taken the florinef except for when she was in the hospital. Also feels she is urinating less than normal despite drinking normally. Feels like her "body is rejecting this medicine, sensitive to it" Prefers to not take any of this medication, "i can live the other way, i cant live like this."  Advised her there is a degree of working to find the right dose for her but that she likely needs some since she's had a syncopal episode/fall.Durward Fortes I would inform Dr. Radford Pax.

## 2014-03-06 NOTE — Telephone Encounter (Signed)
Agree and patient has followup visit with me tomorrow

## 2014-03-06 NOTE — Telephone Encounter (Signed)
[facility] and they will be seeing her tomorrow

## 2014-03-06 NOTE — ED Provider Notes (Signed)
Medical screening examination/treatment/procedure(s) were conducted as a shared visit with non-physician practitioner(s) and myself.  I personally evaluated the patient during the encounter.   EKG Interpretation   Date/Time:  Tuesday February 28 2014 12:54:44 EDT Ventricular Rate:  53 PR Interval:  181 QRS Duration: 103 QT Interval:  507 QTC Calculation: 476 R Axis:   -24 Text Interpretation:  Sinus rhythm Atrial premature complex Borderline  left axis deviation st abnormalities diffusely Abnormal ekg since last  tracing no significant change Confirmed by MILLER  MD, BRIAN ([id number]) on  02/28/2014 4:30:04 PM     Patient with fall. Fracture and laceration. Wound closed.   Jasper Riling. Alvino Chapel, MD 03/06/14 [phone]

## 2014-03-06 NOTE — Telephone Encounter (Signed)
New Message  Pt called states that her BP is all over the place.. (No readings listed) Requests a call back to discuss medications. Please call back to discuss.

## 2014-03-07 ENCOUNTER — Ambulatory Visit: Payer: Medicare Other | Admitting: Cardiology

## 2014-03-07 ENCOUNTER — Telehealth: Payer: Self-pay | Admitting: Cardiology

## 2014-03-07 NOTE — Telephone Encounter (Signed)
New problem:   Per pt she is not feeling well dizzy, did not take the medication she spoke to the Dr about 10/5.

## 2014-03-07 NOTE — Telephone Encounter (Signed)
Patient states she did not come to appointment because she has vertigo (which she states is not new). This vertigo is complicated by nausea, which the patient attributes to bad yogurt she ate. Instructed patient to throw away bad yogurt she cannot accidentally eat it again. Pt took her BP while on the phone with me. 135/82. Patient not taking midodrine or florinef.  Pt requests to send to Dr. Radford Pax for review.

## 2014-03-07 NOTE — Telephone Encounter (Signed)
Pt.notified

## 2014-03-07 NOTE — Telephone Encounter (Signed)
Informed patient that per Dr. Radford Pax, it is advised that she NOT fly until her condition is stable. Patient inquired about letter to receive airline ticket reimbursement. Informed patient that a letter will be made for pick-up. Patient st her daughter can pick up the letter 10/7. Instructed patient to call the office if any other questions arise.

## 2014-03-07 NOTE — Telephone Encounter (Signed)
Ok to stay off meds but needs to wear her TED hose stockings

## 2014-03-07 NOTE — Telephone Encounter (Signed)
FYI

## 2014-03-07 NOTE — Telephone Encounter (Signed)
Please let patient know that heart monitor showed NSR with occasional extra heart beats from the top and bottom of the heart which are benign

## 2014-03-07 NOTE — Telephone Encounter (Signed)
New Message  Pt called states that she is going out of town will have to fly requests a call back to determine if its ok for her to fly to [place]. She is taking a care taker with her. Please call

## 2014-03-07 NOTE — Telephone Encounter (Signed)
To Lattie Haw, pt sees Dr. Radford Pax at 1:30 pm. Please relay results to patient.

## 2014-03-08 DIAGNOSIS — S023XXA Fracture of orbital floor, initial encounter for closed fracture: Secondary | ICD-10-CM | POA: Diagnosis not present

## 2014-03-09 ENCOUNTER — Telehealth: Payer: Self-pay | Admitting: Cardiology

## 2014-03-09 DIAGNOSIS — S028XXD Fractures of other specified skull and facial bones, subsequent encounter for fracture with routine healing: Secondary | ICD-10-CM | POA: Diagnosis not present

## 2014-03-09 DIAGNOSIS — F329 Major depressive disorder, single episode, unspecified: Secondary | ICD-10-CM | POA: Diagnosis not present

## 2014-03-09 DIAGNOSIS — I10 Essential (primary) hypertension: Secondary | ICD-10-CM | POA: Diagnosis not present

## 2014-03-09 DIAGNOSIS — R42 Dizziness and giddiness: Secondary | ICD-10-CM | POA: Diagnosis not present

## 2014-03-09 NOTE — Telephone Encounter (Signed)
Walk in pt Form " Note Dropped Off"  Will hold For [name]

## 2014-03-10 ENCOUNTER — Ambulatory Visit: Payer: Medicare Other | Admitting: Cardiology

## 2014-03-10 DIAGNOSIS — F329 Major depressive disorder, single episode, unspecified: Secondary | ICD-10-CM | POA: Diagnosis not present

## 2014-03-10 DIAGNOSIS — I10 Essential (primary) hypertension: Secondary | ICD-10-CM | POA: Diagnosis not present

## 2014-03-10 DIAGNOSIS — S028XXD Fractures of other specified skull and facial bones, subsequent encounter for fracture with routine healing: Secondary | ICD-10-CM | POA: Diagnosis not present

## 2014-03-10 DIAGNOSIS — R42 Dizziness and giddiness: Secondary | ICD-10-CM | POA: Diagnosis not present

## 2014-03-13 ENCOUNTER — Ambulatory Visit (INDEPENDENT_AMBULATORY_CARE_PROVIDER_SITE_OTHER): Payer: Medicare Other | Admitting: Internal Medicine

## 2014-03-13 ENCOUNTER — Encounter: Payer: Self-pay | Admitting: Internal Medicine

## 2014-03-13 ENCOUNTER — Encounter: Payer: Self-pay | Admitting: *Deleted

## 2014-03-13 VITALS — BP 110/72 | HR 73 | Temp 97.6°F | Resp 18 | Ht 66.0 in | Wt 102.0 lb

## 2014-03-13 DIAGNOSIS — I951 Orthostatic hypotension: Secondary | ICD-10-CM | POA: Diagnosis not present

## 2014-03-13 DIAGNOSIS — R634 Abnormal weight loss: Secondary | ICD-10-CM

## 2014-03-13 DIAGNOSIS — S0285XD Fracture of orbit, unspecified, subsequent encounter for fracture with routine healing: Secondary | ICD-10-CM

## 2014-03-13 DIAGNOSIS — I639 Cerebral infarction, unspecified: Secondary | ICD-10-CM | POA: Diagnosis not present

## 2014-03-13 DIAGNOSIS — R42 Dizziness and giddiness: Secondary | ICD-10-CM

## 2014-03-13 DIAGNOSIS — R630 Anorexia: Secondary | ICD-10-CM

## 2014-03-13 DIAGNOSIS — I10 Essential (primary) hypertension: Secondary | ICD-10-CM

## 2014-03-13 DIAGNOSIS — S028XXD Fractures of other specified skull and facial bones, subsequent encounter for fracture with routine healing: Secondary | ICD-10-CM

## 2014-03-13 DIAGNOSIS — R55 Syncope and collapse: Secondary | ICD-10-CM | POA: Diagnosis not present

## 2014-03-13 DIAGNOSIS — S0990XD Unspecified injury of head, subsequent encounter: Secondary | ICD-10-CM

## 2014-03-13 MED ORDER — PROMETHAZINE HCL 12.5 MG PO TABS: 12.5000 mg | ORAL_TABLET | Freq: Four times a day (QID) | ORAL | Status: DC | PRN

## 2014-03-13 NOTE — Progress Notes (Signed)
Pre visit review using our clinic review tool, if applicable. No additional management support is needed unless otherwise documented below in the visit note. 

## 2014-03-13 NOTE — Progress Notes (Signed)
Subjective:    Patient ID: [name], female    DOB: January 27, 1934, 80 y.o.   MRN: [id number]  HPI 80 year old patient who is seen today following a recent hospital discharge. She was evaluated for syncope thought related to orthostatic hypotension;  she suffered an orbital fracture and closed head injury. She did require sutures involving the right eyebrow, but these were not completely removed at followup. She continues to complain of dizziness, weight loss, weakness, and general sense of unwellness.  These problems have been fairly chronic.  She also has a history of chronic vertigo.  The patient was discharged on Florinef and midodrine.  The latter was discontinued due to poor tolerance.  She has scheduled followup with endocrinology as well as cardiology. She present has walked with a walker.  Post hospital discharge.  There has been no further syncope  Hospital records reviewed  Past Medical History  Diagnosis Date  . HTN (hypertension)   . Osteoporosis   . TIA (transient ischemic attack)   . Mitral valve prolapse     a. Echo 01/2014: mild MVP.  Marland Kitchen Mitral regurgitation     a. Echo 01/2014: mod MR.  . Ascending aorta dilation     a. Echo 01/2014: mildly dilated.  . Abnormal TSH     a. Labs 01/2014:  low TSH 0.26 with free T4 1.07.  . Non-Hodgkin lymphoma     a. Tx with high dose chemotherapy at [facility] years ago. Pt reported  associated heart valve damage from chemotherapy.    History   Social History  . Marital Status: Widowed    Spouse Name: N/A    Number of Children: 12  . Years of Education: college   Occupational History  .      Retired   Social History Main Topics  . Smoking status: Never Smoker   . Smokeless tobacco: Never Used  . Alcohol Use: No  . Drug Use: No  . Sexual Activity: Not on file   Other Topics Concern  . Not on file   Social History Narrative   Patient lives with daughter when she is in [place]   She is widowed. Husband passed away 33  yrs ago.   Retired   Veterinary surgeon.   Right handed.   Caffeine - None    Past Surgical History  Procedure Laterality Date  . Cataract extraction    . Oral tooth      Family History  Problem Relation Age of Onset  . Coronary artery disease Father 49  . Brain cancer Sister     Allergies  Allergen Reactions  . Biaxin [Clarithromycin] Other (See Comments)    Severe stomach burning  . Midodrine Other (See Comments)    Rash, headache, shakey  . Zocor [Simvastatin] Rash    Current Outpatient Prescriptions on File Prior to Visit  Medication Sig Dispense Refill  . atorvastatin (LIPITOR) 10 MG tablet Take 10 mg by mouth daily.       Marland Kitchen escitalopram (LEXAPRO) 5 MG tablet Take 1 tablet (5 mg total) by mouth daily.      Vladimir Faster Glycol-Propyl Glycol (SYSTANE OP) Place 1 drop into both eyes 3 (three) times daily as needed (dry eyes).       No current facility-administered medications on file prior to visit.    BP 110/72  Pulse 73  Temp(Src) 97.6 F (36.4 C) (Oral)  Resp 18  Ht 5\' 6"  (1.676 m)  Wt 102 lb (46.267 kg)  BMI  16.47 kg/m2  SpO2 96%      Review of Systems  Constitutional: Positive for activity change, appetite change and fatigue.  HENT: Negative for congestion, dental problem, hearing loss, rhinorrhea, sinus pressure, sore throat and tinnitus.   Eyes: Negative for pain, discharge and visual disturbance.  Respiratory: Negative for cough and shortness of breath.   Cardiovascular: Negative for chest pain, palpitations and leg swelling.  Gastrointestinal: Negative for nausea, vomiting, abdominal pain, diarrhea, constipation, blood in stool and abdominal distention.  Genitourinary: Negative for dysuria, urgency, frequency, hematuria, flank pain, vaginal bleeding, vaginal discharge, difficulty urinating, vaginal pain and pelvic pain.  Musculoskeletal: Negative for arthralgias, gait problem and joint swelling.  Skin: Negative for rash.  Neurological: Positive  for weakness and light-headedness. Negative for dizziness, syncope, speech difficulty, numbness and headaches.  Hematological: Negative for adenopathy.  Psychiatric/Behavioral: Negative for behavioral problems, dysphoric mood and agitation. The patient is nervous/anxious.        Objective:   Physical Exam  Constitutional: She is oriented to person, place, and time. She appears well-developed and well-nourished.  Blood pressure 110/70 without significant orthostatic change.  No symptoms with standing  Appears chronically ill  HENT:  Head: Normocephalic.  Right Ear: External ear normal.  Left Ear: External ear normal.  Mouth/Throat: Oropharynx is clear and moist.  Eyes: Conjunctivae and EOM are normal. Pupils are equal, round, and reactive to light.  Neck: Normal range of motion. Neck supple. No thyromegaly present.  Cardiovascular: Normal rate, regular rhythm, normal heart sounds and intact distal pulses.   Pulmonary/Chest: Effort normal and breath sounds normal.  Abdominal: Soft. Bowel sounds are normal. She exhibits no mass. There is no tenderness.  Musculoskeletal: Normal range of motion.  Lymphadenopathy:    She has no cervical adenopathy.  Neurological: She is alert and oriented to person, place, and time.  Skin: Skin is warm and dry. No rash noted.  Healing laceration over the right eyebrow Fine suture material still in place.  Attempts to remove not successful  Psychiatric: She has a normal mood and affect. Her behavior is normal.          Assessment & Plan:   History of orthostatic hypotension.  We'll maintain off prior antihypertensive medication.  We'll hold Florinef at this time History of vertigo History of hypertension.  We'll observe off medication Anorexia, weight loss.  We'll recheck in 4 weeks History of vertigo Status post orbital fracture and closed head injury Status post suturing right eyebrow.  Patient referred back to surgeon for complete suture  removal  Recheck here in 4 weeks Cardiology and endocrinology followup

## 2014-03-13 NOTE — Patient Instructions (Signed)
Endocrinology followup Return in one month for followup here with Dr.Yoo

## 2014-03-14 ENCOUNTER — Telehealth: Payer: Self-pay | Admitting: Internal Medicine

## 2014-03-14 DIAGNOSIS — F329 Major depressive disorder, single episode, unspecified: Secondary | ICD-10-CM | POA: Diagnosis not present

## 2014-03-14 DIAGNOSIS — S028XXD Fractures of other specified skull and facial bones, subsequent encounter for fracture with routine healing: Secondary | ICD-10-CM | POA: Diagnosis not present

## 2014-03-14 DIAGNOSIS — I10 Essential (primary) hypertension: Secondary | ICD-10-CM | POA: Diagnosis not present

## 2014-03-14 DIAGNOSIS — R42 Dizziness and giddiness: Secondary | ICD-10-CM | POA: Diagnosis not present

## 2014-03-14 NOTE — Telephone Encounter (Signed)
emmi emailed °

## 2014-03-15 ENCOUNTER — Encounter: Payer: Self-pay | Admitting: Family Medicine

## 2014-03-15 ENCOUNTER — Ambulatory Visit (INDEPENDENT_AMBULATORY_CARE_PROVIDER_SITE_OTHER): Payer: Medicare Other | Admitting: Family Medicine

## 2014-03-15 ENCOUNTER — Telehealth: Payer: Self-pay

## 2014-03-15 VITALS — BP 120/74 | HR 58 | Temp 97.4°F | Wt 104.0 lb

## 2014-03-15 DIAGNOSIS — R300 Dysuria: Secondary | ICD-10-CM | POA: Diagnosis not present

## 2014-03-15 DIAGNOSIS — I639 Cerebral infarction, unspecified: Secondary | ICD-10-CM | POA: Diagnosis not present

## 2014-03-15 DIAGNOSIS — L298 Other pruritus: Secondary | ICD-10-CM | POA: Diagnosis not present

## 2014-03-15 DIAGNOSIS — R946 Abnormal results of thyroid function studies: Secondary | ICD-10-CM | POA: Diagnosis not present

## 2014-03-15 DIAGNOSIS — N898 Other specified noninflammatory disorders of vagina: Secondary | ICD-10-CM

## 2014-03-15 DIAGNOSIS — R7989 Other specified abnormal findings of blood chemistry: Secondary | ICD-10-CM

## 2014-03-15 LAB — POCT URINALYSIS DIPSTICK
BILIRUBIN UA: NEGATIVE
Glucose, UA: NEGATIVE
Ketones, UA: NEGATIVE
Leukocytes, UA: NEGATIVE
NITRITE UA: NEGATIVE
PH UA: 8.5
Protein, UA: NEGATIVE
RBC UA: NEGATIVE
Spec Grav, UA: 1.01
UROBILINOGEN UA: 0.2

## 2014-03-15 MED ORDER — FLUCONAZOLE 150 MG PO TABS: 150.0000 mg | ORAL_TABLET | Freq: Once | ORAL | Status: DC

## 2014-03-15 NOTE — Telephone Encounter (Signed)
Dr. Radford Pax received a "walk in" form today for [name] coming to pick up her health release from Oct. 7th.  Informed [name] that her letter was placed at the front of the office on Oct. 6th, and is still there.  Patient st she will pick it up soon.

## 2014-03-15 NOTE — Progress Notes (Signed)
   Subjective:    Patient ID: [name], female    DOB: Jan 05, 1934, 80 y.o.   MRN: [id number]  HPI Acute visit for vaginal itching. Patient had recent fall with right orbital fracture and laceration. She had orthostasis and after stopping one of her medications her orthostatic symptoms have resolved.  Regarding her vaginal symptoms she has not had any actual discharge. No recent antibiotics. No history of diabetes. No recent prednisone use. She has occasional burning with urination.  Patient apparently had low TSH with normal free T4 and was advised see endocrinologist. Repeat TSH in September was normal range. She has some chronic fatigue. Recent a.m. cortisol level normal.   Past Medical History  Diagnosis Date  . HTN (hypertension)   . Osteoporosis   . TIA (transient ischemic attack)   . Mitral valve prolapse     a. Echo 01/2014: mild MVP.  Marland Kitchen Mitral regurgitation     a. Echo 01/2014: mod MR.  . Ascending aorta dilation     a. Echo 01/2014: mildly dilated.  . Abnormal TSH     a. Labs 01/2014:  low TSH 0.26 with free T4 1.07.  . Non-Hodgkin lymphoma     a. Tx with high dose chemotherapy at [facility] years ago. Pt reported  associated heart valve damage from chemotherapy.   Past Surgical History  Procedure Laterality Date  . Cataract extraction    . Oral tooth      reports that she has never smoked. She has never used smokeless tobacco. She reports that she does not drink alcohol or use illicit drugs. family history includes Brain cancer in her sister; Coronary artery disease (age of onset: 55) in her father. Allergies  Allergen Reactions  . Biaxin [Clarithromycin] Other (See Comments)    Severe stomach burning  . Midodrine Other (See Comments)    Rash, headache, shakey  . Zocor [Simvastatin] Rash      Review of Systems  Constitutional: Positive for fatigue.  Eyes: Negative for visual disturbance.  Respiratory: Negative for cough, chest tightness, shortness of breath and  wheezing.   Cardiovascular: Negative for chest pain, palpitations and leg swelling.  Endocrine: Negative for polydipsia and polyuria.  Neurological: Negative for seizures, syncope, weakness, light-headedness and headaches.       Objective:   Physical Exam  Constitutional: She is oriented to person, place, and time. She appears well-developed and well-nourished.  HENT:  Healed laceration right frontal region  Neck: Neck supple.  Cardiovascular: Normal rate and regular rhythm.   Pulmonary/Chest: Effort normal and breath sounds normal. No respiratory distress. She has no wheezes. She has no rales.  Musculoskeletal: She exhibits no edema.  Neurological: She is alert and oriented to person, place, and time.          Assessment & Plan:  #1 vaginitis symptoms. She had mild dysuria but urine dipstick is completely normal. Differential is yeast vaginitis versus atrophic vaginitis. Fluconazole 150 mg one tablet.  If noot resolving one to 2 weeks followup for pelvic exam and further evaluation-wet prep, etc #2 recent abnormal thyroid functions. Patient requesting referral to endocrinology. We'll set up per family request, though most recent TSH and free T4 normal.

## 2014-03-15 NOTE — Patient Instructions (Signed)
Follow up in 1-2 weeks if vaginitis symptoms not resolved.

## 2014-03-15 NOTE — Progress Notes (Signed)
Pre visit review using our clinic review tool, if applicable. No additional management support is needed unless otherwise documented below in the visit note. 

## 2014-03-16 DIAGNOSIS — I10 Essential (primary) hypertension: Secondary | ICD-10-CM | POA: Diagnosis not present

## 2014-03-16 DIAGNOSIS — S028XXD Fractures of other specified skull and facial bones, subsequent encounter for fracture with routine healing: Secondary | ICD-10-CM | POA: Diagnosis not present

## 2014-03-16 DIAGNOSIS — F329 Major depressive disorder, single episode, unspecified: Secondary | ICD-10-CM | POA: Diagnosis not present

## 2014-03-16 DIAGNOSIS — R42 Dizziness and giddiness: Secondary | ICD-10-CM | POA: Diagnosis not present

## 2014-03-17 DIAGNOSIS — S028XXD Fractures of other specified skull and facial bones, subsequent encounter for fracture with routine healing: Secondary | ICD-10-CM | POA: Diagnosis not present

## 2014-03-17 DIAGNOSIS — R42 Dizziness and giddiness: Secondary | ICD-10-CM | POA: Diagnosis not present

## 2014-03-17 DIAGNOSIS — F329 Major depressive disorder, single episode, unspecified: Secondary | ICD-10-CM | POA: Diagnosis not present

## 2014-03-17 DIAGNOSIS — I10 Essential (primary) hypertension: Secondary | ICD-10-CM | POA: Diagnosis not present

## 2014-03-21 ENCOUNTER — Ambulatory Visit (INDEPENDENT_AMBULATORY_CARE_PROVIDER_SITE_OTHER): Payer: Medicare Other | Admitting: Internal Medicine

## 2014-03-21 ENCOUNTER — Encounter: Payer: Self-pay | Admitting: Internal Medicine

## 2014-03-21 VITALS — BP 112/78 | HR 88 | Temp 97.9°F | Resp 18 | Ht 66.0 in | Wt 100.0 lb

## 2014-03-21 DIAGNOSIS — R55 Syncope and collapse: Secondary | ICD-10-CM | POA: Diagnosis not present

## 2014-03-21 DIAGNOSIS — R634 Abnormal weight loss: Secondary | ICD-10-CM | POA: Diagnosis not present

## 2014-03-21 DIAGNOSIS — S0990XD Unspecified injury of head, subsequent encounter: Secondary | ICD-10-CM | POA: Diagnosis not present

## 2014-03-21 DIAGNOSIS — I10 Essential (primary) hypertension: Secondary | ICD-10-CM

## 2014-03-21 DIAGNOSIS — I639 Cerebral infarction, unspecified: Secondary | ICD-10-CM

## 2014-03-21 NOTE — Progress Notes (Signed)
Pre visit review using our clinic review tool, if applicable. No additional management support is needed unless otherwise documented below in the visit note. 

## 2014-03-21 NOTE — Progress Notes (Signed)
Subjective:    Patient ID: [name], female    DOB: Oct 04, 1933, 80 y.o.   MRN: [id number]  HPI  Wt Readings from Last 3 Encounters:  03/21/14 100 lb (45.36 kg)  03/15/14 104 lb (47.174 kg)  03/13/14 102 lb (46.23 kg)   80 year old patient, who was hospitalized recently for a closed head injury and orbital fracture.  She continues to do a better.  She is eating well and becoming slightly stronger, although still no weight gain.  She is contemplating a trip to [place].  No further syncope.  Past Medical History  Diagnosis Date  . HTN (hypertension)   . Osteoporosis   . TIA (transient ischemic attack)   . Mitral valve prolapse     a. Echo 01/2014: mild MVP.  Marland Kitchen Mitral regurgitation     a. Echo 01/2014: mod MR.  . Ascending aorta dilation     a. Echo 01/2014: mildly dilated.  . Abnormal TSH     a. Labs 01/2014:  low TSH 0.26 with free T4 1.07.  . Non-Hodgkin lymphoma     a. Tx with high dose chemotherapy at [facility] years ago. Pt reported  associated heart valve damage from chemotherapy.    History   Social History  . Marital Status: Widowed    Spouse Name: N/A    Number of Children: 7  . Years of Education: college   Occupational History  .      Retired   Social History Main Topics  . Smoking status: Never Smoker   . Smokeless tobacco: Never Used  . Alcohol Use: No  . Drug Use: No  . Sexual Activity: Not on file   Other Topics Concern  . Not on file   Social History Narrative   Patient lives with daughter when she is in [place]   She is widowed. Husband passed away 48 yrs ago.   Retired   Veterinary surgeon.   Right handed.   Caffeine - None    Past Surgical History  Procedure Laterality Date  . Cataract extraction    . Oral tooth      Family History  Problem Relation Age of Onset  . Coronary artery disease Father 72  . Brain cancer Sister     Allergies  Allergen Reactions  . Biaxin [Clarithromycin] Other (See Comments)    Severe stomach burning    . Midodrine Other (See Comments)    Rash, headache, shakey  . Zocor [Simvastatin] Rash    Current Outpatient Prescriptions on File Prior to Visit  Medication Sig Dispense Refill  . atorvastatin (LIPITOR) 10 MG tablet Take 10 mg by mouth daily.       Marland Kitchen escitalopram (LEXAPRO) 5 MG tablet Take 1 tablet (5 mg total) by mouth daily.      Vladimir Faster Glycol-Propyl Glycol (SYSTANE OP) Place 1 drop into both eyes 3 (three) times daily as needed (dry eyes).       No current facility-administered medications on file prior to visit.    BP 112/78  Pulse 88  Temp(Src) 97.9 F (36.6 C) (Oral)  Resp 18  Ht 5\' 6"  (1.676 m)  Wt 100 lb (45.36 kg)  BMI 16.15 kg/m2  SpO2 98%      Review of Systems  Constitutional: Positive for fatigue.  HENT: Negative for congestion, dental problem, hearing loss, rhinorrhea, sinus pressure, sore throat and tinnitus.   Eyes: Negative for pain, discharge and visual disturbance.  Respiratory: Negative for cough and shortness of breath.  Cardiovascular: Negative for chest pain, palpitations and leg swelling.  Gastrointestinal: Negative for nausea, vomiting, abdominal pain, diarrhea, constipation, blood in stool and abdominal distention.  Genitourinary: Negative for dysuria, urgency, frequency, hematuria, flank pain, vaginal bleeding, vaginal discharge, difficulty urinating, vaginal pain and pelvic pain.  Musculoskeletal: Negative for arthralgias, gait problem and joint swelling.  Skin: Negative for rash.  Neurological: Positive for weakness and light-headedness. Negative for dizziness, syncope, speech difficulty, numbness and headaches.  Hematological: Negative for adenopathy.  Psychiatric/Behavioral: Negative for behavioral problems, dysphoric mood and agitation. The patient is not nervous/anxious.        Objective:   Physical Exam  Constitutional: She is oriented to person, place, and time. She appears well-developed and well-nourished. No distress.   Appear slightly weak, but alert and appropriate, and in no distress.  Blood pressure 110/80   HENT:  Head: Normocephalic.  Right Ear: External ear normal.  Left Ear: External ear normal.  Mouth/Throat: Oropharynx is clear and moist.  Eyes: Conjunctivae and EOM are normal. Pupils are equal, round, and reactive to light.  Neck: Normal range of motion. Neck supple. No thyromegaly present.  Cardiovascular: Normal rate, regular rhythm, normal heart sounds and intact distal pulses.   Pulmonary/Chest: Effort normal and breath sounds normal.  Abdominal: Soft. Bowel sounds are normal. She exhibits no mass. There is no tenderness.  Musculoskeletal: Normal range of motion.  Lymphadenopathy:    She has no cervical adenopathy.  Neurological: She is alert and oriented to person, place, and time.  Skin: Skin is warm and dry. No rash noted.  Healing laceration in the right supraorbital area.  No evidence of retained sutures  Psychiatric: She has a normal mood and affect. Her behavior is normal.          Assessment & Plan:   Status post closed head injury History of syncope Status post orbital fracture  No change in medication High calorie diet recommended.  We'll continue her efforts at weight gain Recheck in 3 months or as needed Continue to advance her activity level

## 2014-03-21 NOTE — Patient Instructions (Signed)
Return in 3 months for follow-up  

## 2014-03-22 ENCOUNTER — Encounter (HOSPITAL_COMMUNITY): Payer: Self-pay | Admitting: Emergency Medicine

## 2014-03-22 ENCOUNTER — Observation Stay (HOSPITAL_COMMUNITY)
Admission: EM | Admit: 2014-03-22 | Discharge: 2014-03-24 | Disposition: A | Discharge status: Home / Self Care | Payer: Medicare Other | Attending: Cardiology | Admitting: Cardiology

## 2014-03-22 ENCOUNTER — Telehealth: Payer: Self-pay | Admitting: Cardiology

## 2014-03-22 ENCOUNTER — Observation Stay (HOSPITAL_COMMUNITY): Payer: Medicare Other

## 2014-03-22 DIAGNOSIS — I4891 Unspecified atrial fibrillation: Secondary | ICD-10-CM | POA: Diagnosis not present

## 2014-03-22 DIAGNOSIS — Z79899 Other long term (current) drug therapy: Secondary | ICD-10-CM | POA: Diagnosis not present

## 2014-03-22 DIAGNOSIS — I10 Essential (primary) hypertension: Secondary | ICD-10-CM | POA: Insufficient documentation

## 2014-03-22 DIAGNOSIS — I340 Nonrheumatic mitral (valve) insufficiency: Secondary | ICD-10-CM | POA: Diagnosis present

## 2014-03-22 DIAGNOSIS — I341 Nonrheumatic mitral (valve) prolapse: Secondary | ICD-10-CM | POA: Diagnosis present

## 2014-03-22 DIAGNOSIS — I480 Paroxysmal atrial fibrillation: Secondary | ICD-10-CM | POA: Diagnosis present

## 2014-03-22 DIAGNOSIS — I493 Ventricular premature depolarization: Secondary | ICD-10-CM | POA: Diagnosis present

## 2014-03-22 DIAGNOSIS — J8410 Pulmonary fibrosis, unspecified: Secondary | ICD-10-CM | POA: Diagnosis not present

## 2014-03-22 DIAGNOSIS — R002 Palpitations: Secondary | ICD-10-CM | POA: Diagnosis not present

## 2014-03-22 DIAGNOSIS — I471 Supraventricular tachycardia: Secondary | ICD-10-CM | POA: Diagnosis present

## 2014-03-22 DIAGNOSIS — I472 Ventricular tachycardia: Secondary | ICD-10-CM

## 2014-03-22 DIAGNOSIS — Z8673 Personal history of transient ischemic attack (TIA), and cerebral infarction without residual deficits: Secondary | ICD-10-CM | POA: Diagnosis not present

## 2014-03-22 DIAGNOSIS — R000 Tachycardia, unspecified: Secondary | ICD-10-CM | POA: Diagnosis not present

## 2014-03-22 DIAGNOSIS — E785 Hyperlipidemia, unspecified: Secondary | ICD-10-CM | POA: Diagnosis present

## 2014-03-22 DIAGNOSIS — E43 Unspecified severe protein-calorie malnutrition: Secondary | ICD-10-CM | POA: Insufficient documentation

## 2014-03-22 DIAGNOSIS — I4729 Other ventricular tachycardia: Secondary | ICD-10-CM

## 2014-03-22 DIAGNOSIS — M810 Age-related osteoporosis without current pathological fracture: Secondary | ICD-10-CM | POA: Diagnosis not present

## 2014-03-22 DIAGNOSIS — I951 Orthostatic hypotension: Secondary | ICD-10-CM | POA: Diagnosis present

## 2014-03-22 DIAGNOSIS — R011 Cardiac murmur, unspecified: Secondary | ICD-10-CM | POA: Diagnosis not present

## 2014-03-22 DIAGNOSIS — R634 Abnormal weight loss: Secondary | ICD-10-CM

## 2014-03-22 DIAGNOSIS — I4719 Other supraventricular tachycardia: Secondary | ICD-10-CM | POA: Diagnosis present

## 2014-03-22 HISTORY — DX: Unspecified protein-calorie malnutrition: E46

## 2014-03-22 HISTORY — DX: Ventricular tachycardia: I47.2

## 2014-03-22 HISTORY — DX: Syncope and collapse: R55

## 2014-03-22 HISTORY — DX: Other supraventricular tachycardia: I47.19

## 2014-03-22 HISTORY — DX: Paroxysmal atrial fibrillation: I48.0

## 2014-03-22 HISTORY — DX: Ventricular premature depolarization: I49.3

## 2014-03-22 HISTORY — DX: Other ventricular tachycardia: I47.29

## 2014-03-22 HISTORY — DX: Anxiety disorder, unspecified: F41.9

## 2014-03-22 HISTORY — DX: Hyperlipidemia, unspecified: E78.5

## 2014-03-22 HISTORY — DX: Orthostatic hypotension: I95.1

## 2014-03-22 HISTORY — DX: Supraventricular tachycardia: I47.1

## 2014-03-22 HISTORY — DX: Abnormal findings on diagnostic imaging of other specified body structures: R93.89

## 2014-03-22 LAB — CBC WITH DIFFERENTIAL/PLATELET
BASOS ABS: 0 10*3/uL (ref 0.0–0.1)
BASOS PCT: 1 % (ref 0–1)
EOS ABS: 0.1 10*3/uL (ref 0.0–0.7)
Eosinophils Relative: 1 % (ref 0–5)
HCT: 41.2 % (ref 36.0–46.0)
Hemoglobin: 14.5 g/dL (ref 12.0–15.0)
Lymphocytes Relative: 33 % (ref 12–46)
Lymphs Abs: 1.6 10*3/uL (ref 0.7–4.0)
MCH: 33.3 pg (ref 26.0–34.0)
MCHC: 35.2 g/dL (ref 30.0–36.0)
MCV: 94.5 fL (ref 78.0–100.0)
Monocytes Absolute: 0.4 10*3/uL (ref 0.1–1.0)
Monocytes Relative: 8 % (ref 3–12)
NEUTROS ABS: 2.8 10*3/uL (ref 1.7–7.7)
NEUTROS PCT: 57 % (ref 43–77)
PLATELETS: 192 10*3/uL (ref 150–400)
RBC: 4.36 MIL/uL (ref 3.87–5.11)
RDW: 13.7 % (ref 11.5–15.5)
WBC: 4.9 10*3/uL (ref 4.0–10.5)

## 2014-03-22 LAB — BASIC METABOLIC PANEL
Anion gap: 16 — ABNORMAL HIGH (ref 5–15)
BUN: 10 mg/dL (ref 6–23)
CHLORIDE: 101 meq/L (ref 96–112)
CO2: 25 mEq/L (ref 19–32)
Calcium: 9.8 mg/dL (ref 8.4–10.5)
Creatinine, Ser: 0.69 mg/dL (ref 0.50–1.10)
GFR, EST NON AFRICAN AMERICAN: 82 mL/min — AB (ref >90)
Glucose, Bld: 94 mg/dL (ref 70–99)
POTASSIUM: 3.9 meq/L (ref 3.7–5.3)
SODIUM: 142 meq/L (ref 137–147)

## 2014-03-22 LAB — TROPONIN I

## 2014-03-22 LAB — COMPREHENSIVE METABOLIC PANEL
ALT: 11 U/L (ref 0–35)
AST: 28 U/L (ref 0–37)
Albumin: 3.7 g/dL (ref 3.5–5.2)
Alkaline Phosphatase: 83 U/L (ref 39–117)
Anion gap: 17 — ABNORMAL HIGH (ref 5–15)
BUN: 10 mg/dL (ref 6–23)
CHLORIDE: 100 meq/L (ref 96–112)
CO2: 24 meq/L (ref 19–32)
Calcium: 9.7 mg/dL (ref 8.4–10.5)
Creatinine, Ser: 0.69 mg/dL (ref 0.50–1.10)
GFR calc Af Amer: >90 mL/min (ref >90)
GFR calc non Af Amer: 82 mL/min — ABNORMAL LOW (ref >90)
Glucose, Bld: 92 mg/dL (ref 70–99)
Potassium: 3.9 mEq/L (ref 3.7–5.3)
Sodium: 141 mEq/L (ref 137–147)
Total Bilirubin: 0.7 mg/dL (ref 0.3–1.2)
Total Protein: 7.1 g/dL (ref 6.0–8.3)

## 2014-03-22 LAB — PROTIME-INR
INR: 0.98 (ref 0.00–1.49)
PROTHROMBIN TIME: 13.1 s (ref 11.6–15.2)

## 2014-03-22 LAB — PHOSPHORUS: Phosphorus: 3.5 mg/dL (ref 2.3–4.6)

## 2014-03-22 LAB — APTT: aPTT: 24 seconds (ref 24–37)

## 2014-03-22 LAB — MAGNESIUM: MAGNESIUM: 1.9 mg/dL (ref 1.5–2.5)

## 2014-03-22 MED ORDER — ATORVASTATIN CALCIUM 10 MG PO TABS
10.0000 mg | ORAL_TABLET | Freq: Every day | ORAL | Status: DC
  Administered 2014-03-22 – 2014-03-23 (×2): 10 mg via ORAL
  Filled 2014-03-22 (×2): qty 1

## 2014-03-22 MED ORDER — APIXABAN 5 MG PO TABS
5.0000 mg | ORAL_TABLET | Freq: Two times a day (BID) | ORAL | Status: DC
  Administered 2014-03-22 – 2014-03-24 (×5): 5 mg via ORAL
  Filled 2014-03-22 (×6): qty 1

## 2014-03-22 MED ORDER — METOPROLOL TARTRATE 25 MG PO TABS
25.0000 mg | ORAL_TABLET | Freq: Once | ORAL | Status: DC
  Filled 2014-03-22: qty 1

## 2014-03-22 MED ORDER — SODIUM CHLORIDE 0.9 % IV SOLN
Freq: Once | INTRAVENOUS | Status: AC
  Administered 2014-03-22: 12:00:00 via INTRAVENOUS

## 2014-03-22 MED ORDER — ASPIRIN 81 MG PO CHEW
324.0000 mg | CHEWABLE_TABLET | Freq: Once | ORAL | Status: AC
  Administered 2014-03-22: 324 mg via ORAL
  Filled 2014-03-22: qty 4

## 2014-03-22 MED ORDER — METOPROLOL SUCCINATE ER 25 MG PO TB24
12.5000 mg | ORAL_TABLET | Freq: Every day | ORAL | Status: DC
  Administered 2014-03-22 – 2014-03-24 (×3): 12.5 mg via ORAL
  Filled 2014-03-22 (×3): qty 1

## 2014-03-22 MED ORDER — ESCITALOPRAM OXALATE 10 MG PO TABS
5.0000 mg | ORAL_TABLET | Freq: Every day | ORAL | Status: DC
  Administered 2014-03-22 – 2014-03-24 (×3): 5 mg via ORAL
  Filled 2014-03-22 (×3): qty 1

## 2014-03-22 MED ORDER — SODIUM CHLORIDE 0.9 % IV BOLUS (SEPSIS): 1000.0000 mL | Freq: Once | INTRAVENOUS | Status: DC

## 2014-03-22 MED ORDER — HEPARIN SODIUM (PORCINE) 5000 UNIT/ML IJ SOLN: 5000.0000 [IU] | Freq: Three times a day (TID) | INTRAMUSCULAR | Status: DC

## 2014-03-22 NOTE — ED Notes (Signed)
Dr. Kathrynn Humble notified that pt returned to [facility]. EKG repeated and placed in chart

## 2014-03-22 NOTE — ED Notes (Signed)
Cardiology at bedside.

## 2014-03-22 NOTE — H&P (Addendum)
Patient ID: [name] MRN: [id number], DOB/AGE: 06/24/1933   Admit date: 03/22/2014   Primary Physician: Drema Pry, DO Primary Cardiologist: Dr. Fransico Him  Pt. Profile:  80 year old woman admitted with rapid palpitations and found to be in new atrial fibrillation.  She had a past history of orthostatic hypotension and syncope and was hospitalized here from 03/01/14 until 03/04/14 for syncope and head injury.  Problem List  Past Medical History  Diagnosis Date  . HTN (hypertension)   . Osteoporosis   . TIA (transient ischemic attack)   . Mitral valve prolapse     a. Echo 01/2014: mild MVP.  Marland Kitchen Mitral regurgitation     a. Echo 01/2014: mod MR.  . Ascending aorta dilation     a. Echo 01/2014: mildly dilated.  . Abnormal TSH     a. Labs 01/2014:  low TSH 0.26 with free T4 1.07.  . Non-Hodgkin lymphoma     a. Tx with high dose chemotherapy at [facility] years ago. Pt reported  associated heart valve damage from chemotherapy.    Past Surgical History  Procedure Laterality Date  . Cataract extraction    . Oral tooth       Allergies  Allergies  Allergen Reactions  . Biaxin [Clarithromycin] Other (See Comments)    Severe stomach burning  . Midodrine Other (See Comments)    Rash, headache, shakey  . Zocor [Simvastatin] Rash    HPI  The patient has a complex medical and cardiac history.  She has a remote history of non-Hodgkin's gastric lymphoma.  She states that at the time of her chemotherapy she had a TIA.  She was living in [place] at that time.  4 years ago she had a cardiac catheterization in [place] and was told that she does not have any blockages. She has a past history of mitral valve prolapse and mitral regurgitation. She has a past history of syncope.  She has only had syncope while standing in line.  In September 2015 she had an event monitor interrogation which did not show any arrhythmias.  She has had a past history of palpitations.  She has had a  history of previous hypertension and had been previously on long-term metoprolol.  Her metoprolol was stopped during her last hospital stay because of her syncope.  As a result of her syncopal episode on 03/01/14 she suffered a laceration above her right eyebrow and had a nondisplaced fracture of the superior aspect of the right orbit.  Dr.Teoh is her ENT physician. The patient was discharged home on Midodrine for orthostatic hypotension and her metoprolol was stopped.  However the patient did not tolerate midodrine and stopped it.  Recently she has been on no blood pressure medication.  She never started the Florinef that was discussed at discharge.  She saw her PCP yesterday and her blood pressure was normal at 112/78. Yesterday at her PCP visit she inquired as to whether she could fly to [place] today with her daughter.  She was given permission to make the trip.  She states that she was somewhat caught off guard and went home to try to get ready for the trip in a hurry.  Last night her blood pressure fell high to her and she checked it and it was 299 systolic at home. She awoke about midnight and noticed that her heart was rapid and irregular.  She went back to sleep.  She awoke 2 more times and each time she felt the irregular  pulse.  This morning the pulse was still irregular and she came to the emergency room where her initial electrocardiogram confirmed atrial fibrillation with rapid ventricular response.  Prior to receiving the ordered dose of metoprolol, she went back into normal sinus rhythm on her own.  She has not been experiencing any chest pain or angina pectoris.  She has not had any recurrent dizzy spells or syncope.  She is concerned about progressive weight loss over the past 6 months.  She has had thyroid function studies which are normal.  We will update her chest x-ray.  She has not had any change in bowel habits hematochezia or melena and she is not anemic.  Home Medications  Prior to  Admission medications   Medication Sig Start Date End Date Taking? Authorizing Provider  atorvastatin (LIPITOR) 10 MG tablet Take 10 mg by mouth daily.  01/31/14  Yes Doe-Hyun R Shawna Orleans, DO  escitalopram (LEXAPRO) 5 MG tablet Take 1 tablet (5 mg total) by mouth daily. 01/31/14  Yes Doe-Hyun Kyra Searles, DO  Polyethyl Glycol-Propyl Glycol (SYSTANE OP) Place 1 drop into both eyes 3 (three) times daily as needed (dry eyes).   Yes Historical Provider, MD    Family History  Family History  Problem Relation Age of Onset  . Coronary artery disease Father 4  . Brain cancer Sister     Social History  History   Social History  . Marital Status: Widowed    Spouse Name: N/A    Number of Children: 84  . Years of Education: college   Occupational History  .      Retired   Social History Main Topics  . Smoking status: Never Smoker   . Smokeless tobacco: Never Used  . Alcohol Use: No  . Drug Use: No  . Sexual Activity: Not on file   Other Topics Concern  . Not on file   Social History Narrative   Patient lives with daughter when she is in [place]   She is widowed. Husband passed away 13 yrs ago.   Retired   Veterinary surgeon.   Right handed.   Caffeine - None     Review of Systems General:  No chills, fever, night sweats.  She is concerned about the inability to gain weight. Cardiovascular:  No chest pain, dyspnea on exertion, edema, orthopnea,  paroxysmal nocturnal dyspnea.  Positive for palpitations Dermatological: No rash, lesions/masses Respiratory: No cough, dyspnea Urologic: No hematuria, dysuria Abdominal:   No nausea, vomiting, diarrhea, bright red blood per rectum, melena, or hematemesis Neurologic:  No visual changes, wkns, changes in mental status. All other systems reviewed and are otherwise negative except as noted above.  Physical Exam  Blood pressure 124/74, pulse 71, temperature 97.4 F (36.3 C), temperature source Oral, resp. rate 15, SpO2 97.00%.  General:  Pleasant, NAD Psych: Normal affect. Neuro: Alert and oriented X 3. Moves all extremities spontaneously. HEENT: Normal. The prior laceration over right eye is well healed.  Neck: Supple without bruits or JVD. Lungs:  Resp regular and unlabored, CTA. Heart: RRR no s3, s4, or murmurs. Abdomen: Soft, non-tender, non-distended, BS + x 4.  Extremities: No clubbing, cyanosis or edema. DP/PT/Radials 2+ and equal bilaterally.  Labs  Troponin [facility] of Care Test) No results found for this basename: TROPIPOC,  in the last 72 hours  Recent Labs  03/22/14 1130  TROPONINI <0.30   Lab Results  Component Value Date   WBC 4.9 03/22/2014   HGB 14.5 03/22/2014  HCT 41.2 03/22/2014   MCV 94.5 03/22/2014   PLT 192 03/22/2014     Recent Labs Lab 03/22/14 1130  NA 142  K 3.9  CL 101  CO2 25  BUN 10  CREATININE 0.69  CALCIUM 9.8  GLUCOSE 94   No results found for this basename: CHOL,  HDL,  LDLCALC,  TRIG   No results found for this basename: DDIMER     Radiology/Studies  Ct Head Wo Contrast  02/28/2014   CLINICAL DATA:  Near syncope, head injury after fall.  EXAM: CT HEAD WITHOUT CONTRAST  CT CERVICAL SPINE WITHOUT CONTRAST  TECHNIQUE: Multidetector CT imaging of the head and cervical spine was performed following the standard protocol without intravenous contrast. Multiplanar CT image reconstructions of the cervical spine were also generated.  COMPARISON:  None.  FINDINGS: CT HEAD FINDINGS  There appears to be small nondisplaced fracture involving the right orbital roof and posterior wall the right frontal sinus with associated hemorrhage. Right frontal scalp laceration is noted. Mild diffuse cortical atrophy is noted. No mass effect or midline shift is noted. Ventricular size is within normal limits. There is no evidence of mass lesion, intracranial hemorrhage or acute infarction.  CT CERVICAL SPINE FINDINGS  No fracture or significant spondylolisthesis is noted. No significant  degenerative disc disease is noted. Minimal degenerative changes are seen involving the posterior facet joints.  IMPRESSION: Right frontal scalp laceration is noted. There appears to be small nondisplaced fracture involving the anterior portion of the right orbital roof and posterior wall of the right frontal sinus with a small amount of hemorrhage present within the right frontal sinus. No acute intracranial abnormality is noted.  No acute abnormality seen in the cervical spine.   Electronically Signed   By: Sabino Dick M.D.   On: 02/28/2014 14:49   Ct Cervical Spine Wo Contrast  02/28/2014   CLINICAL DATA:  Near syncope, head injury after fall.  EXAM: CT HEAD WITHOUT CONTRAST  CT CERVICAL SPINE WITHOUT CONTRAST  TECHNIQUE: Multidetector CT imaging of the head and cervical spine was performed following the standard protocol without intravenous contrast. Multiplanar CT image reconstructions of the cervical spine were also generated.  COMPARISON:  None.  FINDINGS: CT HEAD FINDINGS  There appears to be small nondisplaced fracture involving the right orbital roof and posterior wall the right frontal sinus with associated hemorrhage. Right frontal scalp laceration is noted. Mild diffuse cortical atrophy is noted. No mass effect or midline shift is noted. Ventricular size is within normal limits. There is no evidence of mass lesion, intracranial hemorrhage or acute infarction.  CT CERVICAL SPINE FINDINGS  No fracture or significant spondylolisthesis is noted. No significant degenerative disc disease is noted. Minimal degenerative changes are seen involving the posterior facet joints.  IMPRESSION: Right frontal scalp laceration is noted. There appears to be small nondisplaced fracture involving the anterior portion of the right orbital roof and posterior wall of the right frontal sinus with a small amount of hemorrhage present within the right frontal sinus. No acute intracranial abnormality is noted.  No acute  abnormality seen in the cervical spine.   Electronically Signed   By: Sabino Dick M.D.   On: 02/28/2014 14:49    ECG  EKG on admission at 1018 shows atrial fibrillation with rapid ventricular response. Subsequent EKG done at 1122 shows normal sinus rhythm.  2-D echo done on 02/02/14: Study Conclusions  - Left ventricle: The cavity size was normal. Systolic function was normal. The estimated  ejection fraction was 55%. Wall motion was normal; there were no regional wall motion abnormalities. There was an increased relative contribution of atrial contraction to ventricular filling. Doppler parameters are consistent with abnormal left ventricular relaxation (grade 1 diastolic dysfunction). - Aorta: Aortic root dimension at sinus of Valsalva: 38 mm (ED). - Ascending aorta: The ascending aorta was mildly dilated. - Mitral valve: Mild, late systolicprolapse, involving the anterior leaflet and the posterior leaflet. There was moderate regurgitation. - Tricuspid valve: There was trivial regurgitation. - Pulmonic valve: There was trivial regurgitation. - Impressions: Compared to the study of 2009, the MR appears to have increased from mild to moderate to now moderate in severity.   ASSESSMENT AND PLAN  1.  Paroxysmal atrial fibrillation confirmed by EKG.  She has a Chadssvasc score of 6 (for hypertension, age greater than 53, prior TIA, and female sex). 2. past history of syncope while standing in line 3. mitral valve prolapse with  moderate mitral regurgitation by echo. 4. Hyperlipidemia 5. anxiety for which she takes Lexapro 6. Hypercholesterolemia 7. weight loss  Plan: Admit for observation.  Watch blood pressure carefully and observe for orthostatic hypotension while we add low-dose metoprolol to prevent further atrial fib. We will start her on Apixaban for her paroxysmal atrial fibrillation.  We will update her chest x-ray.  Anticipate home tomorrow if stable.  Her previous head  injury from 03/01/14 appears to be stable. Signed, Darlin Coco, MD  03/22/2014, 12:44 PM

## 2014-03-22 NOTE — Telephone Encounter (Signed)
Patient phoned in c/o heart rate of 130-140, stated it woke her up  Blood pressure 127/? Patient denies any chest pain or shortness of breath Recently in hospital with syncope and orthostatic hypotension, Metoprolol d/c Reviewed with Tera Helper NP and recommended going to Emergency Dept Left message to call back

## 2014-03-22 NOTE — ED Notes (Addendum)
Pt arrived by [facility] from home with daughter. Pt c/o generalize malaise and stated that she felt her heart race during the night and then this morning. EMS arrived and placed pt on monitor which showed Afib with a rate of 115-130. Denies any CP or SOB just feels tired. Pt has a pulse ox at home and noticed that her heart rate was high and called EMS. Daughter on scene stated that pt has had some anxiety this morning because she has a flight to [place] at 3p where other daughter lives. BP-128/68 Resp-18 O2sat-100%ra CBG-81. Also when asking pt if she has a hx of Afib she stated "sometimes".

## 2014-03-22 NOTE — ED Notes (Signed)
Myself and Robie Ridge, RN undressed pt, placed in gown, on monitor, continuous pulse oximetry and blood pressure cuff; warm blanket given

## 2014-03-22 NOTE — Discharge Instructions (Addendum)
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
## 2014-03-22 NOTE — Progress Notes (Signed)
ANTICOAGULATION CONSULT NOTE - Initial Consult  Pharmacy Consult for apixaban Indication: atrial fibrillation  Allergies  Allergen Reactions  . Biaxin [Clarithromycin] Other (See Comments)    Severe stomach burning  . Midodrine Other (See Comments)    Rash, headache, shakey  . Zocor [Simvastatin] Rash    Patient Measurements:    Vital Signs: Temp: 97.4 F (36.3 C) (10/21 1024) Temp Source: Oral (10/21 1024) BP: 124/65 mmHg (10/21 1330) Pulse Rate: 71 (10/21 1330)  Labs:  Recent Labs  03/22/14 1130  HGB 14.5  HCT 41.2  PLT 192  CREATININE 0.69  TROPONINI <0.30    The CrCl is unknown because both a height and weight (above a minimum accepted value) are required for this calculation.   Medical History: Past Medical History  Diagnosis Date  . HTN (hypertension)   . Osteoporosis   . TIA (transient ischemic attack)   . Mitral valve prolapse     a. Echo 01/2014: mild MVP.  Marland Kitchen Mitral regurgitation     a. Echo 01/2014: mod MR.  . Ascending aorta dilation     a. Echo 01/2014: mildly dilated.  . Abnormal TSH     a. Labs 01/2014:  low TSH 0.26 with free T4 1.07.  . Non-Hodgkin lymphoma     a. Tx with high dose chemotherapy at [facility] years ago. Pt reported  associated heart valve damage from chemotherapy.    Medications:  See med history  Assessment: 80 yof presented to the ED with afib. To start apixaban for anticoagulation. Baseline CBC is WNL. Scr is good at 0.69, age 80, weight 45.4kg so qualifies for 5mg  BID dosing.   Goal of Therapy:  Therapeutic anticoagulation   Plan:  1. Apixaban 5mg  PO BID 2. F/u renal fxn, S&S of bleeding  Wessie Shanks, Rande Lawman 03/22/2014,1:48 PM

## 2014-03-22 NOTE — Telephone Encounter (Signed)
Tried to call patient for 3rd time and saw that patient currently in ED

## 2014-03-22 NOTE — Telephone Encounter (Signed)
New message     Pt says her heart rate is 140.

## 2014-03-22 NOTE — Telephone Encounter (Signed)
Left message to call back  

## 2014-03-22 NOTE — ED Notes (Signed)
Pt resting, no needs at this time.

## 2014-03-23 ENCOUNTER — Other Ambulatory Visit: Payer: Self-pay

## 2014-03-23 ENCOUNTER — Encounter (HOSPITAL_COMMUNITY): Payer: Self-pay | Admitting: Physician Assistant

## 2014-03-23 DIAGNOSIS — I4891 Unspecified atrial fibrillation: Secondary | ICD-10-CM | POA: Diagnosis not present

## 2014-03-23 DIAGNOSIS — M810 Age-related osteoporosis without current pathological fracture: Secondary | ICD-10-CM | POA: Diagnosis not present

## 2014-03-23 DIAGNOSIS — I10 Essential (primary) hypertension: Secondary | ICD-10-CM | POA: Insufficient documentation

## 2014-03-23 DIAGNOSIS — R9389 Abnormal findings on diagnostic imaging of other specified body structures: Secondary | ICD-10-CM | POA: Insufficient documentation

## 2014-03-23 DIAGNOSIS — I472 Ventricular tachycardia: Secondary | ICD-10-CM

## 2014-03-23 DIAGNOSIS — I340 Nonrheumatic mitral (valve) insufficiency: Secondary | ICD-10-CM | POA: Diagnosis present

## 2014-03-23 DIAGNOSIS — I341 Nonrheumatic mitral (valve) prolapse: Secondary | ICD-10-CM | POA: Diagnosis not present

## 2014-03-23 DIAGNOSIS — I4729 Other ventricular tachycardia: Secondary | ICD-10-CM

## 2014-03-23 LAB — BASIC METABOLIC PANEL
Anion gap: 9 (ref 5–15)
BUN: 10 mg/dL (ref 6–23)
CO2: 28 mEq/L (ref 19–32)
Calcium: 9.1 mg/dL (ref 8.4–10.5)
Chloride: 99 mEq/L (ref 96–112)
Creatinine, Ser: 0.86 mg/dL (ref 0.50–1.10)
GFR calc Af Amer: 74 mL/min — ABNORMAL LOW (ref >90)
GFR, EST NON AFRICAN AMERICAN: 64 mL/min — AB (ref >90)
Glucose, Bld: 103 mg/dL — ABNORMAL HIGH (ref 70–99)
Potassium: 4.4 mEq/L (ref 3.7–5.3)
Sodium: 136 mEq/L — ABNORMAL LOW (ref 137–147)

## 2014-03-23 LAB — MAGNESIUM: Magnesium: 1.8 mg/dL (ref 1.5–2.5)

## 2014-03-23 MED ORDER — MAGNESIUM OXIDE 400 (241.3 MG) MG PO TABS
400.0000 mg | ORAL_TABLET | Freq: Every day | ORAL | Status: DC
  Administered 2014-03-24: 400 mg via ORAL
  Filled 2014-03-23: qty 1

## 2014-03-23 MED ORDER — ENSURE COMPLETE PO LIQD
237.0000 mL | Freq: Three times a day (TID) | ORAL | Status: DC
  Administered 2014-03-23 – 2014-03-24 (×3): 237 mL via ORAL

## 2014-03-23 MED ORDER — MAGNESIUM SULFATE IN D5W 10-5 MG/ML-% IV SOLN
1.0000 g | Freq: Once | INTRAVENOUS | Status: AC
  Administered 2014-03-23: 1 g via INTRAVENOUS
  Filled 2014-03-23: qty 100

## 2014-03-23 MED ORDER — MAGNESIUM OXIDE 400 (241.3 MG) MG PO TABS: 400.0000 mg | ORAL_TABLET | Freq: Every day | ORAL | Status: DC

## 2014-03-23 NOTE — Progress Notes (Signed)
Pt had 13 bt run of vtach at 1213.  Pt VSS and asymptomatic.  Dayna, Monona paged and waiting to hear back.  Will continue to closely monitor.

## 2014-03-23 NOTE — Progress Notes (Signed)
Patient: [name] / Admit Date: 03/22/2014 / Date of Encounter: 03/23/2014, 7:49 AM   Subjective: Feels tired but denies CP or SOB. Yesterday when heart was racing she had a funny feeling a brief thump of chest pain.  Will ask MD to review tele as her episode around 4am appeared to be PAT instead PAF.  Objective: Tele: This AM, NSR/sinus bradycardia, brief episode of nonsustained atrial tach after 4am, occasional multifocal PVCs, 5 beats NSVT Physical Exam: Blood pressure 105/61, pulse 59, temperature 97.8 F (36.6 C), temperature source Oral, resp. rate 16, SpO2 97.00%. General: Well developed thin WF in no acute distress. Head: Normocephalic, atraumatic, sclera non-icteric, no xanthomas, nares are without discharge. Neck: Negative for carotid bruits. JVP not elevated. Lungs: Clear bilaterally to auscultation without wheezes, rales, or rhonchi. Breathing is unlabored. Heart: RRR S1 S2 without murmurs, rubs, or gallops.  Abdomen: Soft, non-tender, non-distended with normoactive bowel sounds. No rebound/guarding. Extremities: No clubbing or cyanosis. No edema. Distal pedal pulses are 2+ and equal bilaterally. Neuro: Alert and oriented X 3. Moves all extremities spontaneously. Psych:  Responds to questions appropriately with a normal affect.  No intake or output data in the 24 hours ending 03/23/14 0749  Inpatient Medications:  . apixaban  5 mg Oral BID  . atorvastatin  10 mg Oral q1800  . escitalopram  5 mg Oral Daily  . metoprolol succinate  12.5 mg Oral Daily   Infusions:    Labs:  Recent Labs  03/22/14 1130  NA 142  141  K 3.9  3.9  CL 101  100  CO2 25  24  GLUCOSE 94  92  BUN 10  10  CREATININE 0.69  0.69  CALCIUM 9.8  9.7  MG 1.9  PHOS 3.5    Recent Labs  03/22/14 1130  AST 28  ALT 11  ALKPHOS 83  BILITOT 0.7  PROT 7.1  ALBUMIN 3.7    Recent Labs  03/22/14 1130  WBC 4.9  NEUTROABS 2.8  HGB 14.5  HCT 41.2  MCV 94.5  PLT 192     Recent Labs  03/22/14 1130  TROPONINI <0.30   No components found with this basename: POCBNP,  No results found for this basename: HGBA1C,  in the last 72 hours   Radiology/Studies:  X-ray Chest Pa And Lateral 03/22/2014   CLINICAL DATA:  Hypertension. History of mitral valve prolapse and regurgitation non intentional weight loss.  EXAM: CHEST  2 VIEW  COMPARISON:  06/07/2008  FINDINGS: Normal heart size. There is no pleural effusion or edema. The lungs are hyperinflated with coarsened interstitial markings noted bilaterally. Calcified granuloma is identified within the left upper lobe. Calcified breast implants are noted bilaterally.  IMPRESSION: 1. No active cardiopulmonary abnormalities. 2. Hyperinflation.   Electronically Signed   By: Kerby Moors M.D.   On: 03/22/2014 16:45   Ct Head Wo Contrast 02/28/2014   CLINICAL DATA:  Near syncope, head injury after fall.  EXAM: CT HEAD WITHOUT CONTRAST  CT CERVICAL SPINE WITHOUT CONTRAST  TECHNIQUE: Multidetector CT imaging of the head and cervical spine was performed following the standard protocol without intravenous contrast. Multiplanar CT image reconstructions of the cervical spine were also generated.  COMPARISON:  None.  FINDINGS: CT HEAD FINDINGS  There appears to be small nondisplaced fracture involving the right orbital roof and posterior wall the right frontal sinus with associated hemorrhage. Right frontal scalp laceration is noted. Mild diffuse cortical atrophy is noted. No mass effect or midline shift  is noted. Ventricular size is within normal limits. There is no evidence of mass lesion, intracranial hemorrhage or acute infarction.  CT CERVICAL SPINE FINDINGS  No fracture or significant spondylolisthesis is noted. No significant degenerative disc disease is noted. Minimal degenerative changes are seen involving the posterior facet joints.  IMPRESSION: Right frontal scalp laceration is noted. There appears to be small nondisplaced  fracture involving the anterior portion of the right orbital roof and posterior wall of the right frontal sinus with a small amount of hemorrhage present within the right frontal sinus. No acute intracranial abnormality is noted.  No acute abnormality seen in the cervical spine.   Electronically Signed   By: Sabino Dick M.D.   On: 02/28/2014 14:49     Assessment and Plan  1. Newly recognized PAF 2. Prior history of syncope/orthostasis with head injury 01/2014 3. Mild MVP with moderate MR by echo 02/02/14 - repeat echo due  08/2014 per Dr. Theodosia Blender office note 4. HLD, on atorvastatin 5. Anxiety 6. PVCs/NSVT 7. ?PAT on tele early this morning  Continue Toprol and apixaban which are new. Will ask care management to see to determine if any prior auth needs are necessary. If she continues to have symptomatic AF, would consider referral to EP for input on antiarrhythmic options given that orthostasis has historically been an issue for her. Will ask for MD input regarding nuclear stress testing with regard to NSVT. Would also appreciate MD review of tele given that this AM's episode brief appeared PAT and not PAF. P waves were more clearly defined than yesterday's tele which appear closer to AF.   She is not sure if she is ready to go home since she just woke up but will await input as the morning goes on.  Signed, Melina Copa PA-C  History and all data above reviewed.  Patient examined.  I agree with the findings as above. Tele with atrial fib and PAF.   The patient exam reveals COR:RRR  ,  Lungs: Clear  ,  Abd: .Positive bowel sounds, no rebound no guarding, Ext No edema  .  All available labs, radiology testing, previous records reviewed. Agree with documented assessment and plan. Atrial fib:  Rate control might be difficult.  We need symptomatic control so short fast bursts might be OK if no significant symptoms.  I will not push the beta blocker at this point.  Keep in hospital and ambulate on tele  tonight.  Probably home in the AM.  Check orthostatic BPs before going home.   I am not planning a stress test at this point.    Jeneen Rinks Lehman Whiteley  12:47 PM  03/23/2014

## 2014-03-23 NOTE — Progress Notes (Signed)
Md on call paged that pt had 5 beats of Vtach and was asymptomatic bp 105/61 hr 67. No new orders. Arthor Captain LPN

## 2014-03-23 NOTE — Progress Notes (Signed)
INITIAL NUTRITION ASSESSMENT  DOCUMENTATION CODES Per approved criteria  -Severe malnutrition in the context of chronic illness   Pt meets criteria for severe MALNUTRITION in the context of chronic illness as evidenced by severe depletion of muscle and subcutaneous fat mass.  INTERVENTION: Ensure Complete PO TID, each supplement provides 350 kcal and 13 grams of protein  NUTRITION DIAGNOSIS: Malnutrition related to inadequate oral intake as evidenced by severe depletion of muscle and subcutaneous fat mass.   Goal: Intake to meet >90% of estimated nutrition needs.  Monitor:  PO intake, labs, weight trend.  Reason for Assessment: MST  80 y.o. female  Admitting Dx: A. Fib.  ASSESSMENT: 80 year old woman admitted with rapid palpitations and found to be in new atrial fibrillation. She had a past history of orthostatic hypotension and syncope and was hospitalized here from 03/01/14 until 03/04/14 for syncope and head injury.  Patient reports that she continues to lose weight. For the past 8-10 months she has lost ~1 lb per month, last doctor visit, had lost 2 lbs in one week. She says that she has studied nutrition and eats a very healthy diet, takes 1 Tbsp of flaxseed and 1 Tbsp of fish oil every day. Does not eat a lot of fried or fatty foods. She drinks supplements at home 2-3 times per day.  Nutrition Focused Physical Exam:  Subcutaneous Fat:  Orbital Region: moderate depletion Upper Arm Region: moderate depletion Thoracic and Lumbar Region: severe depletion  Muscle:  Temple Region: severe depletion Clavicle Bone Region: severe depletion Clavicle and Acromion Bone Region: severe depletion Scapular Bone Region: severe depletion Dorsal Hand: severe depletion Patellar Region: severe depletion Anterior Thigh Region: severe depletion Posterior Calf Region: severe depletion  Edema: none   Height: Ht Readings from Last 1 Encounters:  03/23/14 5\' 6"  (1.676 m)     Weight: Wt Readings from Last 1 Encounters:  03/23/14 100 lb (45.36 kg)    Ideal Body Weight: 59.1 kg  % Ideal Body Weight: 77%  Wt Readings from Last 10 Encounters:  03/23/14 100 lb (45.36 kg)  03/21/14 100 lb (45.36 kg)  03/15/14 104 lb (47.174 kg)  03/13/14 102 lb (46.267 kg)  03/04/14 101 lb 14.4 oz (46.222 kg)  02/01/14 102 lb 1.9 oz (46.321 kg)  01/31/14 101 lb (45.813 kg)  12/14/13 104 lb (47.174 kg)  12/01/13 103 lb (46.72 kg)    Usual Body Weight: 110 lb per patient  % Usual Body Weight: 91%  BMI:  Body mass index is 16.15 kg/(m^2). Underweight  Estimated Nutritional Needs: Kcal: 1350-1550 Protein: 75-85 gm Fluid: 1.5 L  Skin: right eye laceration  Diet Order: Cardiac  EDUCATION NEEDS: -Education needs addressed   Intake/Output Summary (Last 24 hours) at 03/23/14 1111 Last data filed at 03/23/14 0859  Gross per 24 hour  Intake    120 ml  Output      0 ml  Net    120 ml    Last BM: 10/21   Labs:   Recent Labs Lab 03/22/14 1130  NA 142  141  K 3.9  3.9  CL 101  100  CO2 25  24  BUN 10  10  CREATININE 0.69  0.69  CALCIUM 9.8  9.7  MG 1.9  PHOS 3.5  GLUCOSE 94  92    CBG (last 3)  No results found for this basename: GLUCAP,  in the last 72 hours  Scheduled Meds: . apixaban  5 mg Oral BID  . atorvastatin  10 mg Oral q1800  . escitalopram  5 mg Oral Daily  . metoprolol succinate  12.5 mg Oral Daily    Continuous Infusions:   Past Medical History  Diagnosis Date  . HTN (hypertension)   . Osteoporosis   . TIA (transient ischemic attack)   . Mitral valve prolapse     a. Echo 01/2014: mild MVP.  Marland Kitchen Mitral regurgitation     a. Echo 01/2014: mod MR.  . Ascending aorta dilation     a. Echo 01/2014: mildly dilated.  . Abnormal TSH     a. Labs 01/2014:  low TSH 0.26 with free T4 1.07.  . Non-Hodgkin lymphoma     a. Tx with high dose chemotherapy at [facility] years ago. Pt reported  associated heart valve damage from  chemotherapy.  Marland Kitchen Dysrhythmia     ATRIAL FIBRILATION  . Abnormal ultrasound of carotid artery     a. 1-39% BICA 01/2014.    Past Surgical History  Procedure Laterality Date  . Cataract extraction    . Oral tooth      Molli Barrows, RD, LDN, [place] Pager [phone] After Hours Pager [phone]

## 2014-03-23 NOTE — Progress Notes (Signed)
Ambulated pt in hallway and tolerated well.  O2 sats 100%

## 2014-03-23 NOTE — Progress Notes (Addendum)
Pt daughter Lauryl is requesting the cardiologist to call her and update her on her mothers condition.  Best number to reach her is [phone]   -----  Addendum: I spoke with Lauryl (with patient's permission) and gave her an update. She also inquired if there is anything I could recommend for her mom's weight loss. Thyroid function recheck was recently normal. I will get a dietician consult. I asked her to please f/u with PCP for further evaluation. Her daughter Dorcas Carrow is currently in [place] and her other daughter lives here in [place]. The patient is from [place] and her regular doctor and cardiologist are there. Lauryl had just gotten the OK from their PCP for the patient to fly back there escorted with assistance when she ended up back in the hospital this admission. Upon discharge we will need to come to a consensus when we think she'll be OK to fly back.   Dayna Dunn PA-C

## 2014-03-23 NOTE — Progress Notes (Signed)
UR completed 

## 2014-03-23 NOTE — Progress Notes (Addendum)
Mg 1.8 today, 1.9 yesterday. Has had brief NSVT this admission. Will give 1 g mag sulfate today and start MagOx 400mg  daily tomorrow. Hilmer Aliberti PA-C

## 2014-03-23 NOTE — Care Management Note (Addendum)
    Page 1 of 1   03/23/2014     3:38:59 PM CARE MANAGEMENT NOTE 03/23/2014  Patient:  [name], [name]   Account Number:  [id number]  Date Initiated:  03/23/2014  Documentation initiated by:  GRAVES-BIGELOW,Alianys Chacko  Subjective/Objective Assessment:   Pt admitted for rapid palpitations and found to be in new atrial fibrillation.     Action/Plan:   Benefits check in process for eliquis. Will make pt aware of cost once completed.   Anticipated DC Date:  03/25/2014   Anticipated DC Plan:  [place]  CM consult  Medication Assistance      Choice offered to / List presented to:             Status of service:  In process, will continue to follow Medicare Important Message given?   (If response is "NO", the following Medicare IM given date fields will be blank) Date Medicare IM given:   Medicare IM given by:   Date Additional Medicare IM given:   Additional Medicare IM given by:    Discharge Disposition:    Per UR Regulation:  Reviewed for med. necessity/level of care/duration of stay  If discussed at Long Length of Stay Meetings, dates discussed:    Comments:  APIXABAN- NOT ON FORMULARY  ELIQIUIS 5 MG BIG COVER-YES CO-PAY $ 337.69 ** PATIENT PAY A 100 % OF COAST ** PRIOR APPROVAL -NO PHARMACY : [facility]  FOR MAIL ORDER THE PATIENT PAY $30.00   03-23-14 1538 Eliquis card given to pt for 30 day free supply. Jacqlyn Krauss, RN,BSN [phone]

## 2014-03-24 ENCOUNTER — Telehealth: Payer: Self-pay | Admitting: Physician Assistant

## 2014-03-24 ENCOUNTER — Encounter (HOSPITAL_COMMUNITY): Payer: Self-pay | Admitting: Physician Assistant

## 2014-03-24 DIAGNOSIS — E43 Unspecified severe protein-calorie malnutrition: Secondary | ICD-10-CM | POA: Insufficient documentation

## 2014-03-24 DIAGNOSIS — I4891 Unspecified atrial fibrillation: Secondary | ICD-10-CM | POA: Diagnosis not present

## 2014-03-24 DIAGNOSIS — I4719 Other supraventricular tachycardia: Secondary | ICD-10-CM | POA: Diagnosis present

## 2014-03-24 DIAGNOSIS — I493 Ventricular premature depolarization: Secondary | ICD-10-CM | POA: Diagnosis present

## 2014-03-24 DIAGNOSIS — I471 Supraventricular tachycardia: Secondary | ICD-10-CM | POA: Diagnosis present

## 2014-03-24 LAB — MAGNESIUM: Magnesium: 2 mg/dL (ref 1.5–2.5)

## 2014-03-24 MED ORDER — METOPROLOL SUCCINATE ER 25 MG PO TB24: 12.5000 mg | ORAL_TABLET | Freq: Every day | ORAL | Status: DC

## 2014-03-24 MED ORDER — APIXABAN 5 MG PO TABS: 5.0000 mg | ORAL_TABLET | Freq: Two times a day (BID) | ORAL | Status: DC

## 2014-03-24 MED ORDER — MAGNESIUM OXIDE 400 (241.3 MG) MG PO TABS: 400.0000 mg | ORAL_TABLET | Freq: Every day | ORAL | Status: DC

## 2014-03-24 NOTE — Progress Notes (Signed)
Pt discharged home with family.  Reviewed discharge instructions and education, all questions answered.  Assessment unchanged from earlier.  

## 2014-03-24 NOTE — Progress Notes (Signed)
Patient: [name] / Admit Date: 03/22/2014 / Date of Encounter: 03/24/2014, 7:50 AM   Subjective: Feels much better this AM. Ambulated yesterday without problem. Orthostatics pending this AM. She says she has been so impressed with the staff through her entire stay here at [facility] and has requested to speak to the 3w director to let her know what a wonderful experience she has had.   Objective: Telemetry: predominantly NSR, occasional PVCs.  Yesterday afternoon 14 beats NSVT, brief PAT yesterday around 2pm, two episodes of sinus tach with PACs but no recurrence of AF. After Mg administration, no significant arrhythmias. Physical Exam: Blood pressure 107/64, pulse 62, temperature 97.8 F (36.6 C), temperature source Oral, resp. rate 19, height 5\' 6"  (1.676 m), weight 100 lb (45.36 kg), SpO2 97.00%. General: Well developed thin WF in no acute distress.  Head: Normocephalic, atraumatic, sclera non-icteric, no xanthomas, nares are without discharge.  Neck: Negative for carotid bruits. JVP not elevated.  Lungs: Clear bilaterally to auscultation without wheezes, rales, or rhonchi. Breathing is unlabored.  Heart: RRR S1 S2 without murmurs, rubs, or gallops.  Abdomen: Soft, non-tender, non-distended with normoactive bowel sounds. No rebound/guarding.  Extremities: No clubbing or cyanosis. No edema. Distal pedal pulses are 2+ and equal bilaterally.  Neuro: Alert and oriented X 3. Moves all extremities spontaneously.  Psych: Responds to questions appropriately with a normal affect.    Intake/Output Summary (Last 24 hours) at 03/24/14 0750 Last data filed at 03/23/14 0859  Gross per 24 hour  Intake    120 ml  Output      0 ml  Net    120 ml    Inpatient Medications:  . apixaban  5 mg Oral BID  . atorvastatin  10 mg Oral q1800  . escitalopram  5 mg Oral Daily  . feeding supplement (ENSURE COMPLETE)  237 mL Oral TID BM  . magnesium oxide  400 mg Oral Daily  . metoprolol succinate  12.5  mg Oral Daily   Infusions:    Labs:  Recent Labs  03/22/14 1130 03/23/14 1445 03/24/14 0424  NA 142  141 136*  --   K 3.9  3.9 4.4  --   CL 101  100 99  --   CO2 25  24 28   --   GLUCOSE 94  92 103*  --   BUN 10  10 10   --   CREATININE 0.69  0.69 0.86  --   CALCIUM 9.8  9.7 9.1  --   MG 1.9 1.8 2.0  PHOS 3.5  --   --     Recent Labs  03/22/14 1130  AST 28  ALT 11  ALKPHOS 83  BILITOT 0.7  PROT 7.1  ALBUMIN 3.7    Recent Labs  03/22/14 1130  WBC 4.9  NEUTROABS 2.8  HGB 14.5  HCT 41.2  MCV 94.5  PLT 192    Recent Labs  03/22/14 1130  TROPONINI <0.30   Radiology/Studies:  X-ray Chest Pa And Lateral 03/22/2014   CLINICAL DATA:  Hypertension. History of mitral valve prolapse and regurgitation non intentional weight loss.  EXAM: CHEST  2 VIEW  COMPARISON:  06/07/2008  FINDINGS: Normal heart size. There is no pleural effusion or edema. The lungs are hyperinflated with coarsened interstitial markings noted bilaterally. Calcified granuloma is identified within the left upper lobe. Calcified breast implants are noted bilaterally.  IMPRESSION: 1. No active cardiopulmonary abnormalities. 2. Hyperinflation.   Electronically Signed   By: Kerby Moors  M.D.   On: 03/22/2014 16:45    Assessment and Plan  1. Newly recognized PAF  2. Prior history of syncope/orthostasis with head injury 01/2014  3. Mild MVP with moderate MR by echo 02/02/14 - repeat echo due 08/2014 per Dr. Theodosia Blender office note  4. HLD, on atorvastatin  5. Anxiety  6. PVCs/NSVT -> Magnesium repleted 7. ?PAT on tele early this morning 8. Severe protein calorie malnutrition  Continue low dose Toprol and Toprol. Per care management, Apixaban is $$$ at regular pharmacy but $30/mo at mail order. Will ask nurse to clarify with care manager which mail order to send script to. Will give separate 30 day free RX at discharge to go with card. If she continues to have symptomatic AF, would consider referral to  EP for input on antiarrhythmic options given that orthostasis has historically been an issue for her. Check orthostatics this AM. Will ask MD input on when she is OK to fly back to [place] where her family resides. She was originally cleared to fly back prior to this hospitalization. She follows with a cardiologist in [place].  I have let 3w know that she would like to speak with the director about her great experience at [facility] and they will be connecting her with Tammy.  Signed, Melina Copa PA-C   History and all data above reviewed.  Patient examined.  I agree with the findings as above.  The patient exam reveals COR:RRR  ,  Lungs: Clear  ,  Abd: Positive bowel sounds, no rebound no guarding, Ext No edema  .  All available labs, radiology testing, previous records reviewed. Agree with documented assessment and plan. OK to discharge.  We discussed her severe orthostasis.  She can increase salt.  She needs to wear her compression stockings all of the time.  She needs to be aware and avoid prolonged standing.  We discussed the need to not stand up quickly.  She needs to be able to sit/lie down immediately if she gets dizziness.  Needs TOC appt next week.      Minus Breeding  8:53 AM  03/24/2014

## 2014-03-24 NOTE — Progress Notes (Signed)
Orthostatic VS  Lying BP 127/53 HR 65  Sitting BP 119/61 HR 72  Standing Immediatly  BP 86/54 HR 87  Standing 3 minutes BP 96/69 HR 109

## 2014-03-24 NOTE — Telephone Encounter (Signed)
New message     TCM appointment on 03-31-14 with Dayna---per Dayna.

## 2014-03-24 NOTE — Progress Notes (Signed)
Discharge Summary   Patient ID: [name] MRN: [id number], DOB/AGE: 78-Sep-1935 78 y.o. Admit date: 03/22/2014 D/C date:     03/24/2014  Primary Care Provider: Drema Pry, DO Primary Cardiologist: Radford Pax  Primary Discharge Diagnoses:  1. Newly recognized PAF  2. HIstory of syncope due to orthostatic hypotension with head injury 01/2014  3. Continued orthostatic hypotension 4. Mild MVP with moderate MR by echo 02/02/14 - repeat echo due 08/2014 per Dr. Theodosia Blender office note  5. HLD, on atorvastatin  6. Anxiety  7. PVCs/NSVT in setting of mild hypomagnesemia (1.8) 8. Possible PAT on tele during this admission 9. Severe protein calorie malnutrition  Secondary Discharge Diagnoses:  1. Hx of TIA 2. Ascending aorta dilation - Echo 01/2014: mildly dilated 3. Abnormal TSH 4. Non-Hodgkin lymphoma 5. Abnormal ultrasound of carotid artery 1-39% [facility] 01/2014  Hospital Course: [name] is a 80 y/o F with history of remote history of non-Hodgkin's gastric lymphoma, TIA at the time of her chemotherapy in [place], HLD, orthostasis, anxiety, and prior syncope who was admitted with newly recognized atrial fibrillation with RVR. She reportedly had a cath 4 years ago in [place] and was told that she does not have any blockages. She splits her time from living in [place] and [place]. She grew up in the [place] but she has grandchildren that live in [place]. She had seen Dr. Radford Pax to establish care in September at which time she was reporting feelings of near-syncope and elevated HR. 2D echo 02/02/14: EF 12%, grade 1 diastolic dysfunction, mildly dilated aorta, mild late systolic MVP, moderate MR, trivial TR/PR. In mid-September 2015, she had an episode of syncope while standing in line. She was wearing an event monitor at the time and event monitor interrogation which did not show any arrhythmias. She was found to be orthostatic during that admission and metoprolol was stopped. As a result of  her syncopal episode on 03/01/14 she suffered a laceration above her right eyebrow and had a nondisplaced fracture of the superior aspect of the right orbit. Dr.Teoh is her ENT physician. The patient was discharged home on Midodrine for orthostatic hypotension and her metoprolol was stopped. However the patient did not tolerate midodrine and stopped it. Recently she has been on no blood pressure medication. She never started the Florinef that was discussed at discharge. She saw her PCP the day prior to admission and blood pressure was normal at 112/78. She had been cleared to fly back to [place] to stay with her other daughter. She states that she was somewhat caught off guard and went home to try to get ready for the trip in a hurry.   Later that evening her blood pressure felt high to her and she checked it and it was 248 systolic at home. She awoke around midnight with tachypalpitations. She was able to go back to sleep but symptoms persisted so she came to the ER 03/22/2014 where she was felt to be in atrial fibrillation with RVR. Prior to receiving the ordered dose of metoprolol, she went back into NSR on her own. She had not been experiencing any recent chest pain, recurrent dizzy spells or syncope. She has had unexplained weight loss however over the last 6 months. Thyroid function testing previously showed abnormal TSH but more recent studies in September showed normal TSH and free T4. CXR was nonacute. Troponin was negative. She was placed on very low dose Toprol XL 12.35m daily as well as Eliquis 551mBID. She had very brief  recurrence of AF the day she started the metoprolol, but no further past that. She did have two brief runs of what appeared to be PAT during this admission, as well as brief 5-14 beats of NSVT. Mg was 1.8 - this was repleted with IV magnesium and she was started on MagOx. She has not had any further significant arrhythmias since yesterday early afternoon. This morning orthostatics  were checked - Lying BP 127/53 HR 65; Sitting BP 119/61 HR 72; Standing Immediately BP 86/54 HR 87, Standing 3 minutes BP 96/69 HR 109. Compression stockings as well as abdominal binder were discussed with the patient. Otherwise she feels well today. Dr. Percival Spanish has seen and examined the patient today and feels she is stable for discharge. She was instructed to f/u PCP for her weight loss. Dietician felt she met criteria for severe malnutrition in the context of chronic illness as evidenced by severe depletion of muscle and subcutaneous fat mass. She was instructed to use Ensure Complete PO TID. She will have a TOC visit before getting cleared again once more to fly to [place].   Her apixaban will be $30/mo through mail order - sent in 90 day supply. I also gave her 30-day handwritten rx.  I gave her a handwritten rx for Mag-Ox for 30 days and sent in 90 day rx to mail order. I only gave her a handwritten Toprol XL script for now - I didn't want to send a 90-day supply just yet in case she does not tolerate this.  Discharge Vitals: Blood pressure 107/64, pulse 62, temperature 97.8 F (36.6 C), temperature source Oral, resp. rate 19, height _0  (1.676 m), weight 100 lb (45.36 kg), SpO2 97.00%.  Labs: Lab Results  Component Value Date   WBC 4.9 03/22/2014   HGB 14.5 03/22/2014   HCT 41.2 03/22/2014   MCV 94.5 03/22/2014   PLT 192 03/22/2014     Recent Labs Lab 03/22/14 1130 03/23/14 1445  NA 142  141 136*  K 3.9  3.9 4.4  CL 101  100 99  CO2 _1 BUN _2 CREATININE 0.69  0.69 0.86  CALCIUM 9.8  9.7 9.1  PROT 7.1  --   BILITOT 0.7  --   ALKPHOS 83  --   ALT 11  --   AST 28  --   GLUCOSE 94  92 103*    Recent Labs  03/22/14 1130  TROPONINI <0.30    Diagnostic Studies/Procedures   X-ray Chest Pa And Lateral 03/22/2014   CLINICAL DATA:  Hypertension. History of mitral valve prolapse and regurgitation non intentional weight loss.  EXAM: CHEST  2 VIEW   COMPARISON:  06/07/2008  FINDINGS: Normal heart size. There is no pleural effusion or edema. The lungs are hyperinflated with coarsened interstitial markings noted bilaterally. Calcified granuloma is identified within the left upper lobe. Calcified breast implants are noted bilaterally.  IMPRESSION: 1. No active cardiopulmonary abnormalities. 2. Hyperinflation.   Electronically Signed   By: Kerby Moors M.D.   On: 03/22/2014 16:45    Discharge Medications   Current Discharge Medication List    START taking these medications   Details  apixaban (ELIQUIS) 5 MG TABS tablet Take 1 tablet (5 mg total) by mouth 2 (two) times daily. Qty: 180 tablet, Refills: 1    magnesium oxide (MAG-OX) 400 (241.3 MG) MG tablet Take 1 tablet (400 mg total) by mouth daily. Qty: 90 tablet, Refills: 1  metoprolol succinate (TOPROL XL) 25 MG 24 hr tablet Take 0.5 tablets (12.5 mg total) by mouth daily.      CONTINUE these medications which have NOT CHANGED   Details  atorvastatin (LIPITOR) 10 MG tablet Take 10 mg by mouth daily.     escitalopram (LEXAPRO) 5 MG tablet Take 1 tablet (5 mg total) by mouth daily.    Polyethyl Glycol-Propyl Glycol (SYSTANE OP) Place 1 drop into both eyes 3 (three) times daily as needed (dry eyes).        Disposition   The patient will be discharged in stable condition to home. Discharge Instructions   Increase activity slowly    Complete by:  As directed   Please wear compression stockings. Stand up slowly and make sure to hold onto something when standing up to steady yourself.  If symptoms of dizziness upon standing persist, please try the abdominal binder we talked about. The dietician recommends you drink Ensure Complete three times a day. Dr. Percival Spanish encourages a high salt diet. Please keep adequately hydrated.   We have given you a handwritten prescription for Eliquis, Toprol, and MagOx. We have sent in a 90-day prescription to mail order for Eliquis and MagOx.  If you are tolerating the Toprol well at your follow-up appointment, we will send this into the mail order as well.          Follow-up Information   Follow up with Melina Copa, PA-C. ([facility] - 03/31/14 at 9:30am. At your follow-up visit, we will discuss timing of flying back to [facility].)    Specialty:  Cardiology   Contact information:   [address] [place] [address] [phone]       Follow up with Drema Pry, DO. (Please follow up with your primary doctor to discuss your weight loss.)    Specialty:  Internal Medicine   Contact information:   [address] [address] [phone]         Duration of Discharge Encounter: Greater than 30 minutes including physician and PA time.  Signed, Melina Copa PA-C 03/24/2014, 10:53 AM

## 2014-03-25 DIAGNOSIS — R42 Dizziness and giddiness: Secondary | ICD-10-CM | POA: Diagnosis not present

## 2014-03-25 DIAGNOSIS — S028XXD Fractures of other specified skull and facial bones, subsequent encounter for fracture with routine healing: Secondary | ICD-10-CM | POA: Diagnosis not present

## 2014-03-25 DIAGNOSIS — F329 Major depressive disorder, single episode, unspecified: Secondary | ICD-10-CM | POA: Diagnosis not present

## 2014-03-25 DIAGNOSIS — I10 Essential (primary) hypertension: Secondary | ICD-10-CM | POA: Diagnosis not present

## 2014-03-27 ENCOUNTER — Telehealth: Payer: Self-pay | Admitting: Cardiology

## 2014-03-27 NOTE — Telephone Encounter (Signed)
New Message  Pt called states that she was placed on Eliquis. Each day her headaches are getting worse.  She has never had headaches.. They only come when she takes the medication and she cannot deal with this side effect

## 2014-03-27 NOTE — Telephone Encounter (Signed)
Patient contacted regarding discharge from  [facility] on March 24, 2014.  Patient understands to follow up with provider --Melina Copa, PA on March 31, 2014 at 9:30 at [facility]. Patient understands discharge instructions? yes Patient understands medications and regiment? Yes  Patient understands to bring all medications to this visit? Yes

## 2014-03-27 NOTE — Telephone Encounter (Signed)
Patient reports she is taking her Eliquis 5mg , 2 tablets in am. Has been getting debilitating headaches from first day. Also reports fatigue and weakness. These symptoms are gone by the end of the day. Wants to stop it but I advised her not to until Dr. Radford Pax advised what to do.

## 2014-03-28 ENCOUNTER — Other Ambulatory Visit: Payer: Self-pay

## 2014-03-28 DIAGNOSIS — R51 Headache: Principal | ICD-10-CM

## 2014-03-28 DIAGNOSIS — R519 Headache, unspecified: Secondary | ICD-10-CM

## 2014-03-28 MED ORDER — RIVAROXABAN 15 MG PO TABS: 15.0000 mg | ORAL_TABLET | Freq: Every day | ORAL | Status: DC

## 2014-03-28 NOTE — Telephone Encounter (Signed)
Left message to call back  

## 2014-03-28 NOTE — Telephone Encounter (Signed)
Follow up ° ° ° ° ° °Returning Katy's call °

## 2014-03-28 NOTE — Telephone Encounter (Signed)
New Message  Pt called states that she is having a side effect from Elliquis/// She reports it makes her Dizzy and unsteady.. And Major Headaches// please call to discuss!!!

## 2014-03-28 NOTE — Telephone Encounter (Signed)
Please set patient up for head CT without contrast today given HA on anticoagulation - I would like her to have it today.  If scan ok then I am fine with changing to Xarelto

## 2014-03-28 NOTE — Telephone Encounter (Signed)
Does eliquis have the same incidence of HA as Xarelto?

## 2014-03-28 NOTE — Telephone Encounter (Signed)
There is no incidence rate of HA reported with Eliquis or Xarelto.  Would be appropriate to try to switch.  CrCl- 39 mL/min.  Would need 15mg  daily with food.

## 2014-03-28 NOTE — Telephone Encounter (Signed)
Patient C/O headache so bad she cannot sleep. She states that Tylenol has not helped.  Instructed patient to take new medication, Xarelto 15 mg, daily with food, and to stop taking Eliquis if she is intolerant.  New Rx sent in to [facility].

## 2014-03-28 NOTE — Telephone Encounter (Signed)
CT head without contrast ordered and sent to [facility] for scheduling ASAP. Notified patient of new orders and that she will receive a time for test.  Patient agrees with treatment plan.

## 2014-03-28 NOTE — Telephone Encounter (Signed)
Follow up      Pt thinks she is allergic to eliquis----causing headaches

## 2014-03-29 ENCOUNTER — Ambulatory Visit (INDEPENDENT_AMBULATORY_CARE_PROVIDER_SITE_OTHER)
Admission: RE | Admit: 2014-03-29 | Discharge: 2014-03-29 | Disposition: A | Discharge status: Home / Self Care | Payer: Medicare Other | Source: Ambulatory Visit | Attending: Cardiology | Admitting: Cardiology

## 2014-03-29 DIAGNOSIS — R51 Headache: Secondary | ICD-10-CM

## 2014-03-29 DIAGNOSIS — S028XXA Fractures of other specified skull and facial bones, initial encounter for closed fracture: Secondary | ICD-10-CM | POA: Diagnosis not present

## 2014-03-29 DIAGNOSIS — R519 Headache, unspecified: Secondary | ICD-10-CM

## 2014-03-30 DIAGNOSIS — I10 Essential (primary) hypertension: Secondary | ICD-10-CM | POA: Diagnosis not present

## 2014-03-30 DIAGNOSIS — R42 Dizziness and giddiness: Secondary | ICD-10-CM | POA: Diagnosis not present

## 2014-03-30 DIAGNOSIS — S028XXD Fractures of other specified skull and facial bones, subsequent encounter for fracture with routine healing: Secondary | ICD-10-CM | POA: Diagnosis not present

## 2014-03-30 DIAGNOSIS — F329 Major depressive disorder, single episode, unspecified: Secondary | ICD-10-CM | POA: Diagnosis not present

## 2014-03-31 ENCOUNTER — Encounter: Payer: Self-pay | Admitting: Physician Assistant

## 2014-03-31 ENCOUNTER — Encounter: Payer: Medicare Other | Admitting: Physician Assistant

## 2014-03-31 ENCOUNTER — Ambulatory Visit (INDEPENDENT_AMBULATORY_CARE_PROVIDER_SITE_OTHER): Payer: Medicare Other | Admitting: Physician Assistant

## 2014-03-31 VITALS — BP 97/64 | HR 73 | Ht 66.0 in | Wt 101.1 lb

## 2014-03-31 DIAGNOSIS — S028XXD Fractures of other specified skull and facial bones, subsequent encounter for fracture with routine healing: Secondary | ICD-10-CM | POA: Diagnosis not present

## 2014-03-31 DIAGNOSIS — I341 Nonrheumatic mitral (valve) prolapse: Secondary | ICD-10-CM

## 2014-03-31 DIAGNOSIS — I4729 Other ventricular tachycardia: Secondary | ICD-10-CM

## 2014-03-31 DIAGNOSIS — I480 Paroxysmal atrial fibrillation: Secondary | ICD-10-CM | POA: Diagnosis not present

## 2014-03-31 DIAGNOSIS — R519 Headache, unspecified: Secondary | ICD-10-CM

## 2014-03-31 DIAGNOSIS — R51 Headache: Secondary | ICD-10-CM

## 2014-03-31 DIAGNOSIS — R42 Dizziness and giddiness: Secondary | ICD-10-CM | POA: Diagnosis not present

## 2014-03-31 DIAGNOSIS — I471 Supraventricular tachycardia: Secondary | ICD-10-CM

## 2014-03-31 DIAGNOSIS — F329 Major depressive disorder, single episode, unspecified: Secondary | ICD-10-CM | POA: Diagnosis not present

## 2014-03-31 DIAGNOSIS — I472 Ventricular tachycardia: Secondary | ICD-10-CM | POA: Diagnosis not present

## 2014-03-31 DIAGNOSIS — I340 Nonrheumatic mitral (valve) insufficiency: Secondary | ICD-10-CM

## 2014-03-31 DIAGNOSIS — I10 Essential (primary) hypertension: Secondary | ICD-10-CM | POA: Diagnosis not present

## 2014-03-31 DIAGNOSIS — I951 Orthostatic hypotension: Secondary | ICD-10-CM | POA: Diagnosis not present

## 2014-03-31 NOTE — Discharge Summary (Signed)
Please see Progress Note from 03/24/14 by myself. This serves as the patient's discharge summary - I had accidentally selected it as a Progress Note. Dayna Dunn PA-C

## 2014-03-31 NOTE — Patient Instructions (Addendum)
Your physician has recommended you make the following change in your medication:   STOP ASPIRIN  START XERALTO AS INSTRUCTED   Your physician recommends that you schedule a follow-up appointment in:  IN 4 TO 5 WEEKS  WITH DR TURNER    IF YOU ARE STILL FEELING WELL IN ONE WEEK YOU ARE CLEARED TO FLY TO [place]

## 2014-03-31 NOTE — Progress Notes (Addendum)
[address], [address], [place]  [address] Phone: [phone] Fax:  [phone]  Date:  03/31/2014   Patient ID:  [name], DOB 12-Sep-1933, MRN [id number]   PCP:  Drema Pry, DO  Cardiologist:  Dr. Radford Pax  History of Present Illness: Para Cossey is a 80 y.o. female with history of remote history of non-Hodgkin's gastric lymphoma, TIA at the time of her chemotherapy in [place], [place], orthostatic hypotension, anxiety, prior syncope, and recently diagnosed PAF who presents for hospital follow-up.   She reportedly had a cath 4 years ago in [place] and was told that she does not have any blockages. She splits her time from living in [place] and [place]. She grew up in the [place] but she has grandchildren that live in [place]. She had seen Dr. Radford Pax to establish care in September at which time she was reporting feelings of near-syncope and elevated HR. 2D echo 02/02/14: EF 38%, grade 1 diastolic dysfunction, mildly dilated aorta, mild late systolic MVP, moderate MR, trivial TR/PR (f/u recommended 08/2014). In mid-September 2015, she had an episode of syncope while standing in line. She was wearing an event monitor at the time and event monitor interrogation which did not show any arrhythmias. She was found to be orthostatic during that admission and metoprolol was stopped. As a result of her syncopal episode on 03/01/14 she suffered a laceration above her right eyebrow and had a nondisplaced fracture of the superior aspect of the right orbit. Dr.Teoh is her ENT physician. The patient was discharged home on Midodrine for orthostatic hypotension and her metoprolol was stopped. However the patient did not tolerate midodrine and stopped it. She never started the Florinef that was discussed at discharge. She saw her PCP the day prior to her most recent admission and blood pressure was normal at 112/78. She had been cleared to fly back to [place] to stay with her other  daughter. She states that she was somewhat caught off guard and went home to try to get ready for the trip in a hurry. Her BP was running high that evening. She subsequently developed tachypalpitations in the middle of the night and went to the ER where she was found to be in AF RVR. She was treated with low dose metoprolol and started on Eliquis. She converted to NSR with only brief recurrence on telemetry. She also had brief PAT and NSVT during her admission and magnesium was repleted with quieting of arrhythmias. She also has had ongoing weight loss/poor appetite and met criteria for severe malnutrition per dietician. She was instructed to use Ensure Compete PO TID. She was instructed to f/u PCP for further evaluation of this. TSH 03/01/14 was normal. On the morning of discharge she was still orthostatic with Lying BP 127/53 HR 65; Sitting BP 119/61 HR 72; Standing Immediately BP 86/54 HR 87, Standing 3 minutes BP 96/69 HR 109. Compression stockings and abdominal binder were discussed with the patient.   Since discharge, she called in reporting severe headache which she attributed to the Eliquis although there is no incidence rate of HA reported with Eliquis. She says she traditionally has been "very sensitive to medications." CT head without contrast showed known orbital fracture, but no acute finding. She was subsequently switched to Xarelto at 25m daily (CrCl adjusted for weight/age ~39). She has not started taking this yet because the pharmacy had to order it. She began taking a bayer aspirin daily instead in the meantime. Her headaches have improved  daily. No focal neurologic symptoms. She is now feeling well. No palpitations, chest pain, SOB, orthopnea, or bleeding. She has gained 1.5 lbs since moving up to the 350cal Ensures. Orthostatics in the office today showed stable BP - lying 102/55, sitting 97/64, standing 101/62, pulse excursion not significant.  Recent Labs: 03/01/2014: TSH  1.410  03/22/2014: ALT 11; Hemoglobin 14.5  03/23/2014: Creatinine 0.86; Potassium 4.4   Wt Readings from Last 3 Encounters:  03/31/14 101 lb 1.9 oz (45.868 kg)  03/23/14 100 lb (45.36 kg)  03/21/14 100 lb (45.36 kg)     Past Medical History  Diagnosis Date  . HTN (hypertension)   . Osteoporosis   . TIA (transient ischemic attack)   . Mitral valve prolapse     a. Echo 01/2014: mild MVP.  Marland Kitchen Mitral regurgitation     a. Echo 01/2014: mod MR.  . Ascending aorta dilation     a. Echo 01/2014: mildly dilated.  . Abnormal TSH     a. Labs 01/2014:  low TSH 0.26 with free T4 1.07.  . Non-Hodgkin lymphoma     a. Tx with high dose chemotherapy at [facility] years ago. Pt reported  associated heart valve damage from chemotherapy.  . Abnormal ultrasound of carotid artery     a. 1-39% BICA 01/2014.  Marland Kitchen PAF (paroxysmal atrial fibrillation)     a. Dx 03/2014, spont converted to NSR, tx with low dose Toprol.  . Orthostatic hypotension   . Syncope     a.  syncope due to orthostatic hypotension with head injury/orbital fx 01/2014. Event monitor without arrhythmia at that time.  . Hyperlipidemia   . Anxiety   . PVC's (premature ventricular contractions)     a. Seen on tele 03/2014.  Marland Kitchen NSVT (nonsustained ventricular tachycardia)     a. Seen on tele 03/2014.  Marland Kitchen PAT (paroxysmal atrial tachycardia)     a. Seen on tele 03/2014.  Marland Kitchen Protein calorie malnutrition   . CKD (chronic kidney disease), stage II     Current Outpatient Prescriptions  Medication Sig Dispense Refill  . atorvastatin (LIPITOR) 10 MG tablet Take 10 mg by mouth daily.       Marland Kitchen escitalopram (LEXAPRO) 5 MG tablet Take 1 tablet (5 mg total) by mouth daily.      . magnesium oxide (MAG-OX) 400 (241.3 MG) MG tablet Take 1 tablet (400 mg total) by mouth daily.  90 tablet  1  . metoprolol succinate (TOPROL XL) 25 MG 24 hr tablet Take 0.5 tablets (12.5 mg total) by mouth daily.      Vladimir Faster Glycol-Propyl Glycol (SYSTANE OP) Place 1 drop into  both eyes 3 (three) times daily as needed (dry eyes).      . Rivaroxaban (XARELTO) 15 MG TABS tablet Take 1 tablet (15 mg total) by mouth daily with supper.  14 tablet  0   No current facility-administered medications for this visit.    Allergies:   Biaxin; Midodrine; and Zocor   Social History:  The patient  reports that she has never smoked. She has never used smokeless tobacco. She reports that she does not drink alcohol or use illicit drugs.   Family History:  The patient's family history includes Brain cancer in her sister; Coronary artery disease (age of onset: 42) in her father.   ROS:  Please see the history of present illness.   All other systems reviewed and negative.   PHYSICAL EXAM:  VS:  BP 97/64  Pulse 73  Ht 5' 6"  (1.676 m)  Wt 101 lb 1.9 oz (45.868 kg)  BMI 16.33 kg/m2 Thin elderly WF in no acute distress HEENT: normal Neck: no JVD Cardiac:  normal S1, S2; reg rhythm occasional ectopy; no murmur Lungs:  clear to auscultation bilaterally, no wheezing, rhonchi or rales Abd: soft, nontender, no hepatomegaly Ext: no edema Skin: warm and dry Neuro:  moves all extremities spontaneously, no focal abnormalities noted  EKG:  NSR 66bpm, one PVC, nonspecific ST-T changes which have been seen on prior tracings  ASSESSMENT AND PLAN:  1. Paroxysmal atrial fibrillation - maintaining NSR. Continue low dose metoprolol. If she has recurrent arrhythmias, she would benefit from EP evaluation given h/o significant orthostasis. I have asked her to stop aspirin and start Xarelto as previously recommended. I spent a lot of time explaining to her and her nurse about the reason for taking Xarelto instead of aspirin. She knows to monitor for any significant bleeding. 2. Orthostatic hypotension - improved. She has been educated on compression stockings and abdominal binder therapy. 3. Other arrhythmias of PAT/NSVT noted during admission - continue magnesium supplementation. One PVC noted on  EKG, otherwise quiescent by symptoms. Given absence of anginal symptoms, Dr. Percival Spanish did not recommend stress testing. 4. Headache - improving. No bleed on recent CT. She is to monitor for recurrence on Xarelto. 5. Mitral valve prolapse with MR - per Dr. Theodosia Blender notes, repeat echo due 08/2014.  Dispo: F/u 4-5 weeks with Dr. Radford Pax. I told her that if she still feels well in 1 week after transitioning to Xarelto, she is cleared to fly to [place] with a chaperone.  She really wants to attend an event for her grandson.  Signed, Melina Copa, PA-C  03/31/2014 11:00 AM

## 2014-04-03 ENCOUNTER — Telehealth: Payer: Self-pay | Admitting: Cardiology

## 2014-04-03 NOTE — Telephone Encounter (Signed)
New problem   Pt need a prescription called in for 30 days b/c she can get a free trial. Pt will cancel the 15 day. Please call pt is any questions. [facility]/phone # [phone]

## 2014-04-04 ENCOUNTER — Other Ambulatory Visit: Payer: Self-pay

## 2014-04-04 DIAGNOSIS — S028XXD Fractures of other specified skull and facial bones, subsequent encounter for fracture with routine healing: Secondary | ICD-10-CM | POA: Diagnosis not present

## 2014-04-04 DIAGNOSIS — R42 Dizziness and giddiness: Secondary | ICD-10-CM | POA: Diagnosis not present

## 2014-04-04 DIAGNOSIS — F329 Major depressive disorder, single episode, unspecified: Secondary | ICD-10-CM | POA: Diagnosis not present

## 2014-04-04 DIAGNOSIS — I10 Essential (primary) hypertension: Secondary | ICD-10-CM | POA: Diagnosis not present

## 2014-04-04 MED ORDER — RIVAROXABAN 15 MG PO TABS: 15.0000 mg | ORAL_TABLET | Freq: Every day | ORAL | Status: DC

## 2014-04-04 NOTE — Telephone Encounter (Signed)
Pt st she wants one refill of Xarelto ordered for 30 days so she can get a free trial.   Order completed.

## 2014-04-05 DIAGNOSIS — S028XXD Fractures of other specified skull and facial bones, subsequent encounter for fracture with routine healing: Secondary | ICD-10-CM | POA: Diagnosis not present

## 2014-04-05 DIAGNOSIS — F329 Major depressive disorder, single episode, unspecified: Secondary | ICD-10-CM | POA: Diagnosis not present

## 2014-04-05 DIAGNOSIS — R42 Dizziness and giddiness: Secondary | ICD-10-CM | POA: Diagnosis not present

## 2014-04-05 DIAGNOSIS — I10 Essential (primary) hypertension: Secondary | ICD-10-CM | POA: Diagnosis not present

## 2014-04-06 DIAGNOSIS — F329 Major depressive disorder, single episode, unspecified: Secondary | ICD-10-CM | POA: Diagnosis not present

## 2014-04-06 DIAGNOSIS — R42 Dizziness and giddiness: Secondary | ICD-10-CM | POA: Diagnosis not present

## 2014-04-06 DIAGNOSIS — S028XXD Fractures of other specified skull and facial bones, subsequent encounter for fracture with routine healing: Secondary | ICD-10-CM | POA: Diagnosis not present

## 2014-04-06 DIAGNOSIS — I10 Essential (primary) hypertension: Secondary | ICD-10-CM | POA: Diagnosis not present

## 2014-04-07 ENCOUNTER — Ambulatory Visit (INDEPENDENT_AMBULATORY_CARE_PROVIDER_SITE_OTHER): Payer: Medicare Other | Admitting: Cardiology

## 2014-04-07 ENCOUNTER — Encounter: Payer: Self-pay | Admitting: Cardiology

## 2014-04-07 VITALS — BP 108/62 | HR 72 | Ht 66.0 in | Wt 104.3 lb

## 2014-04-07 DIAGNOSIS — I639 Cerebral infarction, unspecified: Secondary | ICD-10-CM | POA: Diagnosis not present

## 2014-04-07 DIAGNOSIS — I480 Paroxysmal atrial fibrillation: Secondary | ICD-10-CM

## 2014-04-07 MED ORDER — RIVAROXABAN 15 MG PO TABS: 15.0000 mg | ORAL_TABLET | Freq: Every day | ORAL | Status: DC

## 2014-04-07 NOTE — Patient Instructions (Signed)
Start Xarelto 15 mg daily after supper.    Your physician recommends that you schedule a follow-up appointment in: 2 months

## 2014-04-07 NOTE — Progress Notes (Signed)
HPI The patient presents for evaluation of mitral valve prolapse with moderate mitral regurgitation. She is also had syncope. She was hospitalized and treated with Midrin for orthostatic hypotension. However she did not tolerate this. She has also now been seen in emergency room for atrial fibrillation with rapid ventricular response. Started on Eliquis. She did convert to sinus rhythm. She did have headache with her anticoagulation with a CT demonstrated no acute bleeding.  She was subsequently switched to Xarelto..  She has not yet started this.  She says that she is feeling well.  The patient denies any new symptoms such as chest discomfort, neck or arm discomfort. There has been no new shortness of breath, PND or orthopnea. There have been no reported palpitations, presyncope or syncope.  Of note she did not restart her Xarelto.   Allergies  Allergen Reactions  . Biaxin [Clarithromycin] Other (See Comments)    Severe stomach burning  . Midodrine Other (See Comments)    Rash, headache, shakey  . Zocor [Simvastatin] Rash    Tolerating Lipitor  . Eliquis [Apixaban] Nausea Only and Other (See Comments)    Headaches    Current Outpatient Prescriptions  Medication Sig Dispense Refill  . atorvastatin (LIPITOR) 10 MG tablet Take 10 mg by mouth daily.     Marland Kitchen escitalopram (LEXAPRO) 5 MG tablet Take 1 tablet (5 mg total) by mouth daily.    . magnesium oxide (MAG-OX) 400 (241.3 MG) MG tablet Take 1 tablet (400 mg total) by mouth daily. 90 tablet 1  . metoprolol succinate (TOPROL XL) 25 MG 24 hr tablet Take 0.5 tablets (12.5 mg total) by mouth daily.    Vladimir Faster Glycol-Propyl Glycol (SYSTANE OP) Place 1 drop into both eyes 3 (three) times daily as needed (dry eyes).    . Rivaroxaban (XARELTO) 15 MG TABS tablet Take 1 tablet (15 mg total) by mouth daily with supper. 30 tablet 0   No current facility-administered medications for this visit.    Past Medical History  Diagnosis Date  . HTN  (hypertension)   . Osteoporosis   . TIA (transient ischemic attack)   . Mitral valve prolapse     a. Echo 01/2014: mild MVP.  Marland Kitchen Mitral regurgitation     a. Echo 01/2014: mod MR.  . Ascending aorta dilation     a. Echo 01/2014: mildly dilated.  . Abnormal TSH     a. Labs 01/2014:  low TSH 0.26 with free T4 1.07.  . Non-Hodgkin lymphoma     a. Tx with high dose chemotherapy at [facility] years ago. Pt reported  associated heart valve damage from chemotherapy.  . Abnormal ultrasound of carotid artery     a. 1-39% BICA 01/2014.  Marland Kitchen PAF (paroxysmal atrial fibrillation)     a. Dx 03/2014, spont converted to NSR, tx with low dose Toprol.  . Orthostatic hypotension   . Syncope     a.  syncope due to orthostatic hypotension with head injury/orbital fx 01/2014. Event monitor without arrhythmia at that time.  . Hyperlipidemia   . Anxiety   . PVC's (premature ventricular contractions)     a. Seen on tele 03/2014.  Marland Kitchen NSVT (nonsustained ventricular tachycardia)     a. Seen on tele 03/2014.  Marland Kitchen PAT (paroxysmal atrial tachycardia)     a. Seen on tele 03/2014.  Marland Kitchen Protein calorie malnutrition   . CKD (chronic kidney disease), stage II     Past Surgical History  Procedure Laterality Date  .  Cataract extraction    . Oral tooth      ROS:  As stated in the HPI and negative for all other systems.  PHYSICAL EXAM BP 108/62 mmHg  Pulse 72  Ht 5\' 6"  (1.676 m)  Wt 104 lb 4.8 oz (47.31 kg)  BMI 16.84 kg/m2 GENERAL:  Very thin HEENT:  Pupils equal round and reactive, fundi not visualized, oral mucosa unremarkable NECK:  No jugular venous distention, waveform within normal limits, carotid upstroke brisk and symmetric, no bruits, no thyromegaly LYMPHATICS:  No cervical, inguinal adenopathy LUNGS:  Clear to auscultation bilaterally BACK:  No CVA tenderness CHEST:  Unremarkable HEART:  PMI not displaced or sustained,S1 and S2 within normal limits, no S3, no S4, no clicks, no rubs, no murmurs ABD:  Flat,  positive bowel sounds normal in frequency in pitch, no bruits, no rebound, no guarding, no midline pulsatile mass, no hepatomegaly, no splenomegaly EXT:  2 plus pulses throughout, no edema, no cyanosis no clubbing SKIN:  No rashes no nodules NEURO:  Cranial nerves II through XII grossly intact, motor grossly intact throughout PSYCH:  Cognitively intact, oriented to person place and time    ASSESSMENT AND PLAN  PAF:  She has had PAF. I would suggest that this is non valvular as her MR and MVP are not severe.  She reports a TIA.  Therefore her Ms. Deena Shaub has a CHA2DS2 - VASc score of 7  with a risk of stroke of 9.6%  and a HAS - BLED is high risk.  She and I discussed this at length with the risks and benefits. We did review her low to moderate risk of bleeding and the risk with all these agents with CNS bleeding. Her creatinine clearance I calculate his 40. Her dose needed would be 15 mg. At this point understanding the risks and benefits in her situation she will likely try start the Xarelto.  I did have our pharmacist speak with her as well.  ORTHOSTATIC HYPOTNESION:  This has not been particularly problematic. No change in therapy is indicated.  MVP/MR:  Follow up echo in March of next year.

## 2014-04-10 ENCOUNTER — Other Ambulatory Visit: Payer: Self-pay

## 2014-04-10 MED ORDER — MAGNESIUM OXIDE 400 (241.3 MG) MG PO TABS: 400.0000 mg | ORAL_TABLET | Freq: Every day | ORAL | Status: DC

## 2014-04-10 NOTE — Telephone Encounter (Signed)
Patient has had CT and aware of results

## 2014-04-10 NOTE — Telephone Encounter (Signed)
Error

## 2014-04-11 ENCOUNTER — Encounter: Payer: Medicare Other | Admitting: Physician Assistant

## 2014-04-11 DIAGNOSIS — F329 Major depressive disorder, single episode, unspecified: Secondary | ICD-10-CM | POA: Diagnosis not present

## 2014-04-11 DIAGNOSIS — S028XXD Fractures of other specified skull and facial bones, subsequent encounter for fracture with routine healing: Secondary | ICD-10-CM | POA: Diagnosis not present

## 2014-04-11 DIAGNOSIS — R42 Dizziness and giddiness: Secondary | ICD-10-CM | POA: Diagnosis not present

## 2014-04-11 DIAGNOSIS — I10 Essential (primary) hypertension: Secondary | ICD-10-CM | POA: Diagnosis not present

## 2014-04-12 DIAGNOSIS — F329 Major depressive disorder, single episode, unspecified: Secondary | ICD-10-CM | POA: Diagnosis not present

## 2014-04-12 DIAGNOSIS — R42 Dizziness and giddiness: Secondary | ICD-10-CM | POA: Diagnosis not present

## 2014-04-12 DIAGNOSIS — S028XXD Fractures of other specified skull and facial bones, subsequent encounter for fracture with routine healing: Secondary | ICD-10-CM | POA: Diagnosis not present

## 2014-04-12 DIAGNOSIS — I10 Essential (primary) hypertension: Secondary | ICD-10-CM | POA: Diagnosis not present

## 2014-04-13 DIAGNOSIS — I10 Essential (primary) hypertension: Secondary | ICD-10-CM | POA: Diagnosis not present

## 2014-04-13 DIAGNOSIS — R42 Dizziness and giddiness: Secondary | ICD-10-CM | POA: Diagnosis not present

## 2014-04-13 DIAGNOSIS — S028XXD Fractures of other specified skull and facial bones, subsequent encounter for fracture with routine healing: Secondary | ICD-10-CM | POA: Diagnosis not present

## 2014-04-13 DIAGNOSIS — F329 Major depressive disorder, single episode, unspecified: Secondary | ICD-10-CM | POA: Diagnosis not present

## 2014-04-14 ENCOUNTER — Ambulatory Visit: Payer: Medicare Other | Admitting: Cardiology

## 2014-04-18 DIAGNOSIS — S028XXD Fractures of other specified skull and facial bones, subsequent encounter for fracture with routine healing: Secondary | ICD-10-CM | POA: Diagnosis not present

## 2014-04-18 DIAGNOSIS — F329 Major depressive disorder, single episode, unspecified: Secondary | ICD-10-CM | POA: Diagnosis not present

## 2014-04-18 DIAGNOSIS — R42 Dizziness and giddiness: Secondary | ICD-10-CM | POA: Diagnosis not present

## 2014-04-18 DIAGNOSIS — I10 Essential (primary) hypertension: Secondary | ICD-10-CM | POA: Diagnosis not present

## 2014-04-20 DIAGNOSIS — S028XXD Fractures of other specified skull and facial bones, subsequent encounter for fracture with routine healing: Secondary | ICD-10-CM | POA: Diagnosis not present

## 2014-04-20 DIAGNOSIS — I10 Essential (primary) hypertension: Secondary | ICD-10-CM | POA: Diagnosis not present

## 2014-04-20 DIAGNOSIS — F329 Major depressive disorder, single episode, unspecified: Secondary | ICD-10-CM | POA: Diagnosis not present

## 2014-04-20 DIAGNOSIS — R42 Dizziness and giddiness: Secondary | ICD-10-CM | POA: Diagnosis not present

## 2014-04-25 ENCOUNTER — Telehealth: Payer: Self-pay | Admitting: Neurology

## 2014-04-25 NOTE — Telephone Encounter (Signed)
Left message for patient regarding rescheduling 06/16/14 appointment time per Carolyn's schedule, moved time to be earlier on same day.

## 2014-04-28 DIAGNOSIS — S028XXD Fractures of other specified skull and facial bones, subsequent encounter for fracture with routine healing: Secondary | ICD-10-CM | POA: Diagnosis not present

## 2014-04-28 DIAGNOSIS — F329 Major depressive disorder, single episode, unspecified: Secondary | ICD-10-CM | POA: Diagnosis not present

## 2014-04-28 DIAGNOSIS — R42 Dizziness and giddiness: Secondary | ICD-10-CM | POA: Diagnosis not present

## 2014-04-28 DIAGNOSIS — I10 Essential (primary) hypertension: Secondary | ICD-10-CM | POA: Diagnosis not present

## 2014-04-29 DIAGNOSIS — I10 Essential (primary) hypertension: Secondary | ICD-10-CM | POA: Diagnosis not present

## 2014-04-29 DIAGNOSIS — R42 Dizziness and giddiness: Secondary | ICD-10-CM | POA: Diagnosis not present

## 2014-04-29 DIAGNOSIS — F329 Major depressive disorder, single episode, unspecified: Secondary | ICD-10-CM | POA: Diagnosis not present

## 2014-04-29 DIAGNOSIS — S028XXD Fractures of other specified skull and facial bones, subsequent encounter for fracture with routine healing: Secondary | ICD-10-CM | POA: Diagnosis not present

## 2014-05-02 ENCOUNTER — Telehealth: Payer: Self-pay | Admitting: Cardiology

## 2014-05-02 NOTE — Telephone Encounter (Signed)
Cardiac Cath rec From Dr.Yeatman [facility] placed in Basket For Chart Prep Room/KM

## 2014-05-03 ENCOUNTER — Ambulatory Visit: Payer: Medicare Other | Admitting: Cardiology

## 2014-05-11 ENCOUNTER — Encounter: Payer: Self-pay | Admitting: Cardiology

## 2014-05-11 ENCOUNTER — Ambulatory Visit (INDEPENDENT_AMBULATORY_CARE_PROVIDER_SITE_OTHER): Payer: Medicare Other | Admitting: Cardiology

## 2014-05-11 VITALS — BP 130/90 | HR 59 | Ht 67.0 in | Wt 106.0 lb

## 2014-05-11 DIAGNOSIS — I10 Essential (primary) hypertension: Secondary | ICD-10-CM

## 2014-05-11 DIAGNOSIS — R55 Syncope and collapse: Secondary | ICD-10-CM | POA: Diagnosis not present

## 2014-05-11 DIAGNOSIS — I341 Nonrheumatic mitral (valve) prolapse: Secondary | ICD-10-CM | POA: Diagnosis not present

## 2014-05-11 DIAGNOSIS — I951 Orthostatic hypotension: Secondary | ICD-10-CM

## 2014-05-11 DIAGNOSIS — I340 Nonrheumatic mitral (valve) insufficiency: Secondary | ICD-10-CM

## 2014-05-11 DIAGNOSIS — I639 Cerebral infarction, unspecified: Secondary | ICD-10-CM

## 2014-05-11 DIAGNOSIS — I480 Paroxysmal atrial fibrillation: Secondary | ICD-10-CM

## 2014-05-11 NOTE — Progress Notes (Signed)
[address] [place], [place]  [address] Phone: [phone] Fax:  [phone]  Date:  05/11/2014   ID:  [name], DOB Dec 30, 1933, MRN [id number]  PCP:  Sierra Pry, DO  Cardiologist:  Sierra Him, MD    History of Present Illness: 80 year old white female with history of hypertension, TIA and non-Hodgkin's lymphoma who recently saw me to establish a Cardiologist. Patient states she splits her time from living in [place] and [place]. She grew up in the [place] but she has grandchildren that live in [place]. She was diagnosed with non-Hodgkin's lymphoma of her stomach and treated with high-dose chemotherapy at [facility] years ago. She reports there was associated heart valve damage from chemotherapy. She was evaluated by cardiologist at [facility] in the past. She reports cardiac catheterization performed 4 years ago. No report of coronary artery disease. No official records to review. Patient has history of hypertension.  She has been struggling with issues of poor appetite and weight loss recently. When I saw her last she had noticed frequent episodes of low blood pressure readings in the morning. She also had noticed problems with palpitations that usually occur in the am. She said that she would get palpitations and then feel lightheaded and then feel like she was going to pass out. She denied any chest pain and or SOB. She occasionally has some LE edema. Her HR would get up in to the 130's but resolves with rest. She is usually active. She walks daily and also practices yoga. On exam at that time she was orthostatic and I stopped her ramipril and increase her fluid intake.  2D echo showed normal LVF with bileaflet MVP with moderate MR.  Since I saw her last in mid-September 2015, she had an episode of syncope while standing in line. She was wearing an event monitor at th eevent monitor interrogation which did not show any arrhythmias. She was found to be orthostatic during  that admission and metoprolol was stopped. As a result of her syncopal episode on 03/01/14 she suffered a laceration above her right eyebrow and had a nondisplaced fracture of the superior aspect of the right orbit. [name] is her ENT physician. The patient was discharged home on Midodrine for orthostatic hypotension and her metoprolol was stopped. However the patient did not tolerate midodrine and stopped it.. She never started the Florinef that was discussed at discharge. She saw her PCP the day prior to repeat admission and blood pressure was normal at 112/78. She had been cleared to fly back to [place] to stay with her other daughter. She states that she was somewhat caught off guard and went home to try to get ready for the trip in a hurry.  Later that evening her blood pressure felt high to her and she checked it and it was 366 systolic at home. She awoke around midnight with tachypalpitations. She was able to go back to sleep but symptoms persisted so she came to the ER 03/22/2014 where she was felt to be in atrial fibrillation with RVR. Prior to receiving the ordered dose of metoprolol, she went back into NSR on her own. She had not been experiencing any recent chest pain, recurrent dizzy spells or syncope. She has had unexplained weight loss however over the last 6 months. Thyroid function testing previously showed abnormal TSH but more recent studies in September showed normal TSH and free T4. CXR was nonacute. Troponin was negative. She was placed on very low dose Toprol XL  12.5mg  daily as well as Eliquis 5mg  BID but later changed to Xarelto due to HA. She had very brief recurrence of AF the day she started the metoprolol, but no further past that. She did have two brief runs of what appeared to be PAT during that admission, as well as brief 5-14 beats of NSVT. Mg was 1.8 - that was repleted with IV magnesium and she was started on MagOx. She has not had any further significant arrhythmias. Compression  stockings as well as abdominal binder were discussed with the patient.   She now presents back for followup.    She is doing well today.  She has not had any further dizziness or palpitations.  She denies any LE edema, chest pain, SOB, DOE or syncope.  She walks 0.5 miles daily and does yoga.  She denies any weakness or fatigue.  She has not had any unstable gait or falling.    Wt Readings from Last 3 Encounters:  05/11/14 106 lb (48.081 kg)  04/07/14 104 lb 4.8 oz (47.31 kg)  03/31/14 101 lb 1.9 oz (45.868 kg)     Past Medical History  Diagnosis Date  . HTN (hypertension)   . Osteoporosis   . TIA (transient ischemic attack)   . Mitral valve prolapse     a. Echo 01/2014: mild MVP.  Marland Kitchen Mitral regurgitation     a. Echo 01/2014: mod MR.  . Ascending aorta dilation     a. Echo 01/2014: mildly dilated.  . Abnormal TSH     a. Labs 01/2014:  low TSH 0.26 with free T4 1.07.  . Non-Hodgkin lymphoma     a. Tx with high dose chemotherapy at [facility] years ago. Pt reported  associated heart valve damage from chemotherapy.  . Abnormal ultrasound of carotid artery     a. 1-39% BICA 01/2014.  Marland Kitchen PAF (paroxysmal atrial fibrillation)     a. Dx 03/2014, spont converted to NSR, tx with low dose Toprol.  . Orthostatic hypotension   . Syncope     a.  syncope due to orthostatic hypotension with head injury/orbital fx 01/2014. Event monitor without arrhythmia at that time.  . Hyperlipidemia   . Anxiety   . PVC's (premature ventricular contractions)     a. Seen on tele 03/2014.  Marland Kitchen NSVT (nonsustained ventricular tachycardia)     a. Seen on tele 03/2014.  Marland Kitchen PAT (paroxysmal atrial tachycardia)     a. Seen on tele 03/2014.  Marland Kitchen Protein calorie malnutrition   . CKD (chronic kidney disease), stage II     Current Outpatient Prescriptions  Medication Sig Dispense Refill  . atorvastatin (LIPITOR) 10 MG tablet Take 10 mg by mouth daily.     Marland Kitchen escitalopram (LEXAPRO) 5 MG tablet Take 1 tablet (5 mg total) by mouth  daily.    . magnesium oxide (MAG-OX) 400 (241.3 MG) MG tablet Take 1 tablet (400 mg total) by mouth daily. 90 tablet 1  . metoprolol succinate (TOPROL XL) 25 MG 24 hr tablet Take 0.5 tablets (12.5 mg total) by mouth daily.    Vladimir Faster Glycol-Propyl Glycol (SYSTANE OP) Place 1 drop into both eyes 3 (three) times daily as needed (dry eyes).    . Rivaroxaban (XARELTO) 15 MG TABS tablet Take 1 tablet (15 mg total) by mouth daily with supper. 30 tablet 6   No current facility-administered medications for this visit.    Allergies:    Allergies  Allergen Reactions  . Biaxin [Clarithromycin] Other (See Comments)  Severe stomach burning  . Midodrine Other (See Comments)    Rash, headache, shakey  . Zocor [Simvastatin] Rash    Tolerating Lipitor  . Eliquis [Apixaban] Nausea Only and Other (See Comments)    Headaches    Social History:  The patient  reports that she has never smoked. She has never used smokeless tobacco. She reports that she does not drink alcohol or use illicit drugs.   Family History:  The patient's family history includes Brain cancer in her sister; Coronary artery disease (age of onset: 86) in her father.   ROS:  Please see the history of present illness.      All other systems reviewed and negative.   PHYSICAL EXAM: VS:  BP 130/90 mmHg  Pulse 59  Ht 5\' 7"  (1.702 m)  Wt 106 lb (48.081 kg)  BMI 16.60 kg/m2 Well nourished, well developed, in no acute distress HEENT: normal Neck: no JVD Cardiac:  normal S1, S2; RRR; no murmur Lungs:  clear to auscultation bilaterally, no wheezing, rhonchi or rales Abd: soft, nontender, no hepatomegaly Ext: no edema Skin: warm and dry Neuro:  CNs 2-12 intact, no focal abnormalities noted  EKG:  Sinus bradycardia at 59bpm with no ST changes     ASSESSMENT AND PLAN:  1. Paroxysmal atrial fibrillation - maintaining NSR. Continue low dose metoprolol. If she has recurrent arrhythmias, she would benefit from EP evaluation given  h/o significant orthostasis. Continue Xarelto 2. Orthostatic hypotension - resolved. 3. Other arrhythmias of PAT/NSVT noted during admission - continue magnesium supplementation. Given absence of anginal symptoms and normal LVF on echo, no stress testing recommended at this time. 4. Headache - resolved. No bleed by head CT 5. Moderate MR with MVP - repeat echo in 6 months 6. Mildly dilated aortic root - to continue to follow with serial echo  I have told her that I think she is stable to travel to [place].  She will followup with me in 3 months  Signed, Sierra Him, MD [facility] 05/11/2014 2:04 PM

## 2014-05-11 NOTE — Patient Instructions (Signed)
Your physician recommends that you continue on your current medications as directed. Please refer to the Current Medication list given to you today.  Your physician recommends that you schedule a follow-up appointment in: 3 months with Dr Turner 

## 2014-05-19 ENCOUNTER — Ambulatory Visit: Payer: Medicare Other | Admitting: Cardiology

## 2014-05-24 ENCOUNTER — Telehealth: Payer: Self-pay | Admitting: Cardiology

## 2014-05-24 NOTE — Telephone Encounter (Signed)
Received records from [facility] for appointment with Dr Percival Spanish on 05/25/14.  Records from [facility] ,  Records given to [employer] (medical records) for Dr Hochrein's schedule on 05/25/14.  lp

## 2014-05-25 ENCOUNTER — Ambulatory Visit: Payer: Medicare Other | Admitting: Cardiology

## 2014-06-16 ENCOUNTER — Ambulatory Visit: Payer: Medicare Other | Admitting: Nurse Practitioner

## 2014-07-03 ENCOUNTER — Ambulatory Visit: Payer: Medicare Other | Admitting: Nurse Practitioner

## 2014-07-04 ENCOUNTER — Encounter: Payer: Self-pay | Admitting: Cardiology

## 2014-07-04 ENCOUNTER — Ambulatory Visit (INDEPENDENT_AMBULATORY_CARE_PROVIDER_SITE_OTHER): Payer: Medicare Other | Admitting: Cardiology

## 2014-07-04 VITALS — BP 94/70 | HR 63 | Ht 66.0 in | Wt 106.0 lb

## 2014-07-04 DIAGNOSIS — I10 Essential (primary) hypertension: Secondary | ICD-10-CM

## 2014-07-04 NOTE — Patient Instructions (Signed)
Your physician recommends that you schedule a follow-up appointment in: September 2016   You will be having an Echo the same day of your appt

## 2014-07-04 NOTE — Progress Notes (Signed)
HPI The patient presents for evaluation of mitral valve prolapse with moderate mitral regurgitation. She is also had syncope with orthostatic hypotension. She has also now been seen in emergency room for atrial fibrillation with rapid ventricular response. Since I last saw her she has had no new cardiovascular complaints. She hasn't had any of her previous orthostatic symptoms. She is able to do yoga for an hour every day. She's not noticed any palpitations, presyncope or syncope. She tolerates Xarelto without problems.  Allergies  Allergen Reactions  . Biaxin [Clarithromycin] Other (See Comments)    Severe stomach burning  . Midodrine Other (See Comments)    Rash, headache, shakey  . Zocor [Simvastatin] Rash    Tolerating Lipitor  . Eliquis [Apixaban] Nausea Only and Other (See Comments)    Headaches    Current Outpatient Prescriptions  Medication Sig Dispense Refill  . atorvastatin (LIPITOR) 10 MG tablet Take 10 mg by mouth daily.     Marland Kitchen escitalopram (LEXAPRO) 5 MG tablet Take 1 tablet (5 mg total) by mouth daily.    . magnesium oxide (MAG-OX) 400 (241.3 MG) MG tablet Take 1 tablet (400 mg total) by mouth daily. 90 tablet 1  . metoprolol succinate (TOPROL XL) 25 MG 24 hr tablet Take 0.5 tablets (12.5 mg total) by mouth daily.    Vladimir Faster Glycol-Propyl Glycol (SYSTANE OP) Place 1 drop into both eyes 3 (three) times daily as needed (dry eyes).    . Rivaroxaban (XARELTO) 15 MG TABS tablet Take 1 tablet (15 mg total) by mouth daily with supper. 30 tablet 6   No current facility-administered medications for this visit.    Past Medical History  Diagnosis Date  . HTN (hypertension)   . Osteoporosis   . TIA (transient ischemic attack)   . Mitral valve prolapse     a. Echo 01/2014: mild MVP.  Marland Kitchen Mitral regurgitation     a. Echo 01/2014: mod MR.  . Ascending aorta dilation     a. Echo 01/2014: mildly dilated.  . Abnormal TSH     a. Labs 01/2014:  low TSH 0.26 with free T4 1.07.  .  Non-Hodgkin lymphoma     a. Tx with high dose chemotherapy at [facility] years ago. Pt reported  associated heart valve damage from chemotherapy.  . Abnormal ultrasound of carotid artery     a. 1-39% BICA 01/2014.  Marland Kitchen PAF (paroxysmal atrial fibrillation)     a. Dx 03/2014, spont converted to NSR, tx with low dose Toprol.  . Orthostatic hypotension   . Syncope     a.  syncope due to orthostatic hypotension with head injury/orbital fx 01/2014. Event monitor without arrhythmia at that time.  . Hyperlipidemia   . Anxiety   . PVC's (premature ventricular contractions)     a. Seen on tele 03/2014.  Marland Kitchen NSVT (nonsustained ventricular tachycardia)     a. Seen on tele 03/2014.  Marland Kitchen PAT (paroxysmal atrial tachycardia)     a. Seen on tele 03/2014.  Marland Kitchen Protein calorie malnutrition   . CKD (chronic kidney disease), stage II     Past Surgical History  Procedure Laterality Date  . Cataract extraction    . Oral tooth      ROS:  As stated in the HPI and negative for all other systems.  PHYSICAL EXAM BP 94/70 mmHg  Pulse 63  Ht 5\' 6"  (1.676 m)  Wt 106 lb (48.081 kg)  BMI 17.12 kg/m2 GENERAL:  Very thin HEENT:  Pupils equal round and reactive, fundi not visualized, oral mucosa unremarkable NECK:  No jugular venous distention, waveform within normal limits, carotid upstroke brisk and symmetric, no bruits, no thyromegaly LUNGS:  Clear to auscultation bilaterally CHEST:  Unremarkable HEART:  PMI not displaced or sustained,S1 and S2 within normal limits, no S3, no S4, no clicks, no rubs, no murmurs ABD:  Flat, positive bowel sounds normal in frequency in pitch, no bruits, no rebound, no guarding, no midline pulsatile mass, no hepatomegaly, no splenomegaly EXT:  2 plus pulses throughout, no edema, no cyanosis no clubbing SKIN:  No rashes no nodules   EKG:  Sinus rhythm, rate 63, axis within normal limits, nonspecific T-wave flattening.  07/04/2014   ASSESSMENT AND PLAN  PAF:  She has had PAF. I would  suggest that this is non valvular as her MR and MVP are not severe.  She reports a TIA.  Therefore her Ms. [name] has a CHA2DS2 - VASc score of 7  with a risk of stroke of 9.6%  and a HAS - BLED is high risk.  She will continue with current therapy.  ORTHOSTATIC HYPOTNESION:  This has not been particularly problematic. No change in therapy is indicated.  MVP/MR:  Follow up echo in September of this year.  I will see her on that day.

## 2014-07-31 ENCOUNTER — Ambulatory Visit (INDEPENDENT_AMBULATORY_CARE_PROVIDER_SITE_OTHER): Payer: Medicare Other | Admitting: Internal Medicine

## 2014-07-31 ENCOUNTER — Encounter: Payer: Self-pay | Admitting: Internal Medicine

## 2014-07-31 VITALS — BP 112/80 | HR 72 | Temp 97.8°F | Ht 66.0 in | Wt 108.0 lb

## 2014-07-31 DIAGNOSIS — J111 Influenza due to unidentified influenza virus with other respiratory manifestations: Secondary | ICD-10-CM

## 2014-07-31 DIAGNOSIS — R52 Pain, unspecified: Secondary | ICD-10-CM

## 2014-07-31 LAB — POCT INFLUENZA A/B
INFLUENZA A, POC: NEGATIVE
INFLUENZA B, POC: NEGATIVE

## 2014-07-31 MED ORDER — AMOXICILLIN 500 MG PO CAPS: 500.0000 mg | ORAL_CAPSULE | Freq: Two times a day (BID) | ORAL | Status: DC

## 2014-07-31 MED ORDER — OSELTAMIVIR PHOSPHATE 75 MG PO CAPS: 75.0000 mg | ORAL_CAPSULE | Freq: Two times a day (BID) | ORAL | Status: DC

## 2014-07-31 NOTE — Progress Notes (Signed)
Subjective:    Patient ID: [name], female    DOB: December 10, 1933, 81 y.o.   MRN: [id number]  HPI  81 year old white female with history of hypertension, atrial fibrillation and non-Hodgkin's lymphoma presents with flulike symptoms. She complains of body aches, nasal congestion and fatigue. Her daughter who is a Marine scientist recently diagnosed with confirmed influenza.  Patient reports she is prone to sinus infections.  Review of Systems Body aches and fatigue    Past Medical History  Diagnosis Date  . HTN (hypertension)   . Osteoporosis   . TIA (transient ischemic attack)   . Mitral valve prolapse     a. Echo 01/2014: mild MVP.  Marland Kitchen Mitral regurgitation     a. Echo 01/2014: mod MR.  . Ascending aorta dilation     a. Echo 01/2014: mildly dilated.  . Abnormal TSH     a. Labs 01/2014:  low TSH 0.26 with free T4 1.07.  . Non-Hodgkin lymphoma     a. Tx with high dose chemotherapy at [facility] years ago. Pt reported  associated heart valve damage from chemotherapy.  . Abnormal ultrasound of carotid artery     a. 1-39% BICA 01/2014.  Marland Kitchen PAF (paroxysmal atrial fibrillation)     a. Dx 03/2014, spont converted to NSR, tx with low dose Toprol.  . Orthostatic hypotension   . Syncope     a.  syncope due to orthostatic hypotension with head injury/orbital fx 01/2014. Event monitor without arrhythmia at that time.  . Hyperlipidemia   . Anxiety   . PVC's (premature ventricular contractions)     a. Seen on tele 03/2014.  Marland Kitchen NSVT (nonsustained ventricular tachycardia)     a. Seen on tele 03/2014.  Marland Kitchen PAT (paroxysmal atrial tachycardia)     a. Seen on tele 03/2014.  Marland Kitchen Protein calorie malnutrition   . CKD (chronic kidney disease), stage II     History   Social History  . Marital Status: Widowed    Spouse Name: N/A  . Number of Children: 78  . Years of Education: college   Occupational History  .      Retired   Social History Main Topics  . Smoking status: Never Smoker   . Smokeless tobacco:  Never Used  . Alcohol Use: No  . Drug Use: No  . Sexual Activity: Not on file   Other Topics Concern  . Not on file   Social History Narrative   Patient lives with daughter when she is in [place]   She is widowed. Husband passed away 7 yrs ago.   Retired   Veterinary surgeon.   Right handed.   Caffeine - None    Past Surgical History  Procedure Laterality Date  . Cataract extraction    . Oral tooth      Family History  Problem Relation Age of Onset  . Coronary artery disease Father 30  . Brain cancer Sister     Allergies  Allergen Reactions  . Biaxin [Clarithromycin] Other (See Comments)    Severe stomach burning  . Midodrine Other (See Comments)    Rash, headache, shakey  . Zocor [Simvastatin] Rash    Tolerating Lipitor  . Eliquis [Apixaban] Nausea Only and Other (See Comments)    Headaches    Current Outpatient Prescriptions on File Prior to Visit  Medication Sig Dispense Refill  . atorvastatin (LIPITOR) 10 MG tablet Take 10 mg by mouth daily.     Marland Kitchen escitalopram (LEXAPRO) 5 MG tablet Take  1 tablet (5 mg total) by mouth daily.    . magnesium oxide (MAG-OX) 400 (241.3 MG) MG tablet Take 1 tablet (400 mg total) by mouth daily. 90 tablet 1  . metoprolol succinate (TOPROL XL) 25 MG 24 hr tablet Take 0.5 tablets (12.5 mg total) by mouth daily.    Vladimir Faster Glycol-Propyl Glycol (SYSTANE OP) Place 1 drop into both eyes 3 (three) times daily as needed (dry eyes).    . Rivaroxaban (XARELTO) 15 MG TABS tablet Take 1 tablet (15 mg total) by mouth daily with supper. 30 tablet 6   No current facility-administered medications on file prior to visit.    BP 112/80 mmHg  Pulse 72  Temp(Src) 97.8 F (36.6 C) (Oral)  Ht 5\' 6"  (1.676 m)  Wt 108 lb (48.988 kg)  BMI 17.44 kg/m2    Objective:   Physical Exam  Constitutional: She is oriented to person, place, and time.  Thin, slightly ill appearing 81 year old female.    Cardiovascular: Normal rate and normal heart  sounds.   Pulmonary/Chest: Effort normal and breath sounds normal. She has no wheezes.  Neurological: She is alert and oriented to person, place, and time. No cranial nerve deficit.  Psychiatric: She has a normal mood and affect. Her behavior is normal.          Assessment & Plan:

## 2014-07-31 NOTE — Assessment & Plan Note (Signed)
81 year old white female with history of non-Hodgkin's lymphoma, hypertension and a short fibrillation presents with flulike illness. Her daughter who is nurse originally diagnosed with confirmed influenza. Despite negative rapid influenza test treat empirically with Tamiflu 75 mg twice daily. Patient reports she is prone to sinus infections. Treat symptomatically for now. Should she develop signs and symptoms of sinus infection, prescription for amoxicillin 500 mg twice daily for 7 days provided. Patient advised to call office if symptoms persist or worsen.

## 2014-07-31 NOTE — Patient Instructions (Signed)
Increase fluid intake Please contact our office if your symptoms do not improve or gets worse. Only use amoxicillin if your symptoms do not improve within 1-2 weeks

## 2014-07-31 NOTE — Progress Notes (Signed)
Pre visit review using our clinic review tool, if applicable. No additional management support is needed unless otherwise documented below in the visit note. 

## 2014-08-01 ENCOUNTER — Telehealth: Payer: Self-pay | Admitting: Internal Medicine

## 2014-08-01 NOTE — Telephone Encounter (Signed)
Pt called and said the following med need to be called in to [facility] oseltamivir (TAMIFLU) 75 MG capsule. Pt said it will cost 175.00 at [facility] said the pharmacy will not refill a rx for less than 30 pills    Phone number  [phone]

## 2014-08-02 NOTE — Telephone Encounter (Signed)
I am concerned it will take too long.  If she hasn't started medication, she will not benefit much from it.  I suggest she continue supportive care - increased fluids, rest, tylenol for pain

## 2014-08-02 NOTE — Telephone Encounter (Signed)
Pt aware.

## 2014-08-02 NOTE — Telephone Encounter (Signed)
Is this okay?

## 2014-08-23 ENCOUNTER — Ambulatory Visit: Payer: Self-pay | Admitting: Nurse Practitioner

## 2014-08-24 ENCOUNTER — Encounter: Payer: Self-pay | Admitting: Nurse Practitioner

## 2014-08-25 ENCOUNTER — Telehealth: Payer: Self-pay | Admitting: Cardiology

## 2014-08-25 MED ORDER — METOPROLOL SUCCINATE ER 25 MG PO TB24
25.0000 mg | ORAL_TABLET | Freq: Every day | ORAL | Status: DC
Start: 2014-08-25 — End: 2015-03-02

## 2014-08-25 NOTE — Telephone Encounter (Signed)
Please call asap,having atrial fib and heart rate is about125 or 130.

## 2014-08-25 NOTE — Telephone Encounter (Signed)
Pt of Dr. Percival Spanish   Hx of PAF  Pt states "nervous energy", HR up today. Seemed to have come on suddenly, early this AM.  She denies pain, dyspnea.  States "a little bit" fatigued.  BP 135/81, HR 131.  Metoprolol Succ 12.5mg  DAILY  Will route to Dr. Martinique to advise.

## 2014-08-25 NOTE — Telephone Encounter (Signed)
°  1. Which medications need to be refilled? Metoprolol  2. Which pharmacy [facility] sent to[facility] [address]  3. Do they need a 30 day or 90 day supply? 15 day  4. Would they like a call back once the medication has been sent to the pharmacy? Yes  *SHE IS WAITING ON HER MAIL ORDER MEDS TO COME IN*

## 2014-08-25 NOTE — Telephone Encounter (Addendum)
Communicated Dr. Doug Sou instructions to patient. Advised for worsening symptoms or concerns use on-call line - update on Monday w progress - if needed can get add-in on MLP schedule.   Pt voiced understanding w/ given instruction.

## 2014-08-25 NOTE — Telephone Encounter (Signed)
Pt advised rx was sent to pharmacy. She voiced thanks, no further needs.

## 2014-08-25 NOTE — Addendum Note (Signed)
Addended by: Theodore Demark on: 08/25/2014 01:26 PM   Modules accepted: Orders, Medications

## 2014-08-25 NOTE — Telephone Encounter (Signed)
I would increase metoprolol to 25 mg daily. If symptoms persists she will need to be seen next week.  Peter Martinique MD, [facility]

## 2014-08-30 DIAGNOSIS — M9901 Segmental and somatic dysfunction of cervical region: Secondary | ICD-10-CM | POA: Diagnosis not present

## 2014-08-30 DIAGNOSIS — M25552 Pain in left hip: Secondary | ICD-10-CM | POA: Diagnosis not present

## 2014-08-30 DIAGNOSIS — M5135 Other intervertebral disc degeneration, thoracolumbar region: Secondary | ICD-10-CM | POA: Diagnosis not present

## 2014-08-30 DIAGNOSIS — M9905 Segmental and somatic dysfunction of pelvic region: Secondary | ICD-10-CM | POA: Diagnosis not present

## 2014-08-30 DIAGNOSIS — M5136 Other intervertebral disc degeneration, lumbar region: Secondary | ICD-10-CM | POA: Diagnosis not present

## 2014-08-30 DIAGNOSIS — M9903 Segmental and somatic dysfunction of lumbar region: Secondary | ICD-10-CM | POA: Diagnosis not present

## 2014-08-30 DIAGNOSIS — M9902 Segmental and somatic dysfunction of thoracic region: Secondary | ICD-10-CM | POA: Diagnosis not present

## 2014-08-31 ENCOUNTER — Ambulatory Visit: Payer: Medicare Other | Admitting: Nurse Practitioner

## 2014-09-14 ENCOUNTER — Encounter: Payer: Self-pay | Admitting: Neurology

## 2014-09-14 ENCOUNTER — Ambulatory Visit (INDEPENDENT_AMBULATORY_CARE_PROVIDER_SITE_OTHER): Payer: Medicare Other | Admitting: Neurology

## 2014-09-14 VITALS — BP 105/68 | HR 68 | Ht 66.0 in | Wt 110.0 lb

## 2014-09-14 DIAGNOSIS — M542 Cervicalgia: Secondary | ICD-10-CM | POA: Diagnosis not present

## 2014-09-14 NOTE — Progress Notes (Signed)
PATIENT: Esmirna Ravan DOB: [birth year]  HISTORICAL  Tenecia Ignasiak is a 81 years old right-handed female, referred by her primary care physician PA Dionisio David for evaluation of bilateral ear discomfort.  She had a past medical history of hypertension, hyperlipidemia, had non-Hodgkin's lymphoma in 2005, received chemotherapy then, around that time, she developed intermittent left ear, left face discomfort, she described deep achy pain starting from her left ear, radiating towards her left forehead, left cheek area, over the years, she has multiple recurrent episode, increased frequency of the past couple years, she was always told that it was ear infection, each time she was treated with antibiotics, over the past few years, she has to take up to 4 courses of antibiotics yearly, each episodes last about one to 2 weeks, she denies shooting pain  Over the years, she noticed gradually fairly symmetric hearing loss, in the past couple years, associated with her left facial pain, she also noticed transient dizziness, vertigo,  In between that episode, she denies visual loss, she denies gait difficulty,  She was evaluated by ENT Jacolyn Reedy in May 2015, reported normal bilateral tympanic membrane,  audiogram showed a symmetric downsloping mild to moderate severe sensorineural hearing loss,   She has no left facial pain right now,   Laboratory evaluation showed normal CBC, C. reactive protein.  UPDATE July 15th 2015: She has not taken gabapentin yet, because she has no left facial pain recently.  We have reviewed MRI brain w/wo, showed mild gliosis, mild atrophy. I also talked with her daughter on phone.  UPDATE September 14 2014: She fell in Oct 2015, she had low BP, standing in line at grocery store, fell forward at right right frontal regions, with mild left frontal fracture,  she had forceful hyperextension of her neck, she then noticed right neck pain, radiating to her right  shoulder, tightnenss,  She also complained of vertigo, has vestibular rehab, which has helped, she had persistent neck pain, was recently evaluated by chiropractor, before proceeding to any treatment, she was referred for evaluation to rule out cervical pathology  She denies gait difficulty, no bowel and bladder incontinence, does has right-sided neck pain, sometimes radiating to right shoulder, she no longer has left facial pain, was diagnosed with left TMJ recently, her symptoms improved by at nighttime Dental bite  REVIEW OF SYSTEMS: Full 14 system review of systems performed and notable only for dizziness, neck pain, dry eye  ALLERGIES: Allergies  Allergen Reactions  . Biaxin [Clarithromycin] Other (See Comments)    Severe stomach burning  . Midodrine Other (See Comments)    Rash, headache, shakey  . Zocor [Simvastatin] Rash    Tolerating Lipitor  . Eliquis [Apixaban] Nausea Only and Other (See Comments)    Headaches    HOME MEDICATIONS: Current Outpatient Prescriptions on File Prior to Visit  Medication Sig Dispense Refill  . Atorvastatin Calcium (LIPITOR PO) Take by mouth daily.      . Escitalopram Oxalate (LEXAPRO PO) Take by mouth daily.      . Metoprolol Succinate (TOPROL XL PO) Take by mouth daily.      . Ramipril (ALTACE PO) Take by mouth.        PAST MEDICAL HISTORY: Past Medical History  Diagnosis Date  . HTN (hypertension)   . Osteoporosis   . TIA (transient ischemic attack)   . Mitral valve prolapse     a. Echo 01/2014: mild MVP.  Marland Kitchen Mitral regurgitation     a. Echo  01/2014: mod MR.  . Ascending aorta dilation     a. Echo 01/2014: mildly dilated.  . Abnormal TSH     a. Labs 01/2014:  low TSH 0.26 with free T4 1.07.  . Non-Hodgkin lymphoma     a. Tx with high dose chemotherapy at [facility] years ago. Pt reported  associated heart valve damage from chemotherapy.  . Abnormal ultrasound of carotid artery     a. 1-39% BICA 01/2014.  Marland Kitchen PAF (paroxysmal atrial fibrillation)      a. Dx 03/2014, spont converted to NSR, tx with low dose Toprol.  . Orthostatic hypotension   . Syncope     a.  syncope due to orthostatic hypotension with head injury/orbital fx 01/2014. Event monitor without arrhythmia at that time.  . Hyperlipidemia   . Anxiety   . PVC's (premature ventricular contractions)     a. Seen on tele 03/2014.  Marland Kitchen NSVT (nonsustained ventricular tachycardia)     a. Seen on tele 03/2014.  Marland Kitchen PAT (paroxysmal atrial tachycardia)     a. Seen on tele 03/2014.  Marland Kitchen Protein calorie malnutrition   . CKD (chronic kidney disease), stage II     PAST SURGICAL HISTORY: Past Surgical History  Procedure Laterality Date  . Cataract extraction    . Oral tooth     FAMILY HISTORY: Family History  Problem Relation Age of Onset  . Coronary artery disease Father 87  . Brain cancer Sister   . Unexplained death Mother 61    SOCIAL HISTORY:  History   Social History  . Marital Status: Single    Spouse Name: N/A    Number of Children: 22  . 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   Social History Main Topics  . Smoking status: Never Smoker   . Smokeless tobacco: Never Used  . Alcohol Use: No  . Drug Use: No  . Sexual Activity: Not on file   Other Topics Concern  . Not on file   Social History Narrative   Patient lives at home alone and she widowed.    Retired   Veterinary surgeon.   Right handed.   Caffeine - None    PHYSICAL EXAM   Filed Vitals:   09/14/14 1339  BP: 105/68  Pulse: 68  Height: 5\' 6"  (1.676 m)  Weight: 110 lb (49.896 kg)    Not recorded      Body mass index is 17.76 kg/(m^2).  PHYSICAL EXAMNIATION:  Gen: NAD, conversant, well nourised, obese, well groomed                     Cardiovascular: Regular rate rhythm, no peripheral edema, warm, nontender. Eyes: Conjunctivae clear without exudates or hemorrhage Neck: Supple, no carotid bruise. Pulmonary: Clear to  auscultation bilaterally   NEUROLOGICAL EXAM:  MENTAL STATUS: Speech:    Speech is normal; fluent and spontaneous with normal comprehension.  Cognition:    The patient is oriented to person, place, and time;     recent and remote memory intact;     language fluent;     normal attention, concentration,     fund of knowledge.  CRANIAL NERVES: CN II: Visual fields are full to confrontation. Fundoscopic exam is normal with sharp discs and no vascular changes. Venous pulsations are present bilaterally. Pupils are 4 mm and briskly reactive to light. Visual acuity is 20/20 bilaterally. CN III, IV, VI: extraocular movement  are normal. No ptosis. CN V: Facial sensation is intact to pinprick in all 3 divisions bilaterally. Corneal responses are intact.  CN VII: Face is symmetric with normal eye closure and smile. CN VIII: Hearing is normal to rubbing fingers CN IX, X: Palate elevates symmetrically. Phonation is normal. CN XI: Head turning and shoulder shrug are intact CN XII: Tongue is midline with normal movements and no atrophy.  MOTOR: There is no pronator drift of out-stretched arms. Muscle bulk and tone are normal. Muscle strength is normal.   Shoulder abduction Shoulder external rotation Elbow flexion Elbow extension Wrist flexion Wrist extension Finger abduction Hip flexion Knee flexion Knee extension Ankle dorsi flexion Ankle plantar flexion  R 5 5 5 5 5 5 5 5 5 5 5 5   L 5 5 5 5 5 5 5 5 5 5 5 5     REFLEXES: Reflexes are 3 and symmetric at the biceps, triceps, knees, and ankles. Plantar responses are flexor.  SENSORY: Light touch, pinprick, position sense, and vibration sense are intact in fingers and toes.  COORDINATION: Rapid alternating movements and fine finger movements are intact. There is no dysmetria on finger-to-nose and heel-knee-shin. There are no abnormal or extraneous movements.   GAIT/STANCE: Posture is normal. Gait is steady with normal steps, base, arm swing,  and turning. Heel and toe walking are normal. Tandem gait is normal.  Romberg is absent.     DIAGNOSTIC DATA (LABS, IMAGING, TESTING) - I reviewed patient records, labs, notes, testing and imaging myself where available.  Lab Results  Component Value Date   WBC 4.9 03/22/2014   HGB 14.5 03/22/2014   HCT 41.2 03/22/2014   MCV 94.5 03/22/2014   PLT 192 03/22/2014      Component Value Date/Time   NA 136* 03/23/2014 1445   K 4.4 03/23/2014 1445   CL 99 03/23/2014 1445   CO2 28 03/23/2014 1445   GLUCOSE 103* 03/23/2014 1445   BUN 10 03/23/2014 1445   CREATININE 0.86 03/23/2014 1445   CALCIUM 9.1 03/23/2014 1445   PROT 7.1 03/22/2014 1130   ALBUMIN 3.7 03/22/2014 1130   AST 28 03/22/2014 1130   ALT 11 03/22/2014 1130   ALKPHOS 83 03/22/2014 1130   BILITOT 0.7 03/22/2014 1130   GFRNONAA 64* 03/23/2014 1445   GFRAA 74* 03/23/2014 1445   ASSESSMENT AND PLAN  Kaesha Kirsch is a 81 y.o. female complains of  neck pain since hyperextension injury in October 2015, right side neck pain, radiating to right shoulder, hyperreflexia on examination.   Differentiation diagnosis including left cervical radiculopathy, need to rule out cervical spondylitic myelopathy MRI cervical   return to clinic in 2 weeks   Marcial Pacas, M.D. Ph.D.  [facility] [address] [place], [place] [address] [phone]

## 2014-09-20 ENCOUNTER — Ambulatory Visit
Admission: RE | Admit: 2014-09-20 | Discharge: 2014-09-20 | Disposition: A | Discharge status: Home / Self Care | Payer: Medicare Other | Source: Ambulatory Visit | Attending: Neurology | Admitting: Neurology

## 2014-09-20 DIAGNOSIS — M542 Cervicalgia: Secondary | ICD-10-CM | POA: Diagnosis not present

## 2014-09-20 MED ORDER — GADOBENATE DIMEGLUMINE 529 MG/ML IV SOLN: 15.0000 mL | Freq: Once | INTRAVENOUS | Status: AC | PRN

## 2014-09-22 ENCOUNTER — Telehealth: Payer: Self-pay | Admitting: Neurology

## 2014-09-22 NOTE — Telephone Encounter (Signed)
Patient aware of results and will keep follow up appt.

## 2014-09-22 NOTE — Telephone Encounter (Signed)
Michelle: Please call patient, MRI of cervical spine showed multilevel degenerative disc disease, no significant foraminal canal stenosis.  This is an abnormal MRI of the cervical spine showing multilevel degenerative changes as detailed above with various combinations of uncovertebral spurring, facet hypertrophy and disc bulging. There does not appear to be any nerve root impingement.

## 2014-09-27 ENCOUNTER — Ambulatory Visit (INDEPENDENT_AMBULATORY_CARE_PROVIDER_SITE_OTHER): Payer: Medicare Other | Admitting: Neurology

## 2014-09-27 ENCOUNTER — Encounter: Payer: Self-pay | Admitting: Neurology

## 2014-09-27 VITALS — BP 105/68 | HR 64 | Ht 66.0 in | Wt 109.0 lb

## 2014-09-27 DIAGNOSIS — M542 Cervicalgia: Secondary | ICD-10-CM | POA: Diagnosis not present

## 2014-09-27 DIAGNOSIS — M5481 Occipital neuralgia: Secondary | ICD-10-CM | POA: Diagnosis not present

## 2014-09-27 MED ORDER — BETAMETHASONE SOD PHOS & ACET 6 (3-3) MG/ML IJ SUSP
30.0000 mg | Freq: Once | INTRAMUSCULAR | Status: AC
  Administered 2014-09-27: 30 mg via INTRAMUSCULAR

## 2014-09-27 MED ORDER — BUPIVACAINE HCL (PF) 0.5 % IJ SOLN
10.0000 mL | Freq: Once | INTRAMUSCULAR | Status: AC
  Administered 2014-09-27: 10 mL

## 2014-09-27 MED ORDER — CELECOXIB 100 MG PO CAPS: 100.0000 mg | ORAL_CAPSULE | Freq: Two times a day (BID) | ORAL | Status: DC

## 2014-09-27 NOTE — Progress Notes (Signed)
PATIENT: [name] DOB: 1934/01/26  HISTORICAL  [name] is a 81 years old right-handed female, referred by her primary care physician PA Dionisio David for evaluation of bilateral ear discomfort.  She had a past medical history of hypertension, hyperlipidemia, had non-Hodgkin's lymphoma in 2005, received chemotherapy then, around that time, she developed intermittent left ear, left face discomfort, she described deep achy pain starting from her left ear, radiating towards her left forehead, left cheek area, over the years, she has multiple recurrent episode, increased frequency of the past couple years, she was always told that it was ear infection, each time she was treated with antibiotics, over the past few years, she has to take up to 4 courses of antibiotics yearly, each episodes last about one to 2 weeks, she denies shooting pain  Over the years, she noticed gradually fairly symmetric hearing loss, in the past couple years, associated with her left facial pain, she also noticed transient dizziness, vertigo,  In between that episode, she denies visual loss, she denies gait difficulty,  She was evaluated by ENT [name] in May 2015, reported normal bilateral tympanic membrane,  audiogram showed a symmetric downsloping mild to moderate severe sensorineural hearing loss,   She has no left facial pain right now,   Laboratory evaluation showed normal CBC, C. reactive protein.  UPDATE July 15th 2015: She has not taken gabapentin yet, because she has no left facial pain recently.  We have reviewed MRI brain w/wo, showed mild gliosis, mild atrophy. I also talked with her daughter on phone.  UPDATE September 14 2014: She fell in Oct 2015, she had low BP, standing in line at grocery store, fell forward at right frontal regions, with mild right frontal fracture,  she had forceful hyperextension of her neck, she then noticed right neck pain, radiating to her right shoulder,  tightnenss  She also complained of vertigo, has vestibular rehab, which has helped, she had persistent neck pain, was recently evaluated by chiropractor, before proceeding to any treatment, she was referred for evaluation to rule out cervical pathology  She denies gait difficulty, no bowel and bladder incontinence, does has right-sided neck pain, sometimes radiating to right shoulder, she no longer has left facial pain, was diagnosed with left TMJ recently, her symptoms improved by at nighttime dental bite  UPDATE April 27th 2016: I have reviewed with patient, MRI of the cervical spine showing multilevel degenerative changes as detailed above with various combinations of uncovertebral spurring, facet hypertrophy and disc bulging. There does not appear to be any nerve root impingement.  She continue have right-sided neck pain, radiating from right occipital to right shoulder, tenderness upon deep palpation, she has been practicing yoga regularly  She denies gait difficulty, no bowel and bladder incontinence.  REVIEW OF SYSTEMS: Full 14 system review of systems performed and notable only for joint pain ALLERGIES: Allergies  Allergen Reactions  . Biaxin [Clarithromycin] Other (See Comments)    Severe stomach burning  . Midodrine Other (See Comments)    Rash, headache, shakey  . Zocor [Simvastatin] Rash    Tolerating Lipitor  . Eliquis [Apixaban] Nausea Only and Other (See Comments)    Headaches    HOME MEDICATIONS: Current Outpatient Prescriptions on File Prior to Visit  Medication Sig Dispense Refill  . Atorvastatin Calcium (LIPITOR PO) Take by mouth daily.      . Escitalopram Oxalate (LEXAPRO PO) Take by mouth daily.      . Metoprolol Succinate (TOPROL XL PO)  Take by mouth daily.      . Ramipril (ALTACE PO) Take by mouth.        PAST MEDICAL HISTORY: Past Medical History  Diagnosis Date  . HTN (hypertension)   . Osteoporosis   . TIA (transient ischemic attack)   . Mitral  valve prolapse     a. Echo 01/2014: mild MVP.  Marland Kitchen Mitral regurgitation     a. Echo 01/2014: mod MR.  . Ascending aorta dilation     a. Echo 01/2014: mildly dilated.  . Abnormal TSH     a. Labs 01/2014:  low TSH 0.26 with free T4 1.07.  . Non-Hodgkin lymphoma     a. Tx with high dose chemotherapy at [facility] years ago. Pt reported  associated heart valve damage from chemotherapy.  . Abnormal ultrasound of carotid artery     a. 1-39% BICA 01/2014.  Marland Kitchen PAF (paroxysmal atrial fibrillation)     a. Dx 03/2014, spont converted to NSR, tx with low dose Toprol.  . Orthostatic hypotension   . Syncope     a.  syncope due to orthostatic hypotension with head injury/orbital fx 01/2014. Event monitor without arrhythmia at that time.  . Hyperlipidemia   . Anxiety   . PVC's (premature ventricular contractions)     a. Seen on tele 03/2014.  Marland Kitchen NSVT (nonsustained ventricular tachycardia)     a. Seen on tele 03/2014.  Marland Kitchen PAT (paroxysmal atrial tachycardia)     a. Seen on tele 03/2014.  Marland Kitchen Protein calorie malnutrition   . CKD (chronic kidney disease), stage II     PAST SURGICAL HISTORY: Past Surgical History  Procedure Laterality Date  . Cataract extraction    . Oral tooth     FAMILY HISTORY: Family History  Problem Relation Age of Onset  . Coronary artery disease Father 52  . Brain cancer Sister   . Unexplained death Mother 58    SOCIAL HISTORY:  History   Social History  . Marital Status: Single    Spouse Name: N/A    Number of Children: 65  . 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   Social History Main Topics  . Smoking status: Never Smoker   . Smokeless tobacco: Never Used  . Alcohol Use: No  . Drug Use: No  . Sexual Activity: Not on file   Other Topics Concern  . Not on file   Social History Narrative   Patient lives at home alone and she widowed.    Retired   Veterinary surgeon.   Right handed.   Caffeine - None     PHYSICAL EXAM   Filed Vitals:   09/27/14 1003  BP: 105/68  Pulse: 64  Height: 5\' 6"  (1.676 m)  Weight: 109 lb (49.442 kg)    Not recorded      Body mass index is 17.6 kg/(m^2).  PHYSICAL EXAMNIATION:  Gen: NAD, conversant, well nourised, obese, well groomed                     Cardiovascular: Regular rate rhythm, no peripheral edema, warm, nontender. Eyes: Conjunctivae clear without exudates or hemorrhage Neck: Supple, no carotid bruise. Pulmonary: Clear to auscultation bilaterally  Musculoskeletal: Tenderness along right nuchal line, Right upper trapezius, right cervical paraspinal muscles  NEUROLOGICAL EXAM:  MENTAL STATUS: Speech:    Speech is normal; fluent and spontaneous with normal comprehension.  Cognition:  The patient is oriented to person, place, and time;     recent and remote memory intact;     language fluent;     normal attention, concentration,     fund of knowledge.  CRANIAL NERVES: CN II: Visual fields are full to confrontation. Fundoscopic exam is normal with sharp discs and no vascular changes. Venous pulsations are present bilaterally. Pupils are 4 mm and briskly reactive to light. Visual acuity is 20/20 bilaterally. CN III, IV, VI: extraocular movement are normal. No ptosis. CN V: Facial sensation is intact to pinprick in all 3 divisions bilaterally. Corneal responses are intact.  CN VII: Face is symmetric with normal eye closure and smile. CN VIII: Hearing is normal to rubbing fingers CN IX, X: Palate elevates symmetrically. Phonation is normal. CN XI: Head turning and shoulder shrug are intact CN XII: Tongue is midline with normal movements and no atrophy.  MOTOR: There is no pronator drift of out-stretched arms. Muscle bulk and tone are normal. Muscle strength is normal.   Shoulder abduction Shoulder external rotation Elbow flexion Elbow extension Wrist flexion Wrist extension Finger abduction Hip flexion Knee flexion Knee extension  Ankle dorsi flexion Ankle plantar flexion  R 5 5 5 5 5 5 5 5 5 5 5 5   L 5 5 5 5 5 5 5 5 5 5 5 5     REFLEXES: Reflexes are 3 and symmetric at the biceps, triceps, knees, and ankles. Plantar responses are flexor.  SENSORY: Light touch, pinprick, position sense, and vibration sense are intact in fingers and toes.  COORDINATION: Rapid alternating movements and fine finger movements are intact. There is no dysmetria on finger-to-nose and heel-knee-shin. There are no abnormal or extraneous movements.   GAIT/STANCE: Posture is normal. Gait is steady with normal steps, base, arm swing, and turning. Heel and toe walking are normal. Tandem gait is normal.  Romberg is absent.     DIAGNOSTIC DATA (LABS, IMAGING, TESTING) - I reviewed patient records, labs, notes, testing and imaging myself where available.  Lab Results  Component Value Date   WBC 4.9 03/22/2014   HGB 14.5 03/22/2014   HCT 41.2 03/22/2014   MCV 94.5 03/22/2014   PLT 192 03/22/2014      Component Value Date/Time   NA 136* 03/23/2014 1445   K 4.4 03/23/2014 1445   CL 99 03/23/2014 1445   CO2 28 03/23/2014 1445   GLUCOSE 103* 03/23/2014 1445   BUN 10 03/23/2014 1445   CREATININE 0.86 03/23/2014 1445   CALCIUM 9.1 03/23/2014 1445   PROT 7.1 03/22/2014 1130   ALBUMIN 3.7 03/22/2014 1130   AST 28 03/22/2014 1130   ALT 11 03/22/2014 1130   ALKPHOS 83 03/22/2014 1130   BILITOT 0.7 03/22/2014 1130   GFRNONAA 64* 03/23/2014 1445   GFRAA 74* 03/23/2014 1445   ASSESSMENT AND PLAN  Mckenlee Mangham is a 82 y.o. female complains of  neck pain since hyperextension injury in October 2015, right side neck pain, radiating to right shoulder, tenderness of right occipital nuchal line, right upper trapezius upon deep palpitation, c/w with right occipital neuralgia.  1, Celebrex as needed 2, trigger point injection today.  2 cc of 0.5 percent bupivacaine was mixed with 2 cc of betamethasone(30 mg/5 ML) at 1:1 ratio for total of 4  cc mix solution  Injection was placed along right nuchal line, 3 injection sites for total of 2 cc, also placed injection at right upper Trapezius 0.5 cc, right cervical paraspinals 1.5 cc  at 2 different injection sites.  Patient tolerate the injection well, she will return to clinic in 3 months for repeat injection  Marcial Pacas, M.D. Ph.D.  [facility] [address] [place], [place] [address] [phone]

## 2014-10-02 ENCOUNTER — Ambulatory Visit: Payer: Medicare Other | Admitting: Neurology

## 2014-10-06 ENCOUNTER — Telehealth: Payer: Self-pay | Admitting: Cardiology

## 2014-10-06 ENCOUNTER — Other Ambulatory Visit: Payer: Self-pay | Admitting: *Deleted

## 2014-10-06 MED ORDER — ATORVASTATIN CALCIUM 10 MG PO TABS: 10.0000 mg | ORAL_TABLET | Freq: Every day | ORAL | Status: AC

## 2014-10-06 MED ORDER — MAGNESIUM OXIDE 400 (241.3 MG) MG PO TABS: 400.0000 mg | ORAL_TABLET | Freq: Every day | ORAL | Status: DC

## 2014-10-06 MED ORDER — RIVAROXABAN 15 MG PO TABS: 15.0000 mg | ORAL_TABLET | Freq: Every day | ORAL | Status: AC

## 2014-10-06 NOTE — Telephone Encounter (Signed)
°  1. Which medications need to be refilled? Atorvastatin, Xarelto  2. Which pharmacy is medication to be sent to?[facility] on [address](lipitor), [facility]  3. Do they need a 30 day or 90 day supply? 30 and 90  4. Would they like a call back once the medication has been sent to the pharmacy? Yes

## 2014-10-11 DIAGNOSIS — M542 Cervicalgia: Secondary | ICD-10-CM | POA: Diagnosis not present

## 2014-10-11 DIAGNOSIS — M9901 Segmental and somatic dysfunction of cervical region: Secondary | ICD-10-CM | POA: Diagnosis not present

## 2014-10-11 DIAGNOSIS — M9902 Segmental and somatic dysfunction of thoracic region: Secondary | ICD-10-CM | POA: Diagnosis not present

## 2014-10-11 DIAGNOSIS — M9903 Segmental and somatic dysfunction of lumbar region: Secondary | ICD-10-CM | POA: Diagnosis not present

## 2014-10-11 DIAGNOSIS — M546 Pain in thoracic spine: Secondary | ICD-10-CM | POA: Diagnosis not present

## 2014-10-11 DIAGNOSIS — M545 Low back pain: Secondary | ICD-10-CM | POA: Diagnosis not present

## 2014-10-13 DIAGNOSIS — M9902 Segmental and somatic dysfunction of thoracic region: Secondary | ICD-10-CM | POA: Diagnosis not present

## 2014-10-13 DIAGNOSIS — M542 Cervicalgia: Secondary | ICD-10-CM | POA: Diagnosis not present

## 2014-10-13 DIAGNOSIS — M546 Pain in thoracic spine: Secondary | ICD-10-CM | POA: Diagnosis not present

## 2014-10-13 DIAGNOSIS — M9901 Segmental and somatic dysfunction of cervical region: Secondary | ICD-10-CM | POA: Diagnosis not present

## 2014-10-13 DIAGNOSIS — M9903 Segmental and somatic dysfunction of lumbar region: Secondary | ICD-10-CM | POA: Diagnosis not present

## 2014-10-13 DIAGNOSIS — M545 Low back pain: Secondary | ICD-10-CM | POA: Diagnosis not present

## 2014-10-17 DIAGNOSIS — M9902 Segmental and somatic dysfunction of thoracic region: Secondary | ICD-10-CM | POA: Diagnosis not present

## 2014-10-17 DIAGNOSIS — M546 Pain in thoracic spine: Secondary | ICD-10-CM | POA: Diagnosis not present

## 2014-10-17 DIAGNOSIS — M9903 Segmental and somatic dysfunction of lumbar region: Secondary | ICD-10-CM | POA: Diagnosis not present

## 2014-10-17 DIAGNOSIS — M542 Cervicalgia: Secondary | ICD-10-CM | POA: Diagnosis not present

## 2014-10-17 DIAGNOSIS — M545 Low back pain: Secondary | ICD-10-CM | POA: Diagnosis not present

## 2014-10-17 DIAGNOSIS — M9901 Segmental and somatic dysfunction of cervical region: Secondary | ICD-10-CM | POA: Diagnosis not present

## 2014-10-19 DIAGNOSIS — H04123 Dry eye syndrome of bilateral lacrimal glands: Secondary | ICD-10-CM | POA: Diagnosis not present

## 2014-10-19 DIAGNOSIS — H5712 Ocular pain, left eye: Secondary | ICD-10-CM | POA: Diagnosis not present

## 2014-10-19 DIAGNOSIS — H26493 Other secondary cataract, bilateral: Secondary | ICD-10-CM | POA: Diagnosis not present

## 2014-10-23 DIAGNOSIS — M542 Cervicalgia: Secondary | ICD-10-CM | POA: Diagnosis not present

## 2014-10-23 DIAGNOSIS — M546 Pain in thoracic spine: Secondary | ICD-10-CM | POA: Diagnosis not present

## 2014-10-23 DIAGNOSIS — M9902 Segmental and somatic dysfunction of thoracic region: Secondary | ICD-10-CM | POA: Diagnosis not present

## 2014-10-23 DIAGNOSIS — M545 Low back pain: Secondary | ICD-10-CM | POA: Diagnosis not present

## 2014-10-23 DIAGNOSIS — M9901 Segmental and somatic dysfunction of cervical region: Secondary | ICD-10-CM | POA: Diagnosis not present

## 2014-10-23 DIAGNOSIS — M9903 Segmental and somatic dysfunction of lumbar region: Secondary | ICD-10-CM | POA: Diagnosis not present

## 2014-10-26 DIAGNOSIS — M546 Pain in thoracic spine: Secondary | ICD-10-CM | POA: Diagnosis not present

## 2014-10-26 DIAGNOSIS — M545 Low back pain: Secondary | ICD-10-CM | POA: Diagnosis not present

## 2014-10-26 DIAGNOSIS — M9901 Segmental and somatic dysfunction of cervical region: Secondary | ICD-10-CM | POA: Diagnosis not present

## 2014-10-26 DIAGNOSIS — M9903 Segmental and somatic dysfunction of lumbar region: Secondary | ICD-10-CM | POA: Diagnosis not present

## 2014-10-26 DIAGNOSIS — M542 Cervicalgia: Secondary | ICD-10-CM | POA: Diagnosis not present

## 2014-10-26 DIAGNOSIS — M9902 Segmental and somatic dysfunction of thoracic region: Secondary | ICD-10-CM | POA: Diagnosis not present

## 2014-10-31 DIAGNOSIS — M545 Low back pain: Secondary | ICD-10-CM | POA: Diagnosis not present

## 2014-10-31 DIAGNOSIS — M9902 Segmental and somatic dysfunction of thoracic region: Secondary | ICD-10-CM | POA: Diagnosis not present

## 2014-10-31 DIAGNOSIS — M542 Cervicalgia: Secondary | ICD-10-CM | POA: Diagnosis not present

## 2014-10-31 DIAGNOSIS — M9901 Segmental and somatic dysfunction of cervical region: Secondary | ICD-10-CM | POA: Diagnosis not present

## 2014-10-31 DIAGNOSIS — M9903 Segmental and somatic dysfunction of lumbar region: Secondary | ICD-10-CM | POA: Diagnosis not present

## 2014-10-31 DIAGNOSIS — M546 Pain in thoracic spine: Secondary | ICD-10-CM | POA: Diagnosis not present

## 2014-11-06 DIAGNOSIS — M546 Pain in thoracic spine: Secondary | ICD-10-CM | POA: Diagnosis not present

## 2014-11-06 DIAGNOSIS — M545 Low back pain: Secondary | ICD-10-CM | POA: Diagnosis not present

## 2014-11-06 DIAGNOSIS — M9903 Segmental and somatic dysfunction of lumbar region: Secondary | ICD-10-CM | POA: Diagnosis not present

## 2014-11-06 DIAGNOSIS — M542 Cervicalgia: Secondary | ICD-10-CM | POA: Diagnosis not present

## 2014-11-06 DIAGNOSIS — M9901 Segmental and somatic dysfunction of cervical region: Secondary | ICD-10-CM | POA: Diagnosis not present

## 2014-11-06 DIAGNOSIS — M9902 Segmental and somatic dysfunction of thoracic region: Secondary | ICD-10-CM | POA: Diagnosis not present

## 2014-11-15 DIAGNOSIS — M9902 Segmental and somatic dysfunction of thoracic region: Secondary | ICD-10-CM | POA: Diagnosis not present

## 2014-11-15 DIAGNOSIS — M9901 Segmental and somatic dysfunction of cervical region: Secondary | ICD-10-CM | POA: Diagnosis not present

## 2014-11-15 DIAGNOSIS — M542 Cervicalgia: Secondary | ICD-10-CM | POA: Diagnosis not present

## 2014-11-15 DIAGNOSIS — M9903 Segmental and somatic dysfunction of lumbar region: Secondary | ICD-10-CM | POA: Diagnosis not present

## 2014-11-15 DIAGNOSIS — M545 Low back pain: Secondary | ICD-10-CM | POA: Diagnosis not present

## 2014-11-15 DIAGNOSIS — M546 Pain in thoracic spine: Secondary | ICD-10-CM | POA: Diagnosis not present

## 2014-11-20 DIAGNOSIS — M546 Pain in thoracic spine: Secondary | ICD-10-CM | POA: Diagnosis not present

## 2014-11-20 DIAGNOSIS — M9903 Segmental and somatic dysfunction of lumbar region: Secondary | ICD-10-CM | POA: Diagnosis not present

## 2014-11-20 DIAGNOSIS — M542 Cervicalgia: Secondary | ICD-10-CM | POA: Diagnosis not present

## 2014-11-20 DIAGNOSIS — M9901 Segmental and somatic dysfunction of cervical region: Secondary | ICD-10-CM | POA: Diagnosis not present

## 2014-11-20 DIAGNOSIS — M9902 Segmental and somatic dysfunction of thoracic region: Secondary | ICD-10-CM | POA: Diagnosis not present

## 2014-11-20 DIAGNOSIS — M545 Low back pain: Secondary | ICD-10-CM | POA: Diagnosis not present

## 2014-11-27 DIAGNOSIS — M9901 Segmental and somatic dysfunction of cervical region: Secondary | ICD-10-CM | POA: Diagnosis not present

## 2014-11-27 DIAGNOSIS — M545 Low back pain: Secondary | ICD-10-CM | POA: Diagnosis not present

## 2014-11-27 DIAGNOSIS — M9903 Segmental and somatic dysfunction of lumbar region: Secondary | ICD-10-CM | POA: Diagnosis not present

## 2014-11-27 DIAGNOSIS — M542 Cervicalgia: Secondary | ICD-10-CM | POA: Diagnosis not present

## 2014-11-27 DIAGNOSIS — M546 Pain in thoracic spine: Secondary | ICD-10-CM | POA: Diagnosis not present

## 2014-11-27 DIAGNOSIS — M9902 Segmental and somatic dysfunction of thoracic region: Secondary | ICD-10-CM | POA: Diagnosis not present

## 2014-12-01 DIAGNOSIS — C44319 Basal cell carcinoma of skin of other parts of face: Secondary | ICD-10-CM | POA: Diagnosis not present

## 2014-12-01 DIAGNOSIS — L821 Other seborrheic keratosis: Secondary | ICD-10-CM | POA: Diagnosis not present

## 2014-12-01 DIAGNOSIS — Z85828 Personal history of other malignant neoplasm of skin: Secondary | ICD-10-CM | POA: Diagnosis not present

## 2014-12-01 DIAGNOSIS — D485 Neoplasm of uncertain behavior of skin: Secondary | ICD-10-CM | POA: Diagnosis not present

## 2014-12-01 DIAGNOSIS — L570 Actinic keratosis: Secondary | ICD-10-CM | POA: Diagnosis not present

## 2014-12-01 DIAGNOSIS — L720 Epidermal cyst: Secondary | ICD-10-CM | POA: Diagnosis not present

## 2014-12-05 DIAGNOSIS — M9901 Segmental and somatic dysfunction of cervical region: Secondary | ICD-10-CM | POA: Diagnosis not present

## 2014-12-05 DIAGNOSIS — M9903 Segmental and somatic dysfunction of lumbar region: Secondary | ICD-10-CM | POA: Diagnosis not present

## 2014-12-05 DIAGNOSIS — M542 Cervicalgia: Secondary | ICD-10-CM | POA: Diagnosis not present

## 2014-12-05 DIAGNOSIS — M545 Low back pain: Secondary | ICD-10-CM | POA: Diagnosis not present

## 2014-12-05 DIAGNOSIS — M9902 Segmental and somatic dysfunction of thoracic region: Secondary | ICD-10-CM | POA: Diagnosis not present

## 2014-12-05 DIAGNOSIS — M546 Pain in thoracic spine: Secondary | ICD-10-CM | POA: Diagnosis not present

## 2014-12-11 ENCOUNTER — Ambulatory Visit: Payer: Medicare Other | Admitting: Cardiology

## 2014-12-13 ENCOUNTER — Ambulatory Visit (INDEPENDENT_AMBULATORY_CARE_PROVIDER_SITE_OTHER): Payer: Medicare Other | Admitting: Internal Medicine

## 2014-12-13 ENCOUNTER — Encounter: Payer: Self-pay | Admitting: Internal Medicine

## 2014-12-13 VITALS — BP 110/70 | Temp 97.7°F | Ht 66.0 in | Wt 111.8 lb

## 2014-12-13 DIAGNOSIS — M542 Cervicalgia: Secondary | ICD-10-CM | POA: Diagnosis not present

## 2014-12-13 DIAGNOSIS — R42 Dizziness and giddiness: Secondary | ICD-10-CM | POA: Diagnosis not present

## 2014-12-13 DIAGNOSIS — I10 Essential (primary) hypertension: Secondary | ICD-10-CM

## 2014-12-13 NOTE — Assessment & Plan Note (Signed)
Her symptoms likely secondary to chronic spondylosis.  Avoid massage therapy for now.

## 2014-12-13 NOTE — Assessment & Plan Note (Addendum)
Patient reports sporadic elevated blood pressure readings at home. She has old manual BP cuff. Obtain new automated cuff and continue to monitor at home. Reassess in 4 weeks.  BP: 110/70 mmHg

## 2014-12-13 NOTE — Progress Notes (Signed)
Subjective:    Patient ID: [name], female    DOB: 1933/10/03, 81 y.o.   MRN: [id number]  HPI  81 year old white female with history of TIA, paroxysmal atrial fibrillation on anticoagulation and hypertension presents with elevated BP readings at home and intermittent dizziness. Patient reports she has had issues with intermittent vertigo starting 5 months ago (attributed to BPV).  Over the past 2 weeks, intermittent dizziness has gotten slightly worse. Patient reports her dizziness initially improved after performing vertigo exercises. She has been working with a massage therapist for neck pain. She noticed that her blood pressure increased after a massage therapist worked on her cervical spine. Home systolic blood pressure readings between 140 and 158.  She denies any irregular heartbeat.  She has been taking metoprolol 25 mg really. She rarely misses a dose.  Review of Systems Negative for chest pain.  No gait problems.  Occasional mild frontal headache  Previous MRI of bran and C spine reviewed.    Past Medical History  Diagnosis Date  . HTN (hypertension)   . Osteoporosis   . TIA (transient ischemic attack)   . Mitral valve prolapse     a. Echo 01/2014: mild MVP.  Marland Kitchen Mitral regurgitation     a. Echo 01/2014: mod MR.  . Ascending aorta dilation     a. Echo 01/2014: mildly dilated.  . Abnormal TSH     a. Labs 01/2014:  low TSH 0.26 with free T4 1.07.  . Non-Hodgkin lymphoma     a. Tx with high dose chemotherapy at [facility] years ago. Pt reported  associated heart valve damage from chemotherapy.  . Abnormal ultrasound of carotid artery     a. 1-39% BICA 01/2014.  Marland Kitchen PAF (paroxysmal atrial fibrillation)     a. Dx 03/2014, spont converted to NSR, tx with low dose Toprol.  . Orthostatic hypotension   . Syncope     a.  syncope due to orthostatic hypotension with head injury/orbital fx 01/2014. Event monitor without arrhythmia at that time.  . Hyperlipidemia   . Anxiety   . PVC's  (premature ventricular contractions)     a. Seen on tele 03/2014.  Marland Kitchen NSVT (nonsustained ventricular tachycardia)     a. Seen on tele 03/2014.  Marland Kitchen PAT (paroxysmal atrial tachycardia)     a. Seen on tele 03/2014.  Marland Kitchen Protein calorie malnutrition   . CKD (chronic kidney disease), stage II     History   Social History  . Marital Status: Widowed    Spouse Name: N/A  . Number of Children: 79  . Years of Education: college   Occupational History  .      Retired   Social History Main Topics  . Smoking status: Never Smoker   . Smokeless tobacco: Never Used  . Alcohol Use: No  . Drug Use: No  . Sexual Activity: Not on file   Other Topics Concern  . Not on file   Social History Narrative   Patient lives with daughter when she is in [place]   She is widowed. Husband passed away 45 yrs ago.   Retired   Veterinary surgeon.   Right handed.   Caffeine - None    Past Surgical History  Procedure Laterality Date  . Cataract extraction    . Oral tooth      Family History  Problem Relation Age of Onset  . Coronary artery disease Father 49  . Brain cancer Sister   . Unexplained death Mother  42    Allergies  Allergen Reactions  . Biaxin [Clarithromycin] Other (See Comments)    Severe stomach burning  . Midodrine Other (See Comments)    Rash, headache, shakey  . Zocor [Simvastatin] Rash    Tolerating Lipitor  . Eliquis [Apixaban] Nausea Only and Other (See Comments)    Headaches    Current Outpatient Prescriptions on File Prior to Visit  Medication Sig Dispense Refill  . atorvastatin (LIPITOR) 10 MG tablet Take 1 tablet (10 mg total) by mouth daily. 30 tablet 9  . magnesium oxide (MAG-OX) 400 (241.3 MG) MG tablet Take 1 tablet (400 mg total) by mouth daily. 90 tablet 0  . metoprolol succinate (TOPROL XL) 25 MG 24 hr tablet Take 1 tablet (25 mg total) by mouth daily. 15 tablet 0  . Polyethyl Glycol-Propyl Glycol (SYSTANE OP) Place 1 drop into both eyes 3 (three) times  daily as needed (dry eyes).    . Rivaroxaban (XARELTO) 15 MG TABS tablet Take 1 tablet (15 mg total) by mouth daily with supper. 90 tablet 3  . celecoxib (CELEBREX) 100 MG capsule Take 1 capsule (100 mg total) by mouth 2 (two) times daily. (Patient not taking: Reported on 12/13/2014) 60 capsule 5   No current facility-administered medications on file prior to visit.    BP 110/70 mmHg  Temp(Src) 97.7 F (36.5 C) (Oral)  Ht 5\' 6"  (1.676 m)  Wt 111 lb 12.8 oz (50.712 kg)  BMI 18.05 kg/m2    Objective:   Physical Exam  Constitutional: She is oriented to person, place, and time. No distress.  HENT:  Head: Normocephalic and atraumatic.  Right Ear: External ear normal.  Left Ear: External ear normal.  Mouth/Throat: Oropharynx is clear and moist.  Eyes: Conjunctivae and EOM are normal. Pupils are equal, round, and reactive to light.  Negative for nystagmus  Neck: Neck supple.  Cardiovascular: Normal rate, regular rhythm and normal heart sounds.  Exam reveals no friction rub.   Pulmonary/Chest: Effort normal and breath sounds normal. She has no wheezes.  Lymphadenopathy:    She has no cervical adenopathy.  Neurological: She is alert and oriented to person, place, and time. She has normal reflexes. No cranial nerve deficit.  Negative for ataxic gait Negative for dysmetria Negative for pronator drift Negative Romberg  Skin: Skin is warm and dry.  Psychiatric: She has a normal mood and affect. Her behavior is normal.          Assessment & Plan:

## 2014-12-13 NOTE — Patient Instructions (Signed)
Monitor you blood pressure at home as directed Obtain new automated BP cuff Contact our office if your dizziness gets worse

## 2014-12-13 NOTE — Assessment & Plan Note (Signed)
I doubt patient's dizziness related to cardiac arrhythmia. I suspect her symptoms related to benign positional vertigo. Continue to monitor for now.

## 2014-12-14 DIAGNOSIS — M545 Low back pain: Secondary | ICD-10-CM | POA: Diagnosis not present

## 2014-12-14 DIAGNOSIS — M9901 Segmental and somatic dysfunction of cervical region: Secondary | ICD-10-CM | POA: Diagnosis not present

## 2014-12-14 DIAGNOSIS — M9903 Segmental and somatic dysfunction of lumbar region: Secondary | ICD-10-CM | POA: Diagnosis not present

## 2014-12-14 DIAGNOSIS — M542 Cervicalgia: Secondary | ICD-10-CM | POA: Diagnosis not present

## 2014-12-14 DIAGNOSIS — M546 Pain in thoracic spine: Secondary | ICD-10-CM | POA: Diagnosis not present

## 2014-12-14 DIAGNOSIS — M9902 Segmental and somatic dysfunction of thoracic region: Secondary | ICD-10-CM | POA: Diagnosis not present

## 2014-12-19 DIAGNOSIS — M9901 Segmental and somatic dysfunction of cervical region: Secondary | ICD-10-CM | POA: Diagnosis not present

## 2014-12-19 DIAGNOSIS — M9903 Segmental and somatic dysfunction of lumbar region: Secondary | ICD-10-CM | POA: Diagnosis not present

## 2014-12-19 DIAGNOSIS — M542 Cervicalgia: Secondary | ICD-10-CM | POA: Diagnosis not present

## 2014-12-19 DIAGNOSIS — M546 Pain in thoracic spine: Secondary | ICD-10-CM | POA: Diagnosis not present

## 2014-12-19 DIAGNOSIS — M545 Low back pain: Secondary | ICD-10-CM | POA: Diagnosis not present

## 2014-12-19 DIAGNOSIS — M9902 Segmental and somatic dysfunction of thoracic region: Secondary | ICD-10-CM | POA: Diagnosis not present

## 2014-12-21 DIAGNOSIS — M9901 Segmental and somatic dysfunction of cervical region: Secondary | ICD-10-CM | POA: Diagnosis not present

## 2014-12-21 DIAGNOSIS — M9903 Segmental and somatic dysfunction of lumbar region: Secondary | ICD-10-CM | POA: Diagnosis not present

## 2014-12-21 DIAGNOSIS — M9902 Segmental and somatic dysfunction of thoracic region: Secondary | ICD-10-CM | POA: Diagnosis not present

## 2014-12-21 DIAGNOSIS — M546 Pain in thoracic spine: Secondary | ICD-10-CM | POA: Diagnosis not present

## 2014-12-21 DIAGNOSIS — M545 Low back pain: Secondary | ICD-10-CM | POA: Diagnosis not present

## 2014-12-21 DIAGNOSIS — M542 Cervicalgia: Secondary | ICD-10-CM | POA: Diagnosis not present

## 2014-12-27 ENCOUNTER — Ambulatory Visit (INDEPENDENT_AMBULATORY_CARE_PROVIDER_SITE_OTHER): Payer: Medicare Other | Admitting: Neurology

## 2014-12-27 ENCOUNTER — Encounter: Payer: Self-pay | Admitting: Neurology

## 2014-12-27 VITALS — BP 100/72 | HR 61 | Ht 66.0 in | Wt 111.0 lb

## 2014-12-27 DIAGNOSIS — M542 Cervicalgia: Secondary | ICD-10-CM | POA: Diagnosis not present

## 2014-12-27 NOTE — Progress Notes (Signed)
Chief Complaint  Patient presents with  . Neck Pain    Says her neck is still a little tight but she no longer has any pain.  She going to a chiropractor once weekly which has been beneficial.      PATIENT: [name] DOB: Nov 12, 1933  HISTORICAL  [name] is a 81 years old right-handed female, referred by her primary care physician PA Dionisio David for evaluation of bilateral ear discomfort.  She had a past medical history of hypertension, hyperlipidemia, had non-Hodgkin's lymphoma in 2005, received chemotherapy then, around that time, she developed intermittent left ear, left face discomfort, she described deep achy pain starting from her left ear, radiating towards her left forehead, left cheek area, over the years, she has multiple recurrent episode, increased frequency of the past couple years, she was always told that it was ear infection, each time she was treated with antibiotics, over the past few years, she has to take up to 4 courses of antibiotics yearly, each episodes last about one to 2 weeks, she denies shooting pain  Over the years, she noticed gradually fairly symmetric hearing loss, in the past couple years, associated with her left facial pain, she also noticed transient dizziness, vertigo,  In between that episode, she denies visual loss, she denies gait difficulty,  She was evaluated by ENT Jacolyn Reedy in May 2015, reported normal bilateral tympanic membrane,  audiogram showed a symmetric downsloping mild to moderate severe sensorineural hearing loss,   She has no left facial pain right now,   Laboratory evaluation showed normal CBC, C. reactive protein.  UPDATE July 15th 2015: She has not taken gabapentin yet, because she has no left facial pain recently.  We have reviewed MRI brain w/wo, showed mild gliosis, mild atrophy. I also talked with her daughter on phone.  UPDATE September 14 2014: She fell in Oct 2015, she had low BP, standing in line at  grocery store, fell forward at right frontal regions, with mild right frontal fracture,  she had forceful hyperextension of her neck, she then noticed right neck pain, radiating to her right shoulder, tightnenss  She also complained of vertigo, has vestibular rehab, which has helped, she had persistent neck pain, was recently evaluated by chiropractor, before proceeding to any treatment, she was referred for evaluation to rule out cervical pathology  She denies gait difficulty, no bowel and bladder incontinence, does has right-sided neck pain, sometimes radiating to right shoulder, she no longer has left facial pain, was diagnosed with left TMJ recently, her symptoms improved by at nighttime dental bite  UPDATE April 27th 2016: I have reviewed with patient, MRI of the cervical spine showing multilevel degenerative changes with various combinations of uncovertebral spurring, facet hypertrophy and disc bulging. There does not appear to be any nerve root impingement.  She continue have right-sided neck pain, radiating from right occipital to right shoulder, tenderness upon deep palpation, she has been practicing yoga regularly  She denies gait difficulty, no bowel and bladder incontinence.  UPDATE July 27th 2016; Since last trigger point injection along right nuchal line in April 2016, her right neck pain has much improved, she occasionally feel muscle spasm, receiving chiropractor adjustment, she no longer had left jaw or left facial pain, no gait difficulty  Review of systems: 12 review of system was performed, and relevant is documented above ALLERGIES: Allergies  Allergen Reactions  . Biaxin [Clarithromycin] Other (See Comments)    Severe stomach burning  . Midodrine Other (See Comments)  Rash, headache, shakey  . Zocor [Simvastatin] Rash    Tolerating Lipitor  . Celebrex [Celecoxib] Other (See Comments)    Develops double vision.  . Eliquis [Apixaban] Nausea Only and Other (See  Comments)    Headaches    HOME MEDICATIONS: Current Outpatient Prescriptions on File Prior to Visit  Medication Sig Dispense Refill  . Atorvastatin Calcium (LIPITOR PO) Take by mouth daily.      . Escitalopram Oxalate (LEXAPRO PO) Take by mouth daily.      . Metoprolol Succinate (TOPROL XL PO) Take by mouth daily.      . Ramipril (ALTACE PO) Take by mouth.        PAST MEDICAL HISTORY: Past Medical History  Diagnosis Date  . HTN (hypertension)   . Osteoporosis   . TIA (transient ischemic attack)   . Mitral valve prolapse     a. Echo 01/2014: mild MVP.  Marland Kitchen Mitral regurgitation     a. Echo 01/2014: mod MR.  . Ascending aorta dilation     a. Echo 01/2014: mildly dilated.  . Abnormal TSH     a. Labs 01/2014:  low TSH 0.26 with free T4 1.07.  . Non-Hodgkin lymphoma     a. Tx with high dose chemotherapy at [facility] years ago. Pt reported  associated heart valve damage from chemotherapy.  . Abnormal ultrasound of carotid artery     a. 1-39% BICA 01/2014.  Marland Kitchen PAF (paroxysmal atrial fibrillation)     a. Dx 03/2014, spont converted to NSR, tx with low dose Toprol.  . Orthostatic hypotension   . Syncope     a.  syncope due to orthostatic hypotension with head injury/orbital fx 01/2014. Event monitor without arrhythmia at that time.  . Hyperlipidemia   . Anxiety   . PVC's (premature ventricular contractions)     a. Seen on tele 03/2014.  Marland Kitchen NSVT (nonsustained ventricular tachycardia)     a. Seen on tele 03/2014.  Marland Kitchen PAT (paroxysmal atrial tachycardia)     a. Seen on tele 03/2014.  Marland Kitchen Protein calorie malnutrition   . CKD (chronic kidney disease), stage II     PAST SURGICAL HISTORY: Past Surgical History  Procedure Laterality Date  . Cataract extraction    . Oral tooth     FAMILY HISTORY: Family History  Problem Relation Age of Onset  . Coronary artery disease Father 42  . Brain cancer Sister   . Unexplained death Mother 61    SOCIAL HISTORY:  History   Social History  . Marital  Status: Single    Spouse Name: N/A    Number of Children: 87  . 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   Social History Main Topics  . Smoking status: Never Smoker   . Smokeless tobacco: Never Used  . Alcohol Use: No  . Drug Use: No  . Sexual Activity: Not on file   Other Topics Concern  . Not on file   Social History Narrative   Patient lives at home alone and she widowed.    Retired   Veterinary surgeon.   Right handed.   Caffeine - None    PHYSICAL EXAM   Filed Vitals:   12/27/14 1047  BP: 100/72  Pulse: 61  Height: 5\' 6"  (1.676 m)  Weight: 111 lb (50.349 kg)    Not recorded      Body mass index is 17.92 kg/(m^2).  PHYSICAL EXAMNIATION:  Gen: NAD, conversant, well nourised, obese, well groomed                     Cardiovascular: Regular rate rhythm, no peripheral edema, warm, nontender. Eyes: Conjunctivae clear without exudates or hemorrhage Neck: Supple, no carotid bruise. Pulmonary: Clear to auscultation bilaterally  Musculoskeletal: Tenderness along right nuchal line, Right upper trapezius, right cervical paraspinal muscles  NEUROLOGICAL EXAM:  MENTAL STATUS: Speech:    Speech is normal; fluent and spontaneous with normal comprehension.  Cognition:    The patient is oriented to person, place, and time;     recent and remote memory intact;     language fluent;     normal attention, concentration,     fund of knowledge.  CRANIAL NERVES: CN II: Visual fields are full to confrontation. Fundoscopic exam is normal with sharp discs, pupils were equal round reactive to light CN III, IV, VI: extraocular movement are normal. No ptosis. CN V: Facial sensation is intact to pinprick in all 3 divisions bilaterally. Corneal responses are intact.  CN VII: Face is symmetric with normal eye closure and smile. CN VIII: Hearing is normal to rubbing fingers CN IX, X: Palate elevates symmetrically. Phonation  is normal. CN XI: Head turning and shoulder shrug are intact CN XII: Tongue is midline with normal movements and no atrophy.  MOTOR: There is no pronator drift of out-stretched arms. Muscle bulk and tone are normal. Muscle strength is normal. She has mild tenderness around right nuchal line, left shoulder elevation  REFLEXES: Reflexes are 3 and symmetric at the biceps, triceps, knees, and ankles. Plantar responses are flexor.  SENSORY: Light touch, pinprick, position sense, and vibration sense are intact in fingers and toes.  COORDINATION: Rapid alternating movements and fine finger movements are intact. There is no dysmetria on finger-to-nose and heel-knee-shin. There are no abnormal or extraneous movements.   GAIT/STANCE: Posture is normal. Gait is steady with normal steps, base, arm swing, and turning. Heel and toe walking are normal. Tandem gait is normal.  Romberg is absent.     DIAGNOSTIC DATA (LABS, IMAGING, TESTING) - I reviewed patient records, labs, notes, testing and imaging myself where available.  Lab Results  Component Value Date   WBC 4.9 03/22/2014   HGB 14.5 03/22/2014   HCT 41.2 03/22/2014   MCV 94.5 03/22/2014   PLT 192 03/22/2014      Component Value Date/Time   NA 136* 03/23/2014 1445   K 4.4 03/23/2014 1445   CL 99 03/23/2014 1445   CO2 28 03/23/2014 1445   GLUCOSE 103* 03/23/2014 1445   BUN 10 03/23/2014 1445   CREATININE 0.86 03/23/2014 1445   CALCIUM 9.1 03/23/2014 1445   PROT 7.1 03/22/2014 1130   ALBUMIN 3.7 03/22/2014 1130   AST 28 03/22/2014 1130   ALT 11 03/22/2014 1130   ALKPHOS 83 03/22/2014 1130   BILITOT 0.7 03/22/2014 1130   GFRNONAA 64* 03/23/2014 1445   GFRAA 74* 03/23/2014 1445   ASSESSMENT AND PLAN  [name] is a 81 y.o. female complains of  neck pain since hyperextension injury in October 2015, right side neck pain, radiating to right shoulder, tenderness of right occipital nuchal line, right upper trapezius upon deep  palpitation, c/w with right occipital neuralgia. Symptoms has much improved, after trigger point injection along right nuchal line   Continue neck stretching exercise, hot compression, when necessary NSAIDs, only return to clinic for new issues   Marcial Pacas, M.D. Ph.D.  [facility] [address] [place], [place] [address] [phone]

## 2014-12-28 DIAGNOSIS — M9902 Segmental and somatic dysfunction of thoracic region: Secondary | ICD-10-CM | POA: Diagnosis not present

## 2014-12-28 DIAGNOSIS — M9903 Segmental and somatic dysfunction of lumbar region: Secondary | ICD-10-CM | POA: Diagnosis not present

## 2014-12-28 DIAGNOSIS — M546 Pain in thoracic spine: Secondary | ICD-10-CM | POA: Diagnosis not present

## 2014-12-28 DIAGNOSIS — M545 Low back pain: Secondary | ICD-10-CM | POA: Diagnosis not present

## 2014-12-28 DIAGNOSIS — M9901 Segmental and somatic dysfunction of cervical region: Secondary | ICD-10-CM | POA: Diagnosis not present

## 2014-12-28 DIAGNOSIS — M542 Cervicalgia: Secondary | ICD-10-CM | POA: Diagnosis not present

## 2014-12-29 ENCOUNTER — Ambulatory Visit: Payer: Medicare Other | Admitting: Neurology

## 2015-01-03 DIAGNOSIS — L570 Actinic keratosis: Secondary | ICD-10-CM | POA: Diagnosis not present

## 2015-01-03 DIAGNOSIS — L820 Inflamed seborrheic keratosis: Secondary | ICD-10-CM | POA: Diagnosis not present

## 2015-01-10 DIAGNOSIS — M542 Cervicalgia: Secondary | ICD-10-CM | POA: Diagnosis not present

## 2015-01-10 DIAGNOSIS — M9902 Segmental and somatic dysfunction of thoracic region: Secondary | ICD-10-CM | POA: Diagnosis not present

## 2015-01-10 DIAGNOSIS — M546 Pain in thoracic spine: Secondary | ICD-10-CM | POA: Diagnosis not present

## 2015-01-10 DIAGNOSIS — M545 Low back pain: Secondary | ICD-10-CM | POA: Diagnosis not present

## 2015-01-10 DIAGNOSIS — M9901 Segmental and somatic dysfunction of cervical region: Secondary | ICD-10-CM | POA: Diagnosis not present

## 2015-01-10 DIAGNOSIS — M9903 Segmental and somatic dysfunction of lumbar region: Secondary | ICD-10-CM | POA: Diagnosis not present

## 2015-01-16 ENCOUNTER — Telehealth: Payer: Self-pay

## 2015-01-16 DIAGNOSIS — I493 Ventricular premature depolarization: Secondary | ICD-10-CM

## 2015-01-16 DIAGNOSIS — I10 Essential (primary) hypertension: Secondary | ICD-10-CM

## 2015-01-16 NOTE — Telephone Encounter (Signed)
Ok to refill but needs to come in for BMET and Mg level

## 2015-01-16 NOTE — Telephone Encounter (Signed)
Will route to Dr. Radford Pax for approval to fill Mag Ox or does this need to be deferred to Dr. Percival Spanish?

## 2015-01-17 ENCOUNTER — Ambulatory Visit: Payer: Medicare Other | Admitting: Internal Medicine

## 2015-01-17 MED ORDER — MAGNESIUM OXIDE 400 (241.3 MG) MG PO TABS: 400.0000 mg | ORAL_TABLET | Freq: Every day | ORAL | Status: AC

## 2015-01-17 NOTE — Telephone Encounter (Signed)
Called patient about Dr. Theodosia Blender message. Patient able to come in for labs on Tuesday. Will put in refill for medications.

## 2015-01-23 ENCOUNTER — Other Ambulatory Visit (INDEPENDENT_AMBULATORY_CARE_PROVIDER_SITE_OTHER): Payer: Medicare Other | Admitting: *Deleted

## 2015-01-23 DIAGNOSIS — I493 Ventricular premature depolarization: Secondary | ICD-10-CM | POA: Diagnosis not present

## 2015-01-23 DIAGNOSIS — I10 Essential (primary) hypertension: Secondary | ICD-10-CM | POA: Diagnosis not present

## 2015-01-23 LAB — BASIC METABOLIC PANEL
BUN: 12 mg/dL (ref 6–23)
CALCIUM: 9.6 mg/dL (ref 8.4–10.5)
CO2: 29 mEq/L (ref 19–32)
Chloride: 98 mEq/L (ref 96–112)
Creatinine, Ser: 0.78 mg/dL (ref 0.40–1.20)
GFR: 75.76 mL/min (ref >60.00)
GLUCOSE: 99 mg/dL (ref 70–99)
POTASSIUM: 4.1 meq/L (ref 3.5–5.1)
SODIUM: 132 meq/L — AB (ref 135–145)

## 2015-01-23 LAB — MAGNESIUM: MAGNESIUM: 2 mg/dL (ref 1.5–2.5)

## 2015-01-24 DIAGNOSIS — M545 Low back pain: Secondary | ICD-10-CM | POA: Diagnosis not present

## 2015-01-24 DIAGNOSIS — M9903 Segmental and somatic dysfunction of lumbar region: Secondary | ICD-10-CM | POA: Diagnosis not present

## 2015-01-24 DIAGNOSIS — M9902 Segmental and somatic dysfunction of thoracic region: Secondary | ICD-10-CM | POA: Diagnosis not present

## 2015-01-24 DIAGNOSIS — M542 Cervicalgia: Secondary | ICD-10-CM | POA: Diagnosis not present

## 2015-01-24 DIAGNOSIS — M9901 Segmental and somatic dysfunction of cervical region: Secondary | ICD-10-CM | POA: Diagnosis not present

## 2015-01-24 DIAGNOSIS — M546 Pain in thoracic spine: Secondary | ICD-10-CM | POA: Diagnosis not present

## 2015-01-29 DIAGNOSIS — C44319 Basal cell carcinoma of skin of other parts of face: Secondary | ICD-10-CM | POA: Diagnosis not present

## 2015-01-31 ENCOUNTER — Ambulatory Visit: Payer: Medicare Other | Admitting: Internal Medicine

## 2015-02-06 ENCOUNTER — Ambulatory Visit (HOSPITAL_COMMUNITY): Payer: Medicare Other | Attending: Cardiology

## 2015-02-06 ENCOUNTER — Other Ambulatory Visit: Payer: Self-pay | Admitting: Cardiology

## 2015-02-06 ENCOUNTER — Other Ambulatory Visit: Payer: Self-pay

## 2015-02-06 ENCOUNTER — Ambulatory Visit (HOSPITAL_COMMUNITY): Payer: Medicare Other

## 2015-02-06 DIAGNOSIS — I351 Nonrheumatic aortic (valve) insufficiency: Secondary | ICD-10-CM | POA: Insufficient documentation

## 2015-02-06 DIAGNOSIS — I340 Nonrheumatic mitral (valve) insufficiency: Secondary | ICD-10-CM | POA: Diagnosis not present

## 2015-02-06 DIAGNOSIS — I10 Essential (primary) hypertension: Secondary | ICD-10-CM

## 2015-02-06 DIAGNOSIS — I341 Nonrheumatic mitral (valve) prolapse: Secondary | ICD-10-CM | POA: Insufficient documentation

## 2015-02-06 DIAGNOSIS — I071 Rheumatic tricuspid insufficiency: Secondary | ICD-10-CM | POA: Diagnosis not present

## 2015-02-06 DIAGNOSIS — I059 Rheumatic mitral valve disease, unspecified: Secondary | ICD-10-CM | POA: Diagnosis present

## 2015-02-07 ENCOUNTER — Encounter: Payer: Self-pay | Admitting: Cardiology

## 2015-02-09 ENCOUNTER — Telehealth: Payer: Self-pay | Admitting: Cardiology

## 2015-02-09 DIAGNOSIS — I341 Nonrheumatic mitral (valve) prolapse: Secondary | ICD-10-CM

## 2015-02-09 NOTE — Telephone Encounter (Signed)
New message      Patient want echo results.  Please call her and let her know something (they have or have not been read)

## 2015-02-09 NOTE — Telephone Encounter (Signed)
Patient called and notified of echo results. Repeat echo for 1 year ordered.

## 2015-02-14 ENCOUNTER — Telehealth: Payer: Self-pay | Admitting: Cardiology

## 2015-02-14 NOTE — Telephone Encounter (Signed)
Diane from Dr. Sheran Luz DDS called.  Patient is to have dental cleaning at 2pm today.  Inquiring if patient needs to be on any antibiotic prophylaxis for dental cleaning

## 2015-02-16 ENCOUNTER — Other Ambulatory Visit: Payer: Self-pay

## 2015-02-16 NOTE — Telephone Encounter (Signed)
Approved      Disp Refills Start End    magnesium oxide (MAG-OX) 400 (241.3 MG) MG tablet 90 tablet 3 01/17/2015     Sig - Route:  Take 1 tablet (400 mg total) by mouth daily. - Oral    Class:  Normal    DAW:  No    Authorizing Provider:  Sueanne Margarita, MD    Ordering User:  Dalphine Handing, RN

## 2015-02-16 NOTE — Telephone Encounter (Signed)
She does not need SBE prophylaxis.

## 2015-02-16 NOTE — Telephone Encounter (Signed)
Patient informed that at this point that she does not need to be on prophylactic antibiotics for teeth cleaning

## 2015-02-27 DIAGNOSIS — M9901 Segmental and somatic dysfunction of cervical region: Secondary | ICD-10-CM | POA: Diagnosis not present

## 2015-02-27 DIAGNOSIS — M545 Low back pain: Secondary | ICD-10-CM | POA: Diagnosis not present

## 2015-02-27 DIAGNOSIS — M546 Pain in thoracic spine: Secondary | ICD-10-CM | POA: Diagnosis not present

## 2015-02-27 DIAGNOSIS — M9903 Segmental and somatic dysfunction of lumbar region: Secondary | ICD-10-CM | POA: Diagnosis not present

## 2015-02-27 DIAGNOSIS — M542 Cervicalgia: Secondary | ICD-10-CM | POA: Diagnosis not present

## 2015-02-27 DIAGNOSIS — M9902 Segmental and somatic dysfunction of thoracic region: Secondary | ICD-10-CM | POA: Diagnosis not present

## 2015-03-02 ENCOUNTER — Other Ambulatory Visit: Payer: Self-pay | Admitting: *Deleted

## 2015-03-02 MED ORDER — METOPROLOL SUCCINATE ER 25 MG PO TB24: 25.0000 mg | ORAL_TABLET | Freq: Every day | ORAL | Status: AC

## 2015-03-06 DIAGNOSIS — M9902 Segmental and somatic dysfunction of thoracic region: Secondary | ICD-10-CM | POA: Diagnosis not present

## 2015-03-06 DIAGNOSIS — M546 Pain in thoracic spine: Secondary | ICD-10-CM | POA: Diagnosis not present

## 2015-03-06 DIAGNOSIS — M9901 Segmental and somatic dysfunction of cervical region: Secondary | ICD-10-CM | POA: Diagnosis not present

## 2015-03-06 DIAGNOSIS — M9903 Segmental and somatic dysfunction of lumbar region: Secondary | ICD-10-CM | POA: Diagnosis not present

## 2015-03-06 DIAGNOSIS — M542 Cervicalgia: Secondary | ICD-10-CM | POA: Diagnosis not present

## 2015-03-06 DIAGNOSIS — M545 Low back pain: Secondary | ICD-10-CM | POA: Diagnosis not present

## 2015-03-08 ENCOUNTER — Ambulatory Visit: Payer: Medicare Other | Admitting: Cardiology

## 2015-03-09 DIAGNOSIS — M9901 Segmental and somatic dysfunction of cervical region: Secondary | ICD-10-CM | POA: Diagnosis not present

## 2015-03-09 DIAGNOSIS — M545 Low back pain: Secondary | ICD-10-CM | POA: Diagnosis not present

## 2015-03-09 DIAGNOSIS — M546 Pain in thoracic spine: Secondary | ICD-10-CM | POA: Diagnosis not present

## 2015-03-09 DIAGNOSIS — M9903 Segmental and somatic dysfunction of lumbar region: Secondary | ICD-10-CM | POA: Diagnosis not present

## 2015-03-09 DIAGNOSIS — M542 Cervicalgia: Secondary | ICD-10-CM | POA: Diagnosis not present

## 2015-03-09 DIAGNOSIS — M9902 Segmental and somatic dysfunction of thoracic region: Secondary | ICD-10-CM | POA: Diagnosis not present

## 2015-03-12 DIAGNOSIS — M9902 Segmental and somatic dysfunction of thoracic region: Secondary | ICD-10-CM | POA: Diagnosis not present

## 2015-03-12 DIAGNOSIS — M5415 Radiculopathy, thoracolumbar region: Secondary | ICD-10-CM | POA: Diagnosis not present

## 2015-03-12 DIAGNOSIS — M546 Pain in thoracic spine: Secondary | ICD-10-CM | POA: Diagnosis not present

## 2015-03-19 DIAGNOSIS — M9901 Segmental and somatic dysfunction of cervical region: Secondary | ICD-10-CM | POA: Diagnosis not present

## 2015-03-19 DIAGNOSIS — S233XXA Sprain of ligaments of thoracic spine, initial encounter: Secondary | ICD-10-CM | POA: Diagnosis not present

## 2015-03-19 DIAGNOSIS — M9903 Segmental and somatic dysfunction of lumbar region: Secondary | ICD-10-CM | POA: Diagnosis not present

## 2015-03-19 DIAGNOSIS — M9902 Segmental and somatic dysfunction of thoracic region: Secondary | ICD-10-CM | POA: Diagnosis not present

## 2015-03-21 ENCOUNTER — Ambulatory Visit: Payer: Medicare Other | Admitting: Internal Medicine

## 2015-03-22 ENCOUNTER — Ambulatory Visit: Payer: Medicare Other | Admitting: Cardiology

## 2015-03-23 DIAGNOSIS — M9901 Segmental and somatic dysfunction of cervical region: Secondary | ICD-10-CM | POA: Diagnosis not present

## 2015-03-23 DIAGNOSIS — M9903 Segmental and somatic dysfunction of lumbar region: Secondary | ICD-10-CM | POA: Diagnosis not present

## 2015-03-23 DIAGNOSIS — S233XXA Sprain of ligaments of thoracic spine, initial encounter: Secondary | ICD-10-CM | POA: Diagnosis not present

## 2015-03-23 DIAGNOSIS — M9902 Segmental and somatic dysfunction of thoracic region: Secondary | ICD-10-CM | POA: Diagnosis not present

## 2015-03-26 ENCOUNTER — Telehealth: Payer: Self-pay | Admitting: Cardiology

## 2015-03-26 NOTE — Telephone Encounter (Signed)
°  STAT if patient is at the pharmacy , call can be transferred to refill team.   1. Which medications need to be refilled?Metoprolol-Pt is in [place] not find her medicine  2. Which pharmacy/location is medication to be sent to?[phone]  3. Do they need a 30 day or 90 day supply? [place]

## 2015-03-26 NOTE — Telephone Encounter (Signed)
Phoned in to [place] [facility], notified patient.

## 2015-03-27 ENCOUNTER — Ambulatory Visit: Payer: Medicare Other | Admitting: Cardiology

## 2015-04-24 ENCOUNTER — Ambulatory Visit: Payer: Medicare Other | Admitting: Cardiology

## 2015-06-07 DIAGNOSIS — Z23 Encounter for immunization: Secondary | ICD-10-CM | POA: Diagnosis not present

## 2015-06-07 DIAGNOSIS — I10 Essential (primary) hypertension: Secondary | ICD-10-CM | POA: Diagnosis not present

## 2015-06-07 DIAGNOSIS — E785 Hyperlipidemia, unspecified: Secondary | ICD-10-CM | POA: Diagnosis not present

## 2015-06-07 DIAGNOSIS — Z1239 Encounter for other screening for malignant neoplasm of breast: Secondary | ICD-10-CM | POA: Diagnosis not present

## 2015-06-07 DIAGNOSIS — C4491 Basal cell carcinoma of skin, unspecified: Secondary | ICD-10-CM | POA: Diagnosis not present

## 2015-06-07 DIAGNOSIS — Z0000 Encounter for general adult medical examination without abnormal findings: Secondary | ICD-10-CM | POA: Diagnosis not present

## 2015-06-07 DIAGNOSIS — M1991 Primary osteoarthritis, unspecified site: Secondary | ICD-10-CM | POA: Diagnosis not present

## 2015-06-07 DIAGNOSIS — I2510 Atherosclerotic heart disease of native coronary artery without angina pectoris: Secondary | ICD-10-CM | POA: Diagnosis not present

## 2015-06-11 DIAGNOSIS — D0461 Carcinoma in situ of skin of right upper limb, including shoulder: Secondary | ICD-10-CM | POA: Diagnosis not present

## 2015-06-11 DIAGNOSIS — Z85828 Personal history of other malignant neoplasm of skin: Secondary | ICD-10-CM | POA: Diagnosis not present

## 2015-06-11 DIAGNOSIS — C4442 Squamous cell carcinoma of skin of scalp and neck: Secondary | ICD-10-CM | POA: Diagnosis not present

## 2015-06-11 DIAGNOSIS — L821 Other seborrheic keratosis: Secondary | ICD-10-CM | POA: Diagnosis not present

## 2015-06-11 DIAGNOSIS — L308 Other specified dermatitis: Secondary | ICD-10-CM | POA: Diagnosis not present

## 2015-06-11 DIAGNOSIS — D485 Neoplasm of uncertain behavior of skin: Secondary | ICD-10-CM | POA: Diagnosis not present

## 2015-06-11 DIAGNOSIS — L570 Actinic keratosis: Secondary | ICD-10-CM | POA: Diagnosis not present

## 2015-06-19 DIAGNOSIS — C4442 Squamous cell carcinoma of skin of scalp and neck: Secondary | ICD-10-CM | POA: Diagnosis not present

## 2015-07-02 DIAGNOSIS — C44622 Squamous cell carcinoma of skin of right upper limb, including shoulder: Secondary | ICD-10-CM | POA: Diagnosis not present

## 2015-08-28 DIAGNOSIS — J309 Allergic rhinitis, unspecified: Secondary | ICD-10-CM | POA: Diagnosis not present

## 2015-09-13 DIAGNOSIS — Z9882 Breast implant status: Secondary | ICD-10-CM | POA: Diagnosis not present

## 2015-09-13 DIAGNOSIS — Z1231 Encounter for screening mammogram for malignant neoplasm of breast: Secondary | ICD-10-CM | POA: Diagnosis not present

## 2015-09-13 DIAGNOSIS — I10 Essential (primary) hypertension: Secondary | ICD-10-CM | POA: Diagnosis not present

## 2015-10-04 DIAGNOSIS — M1991 Primary osteoarthritis, unspecified site: Secondary | ICD-10-CM | POA: Diagnosis not present

## 2015-10-04 DIAGNOSIS — Z0000 Encounter for general adult medical examination without abnormal findings: Secondary | ICD-10-CM | POA: Diagnosis not present

## 2015-10-04 DIAGNOSIS — I10 Essential (primary) hypertension: Secondary | ICD-10-CM | POA: Diagnosis not present

## 2015-10-04 DIAGNOSIS — E785 Hyperlipidemia, unspecified: Secondary | ICD-10-CM | POA: Diagnosis not present

## 2016-07-01 DIAGNOSIS — H9190 Unspecified hearing loss, unspecified ear: Secondary | ICD-10-CM | POA: Diagnosis not present

## 2016-07-01 DIAGNOSIS — I10 Essential (primary) hypertension: Secondary | ICD-10-CM | POA: Diagnosis not present

## 2016-07-01 DIAGNOSIS — Z0000 Encounter for general adult medical examination without abnormal findings: Secondary | ICD-10-CM | POA: Diagnosis not present

## 2016-07-01 DIAGNOSIS — Z23 Encounter for immunization: Secondary | ICD-10-CM | POA: Diagnosis not present

## 2016-07-01 DIAGNOSIS — Z7189 Other specified counseling: Secondary | ICD-10-CM | POA: Diagnosis not present

## 2016-07-19 IMAGING — CT CT NECK W/ CM
4 of 6 series · 14 of 33 positions shown, 16 images · IV contrast (75CC OMNI 300)
Comparison: None.

CLINICAL DATA: Neck pain. Chronic LEFT ear infection. Evaluate for
Thornwaldt cyst for elongated styloid process.

EXAM:
CT NECK WITH CONTRAST
TECHNIQUE: Multidetector CT imaging of the neck was performed using the
standard protocol following the bolus administration of intravenous
contrast.
CONTRAST:  75mL OMNIPAQUE IOHEXOL 300 MG/ML  SOLN

[Series 3: axial neck · axial · 0.37mm/px · z∈[+145,+218]mm · 2 of 89 slices shown]
[im 30/89  bone]
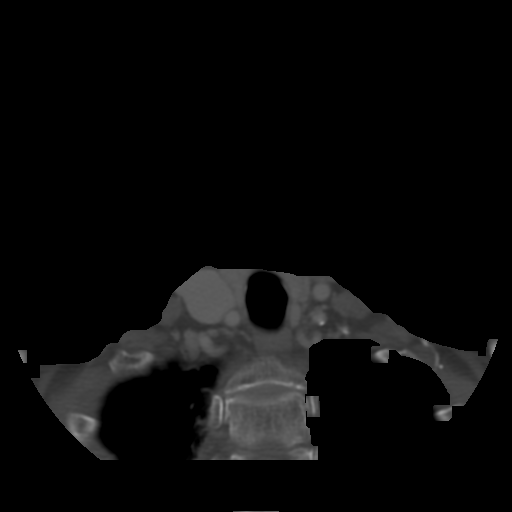
[im 59/89  bone]
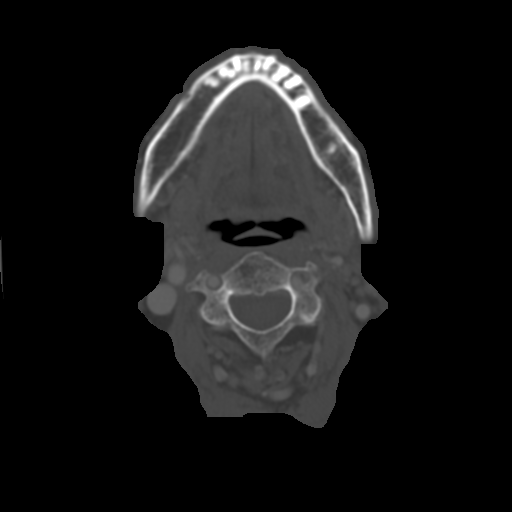

[Series 200: coronal · coronal · 0.44mm/px · 3 of 94 slices shown]
[im 22/94  bone]
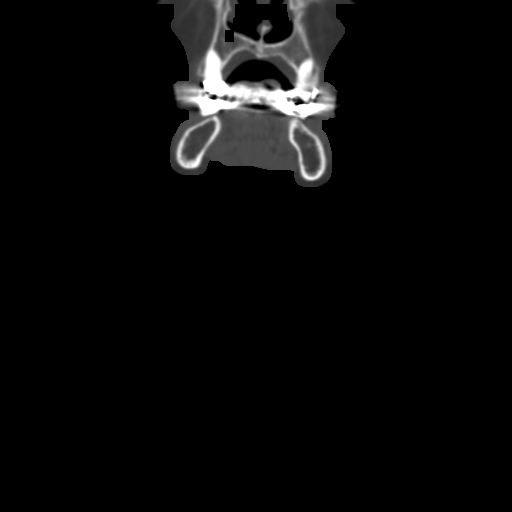
[im 39/94  bone]
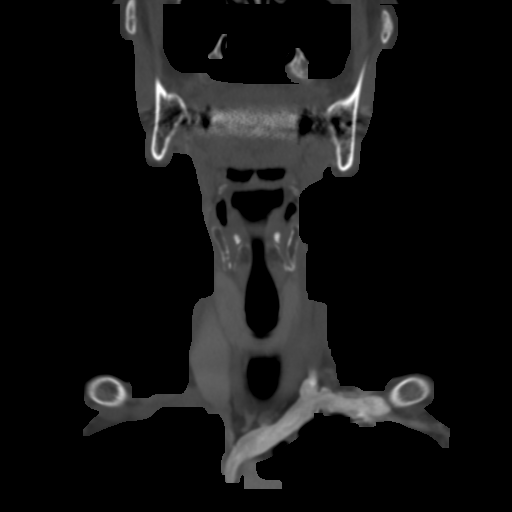
[im 56/94  bone]
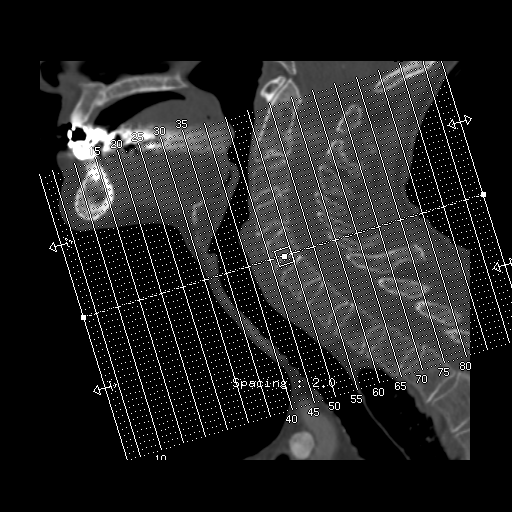

[Series 201: sagittal · sagittal · 0.44mm/px · 5 of 97 slices shown, 6 images]
[im 33/97  bone]
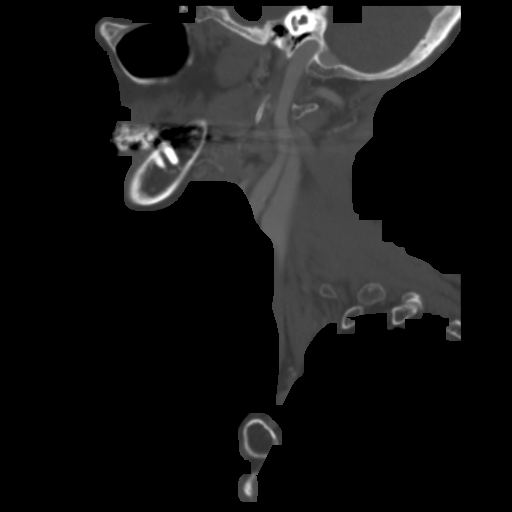
[im 41/97  bone]
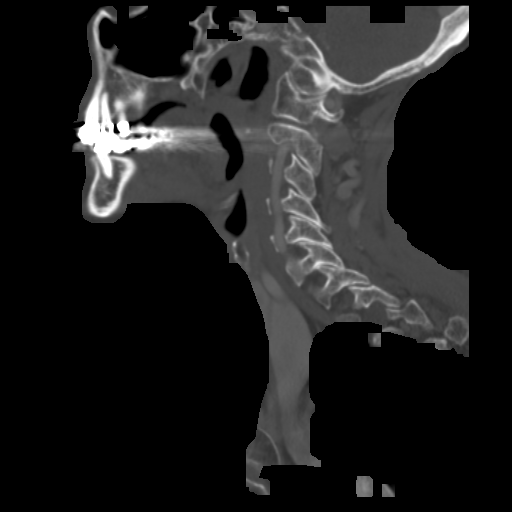
[im 49/97  soft-tissue]
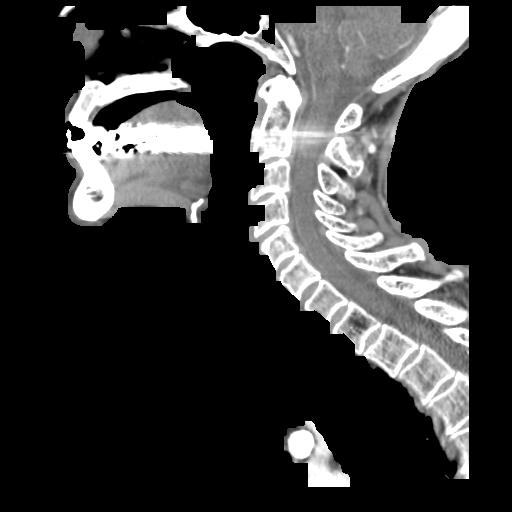
[im 49/97  bone]
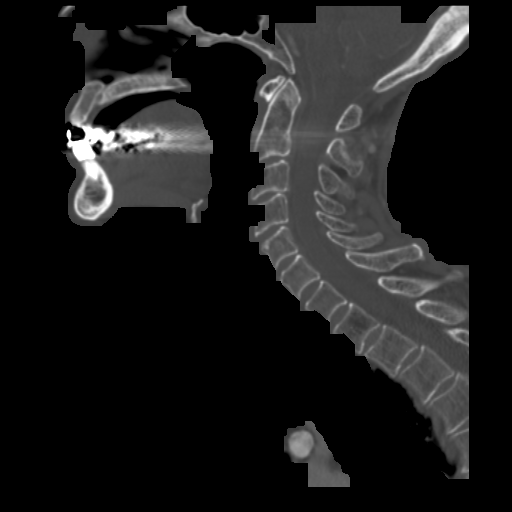
[im 57/97  bone]
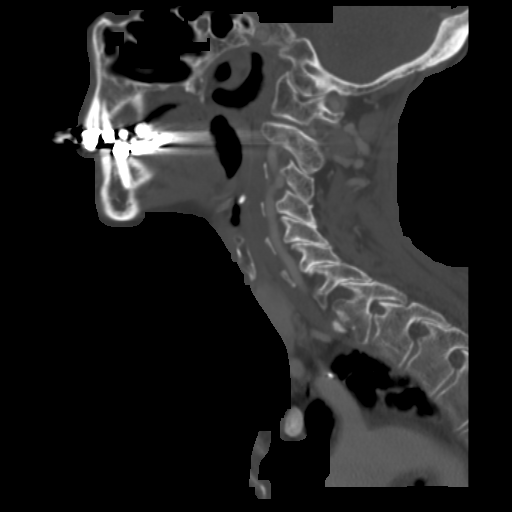
[im 65/97  bone]
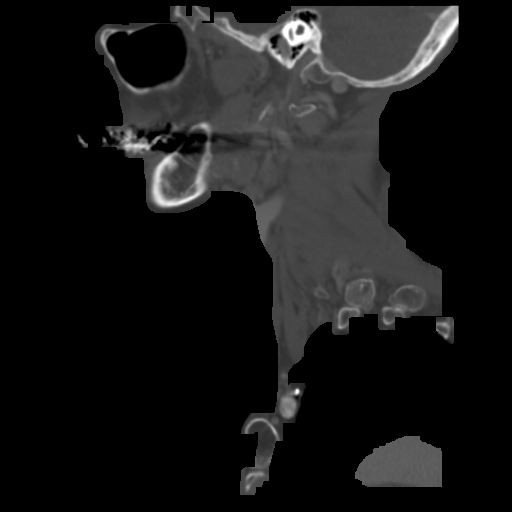

[Series 202: angled axial · axial · 0.44mm/px · z∈[+84,+226]mm · 4 of 121 slices shown, 5 images]
[im 25/121  soft-tissue]
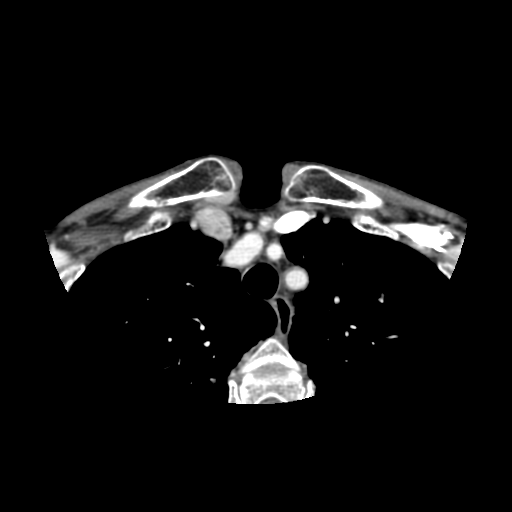
[im 25/121  bone]
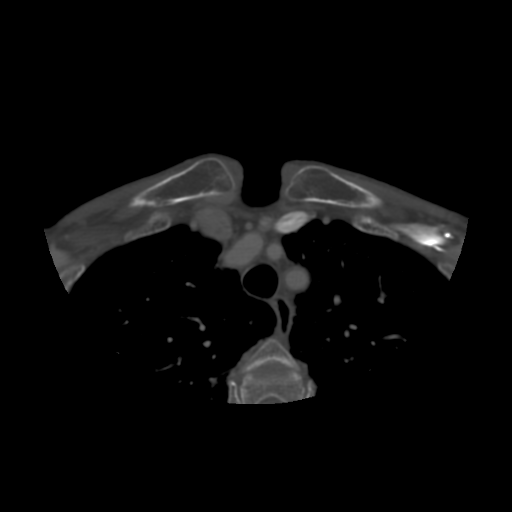
[im 49/121  bone]
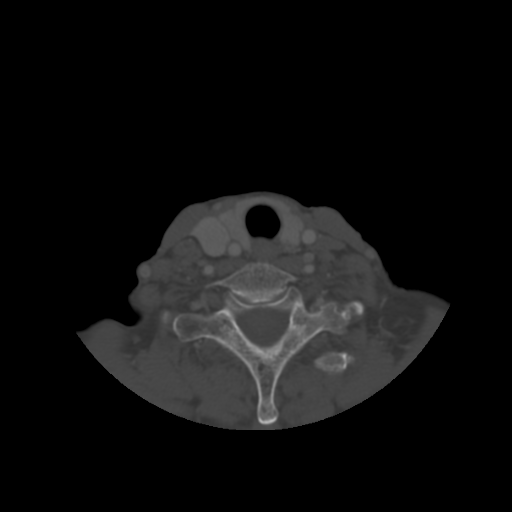
[im 73/121  bone]
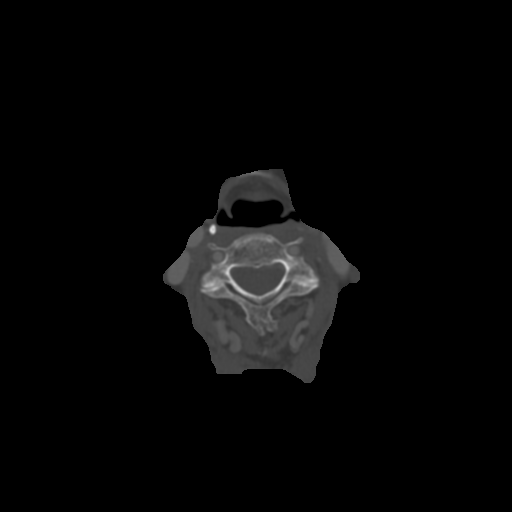
[im 97/121  bone]
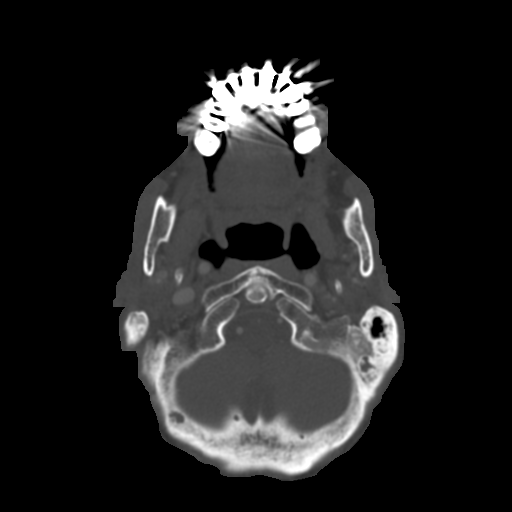

[14 of 33 positions shown; findings below may reference images not displayed]

FINDINGS: Suprahyoid neck: In the suprahyoid neck, the styloid processes are
elongated and intimately associated with the RIGHT and LEFT cornu of
the hyoid bone. The styloid process projects more medially on the
LEFT than the RIGHT, but both appear ossified and unusually
elongated. There is slight mass effect on the airway. In the
appropriate clinical setting, the CT findings could be consistent
with the diagnosis of Paterno syndrome.

Unremarkable major and minor salivary glands. No mucosal lesion. No
sinus disease. No middle ear or mastoid fluid. No nasopharyngeal
lesions. Normal torus tubarius.

Larynx:  Negative.

Infrahyoid neck:  Negative.

Lymph nodes:  No pathologic adenopathy.

Upper chest/mediastinum:  Hyperinflation without lung apex lesion.

Additional: Atherosclerosis. Cervical spondylosis. No worrisome
osseous lesions. Negative visualized intracranial compartment.
IMPRESSION: Findings consistent with symptomatic BILATERAL ossified styloid
process or Paterno syndrome.

No evidence for Thornwaldt cyst in the midline.

## 2016-08-04 DIAGNOSIS — I10 Essential (primary) hypertension: Secondary | ICD-10-CM | POA: Diagnosis not present

## 2016-08-11 DIAGNOSIS — I480 Paroxysmal atrial fibrillation: Secondary | ICD-10-CM | POA: Diagnosis not present

## 2016-11-28 DIAGNOSIS — M2010 Hallux valgus (acquired), unspecified foot: Secondary | ICD-10-CM | POA: Diagnosis not present

## 2016-11-28 DIAGNOSIS — M79609 Pain in unspecified limb: Secondary | ICD-10-CM | POA: Diagnosis not present

## 2016-11-28 DIAGNOSIS — M216X9 Other acquired deformities of unspecified foot: Secondary | ICD-10-CM | POA: Diagnosis not present

## 2016-11-28 DIAGNOSIS — M2020 Hallux rigidus, unspecified foot: Secondary | ICD-10-CM | POA: Diagnosis not present

## 2017-12-17 ENCOUNTER — Ambulatory Visit

## 2017-12-29 ENCOUNTER — Ambulatory Visit

## 2017-12-29 NOTE — Progress Notes
[facility]    Patient: Erika Kline  MRN: [id number]  Date of Service: 12/29/2017    PMD: Erika Loose, MD      Chief Complaint:   No chief complaint on file.      History of Present Illness:   Erika Kline is a 84 y.o. female who presents with est care    Transfer from Dr. Heinz Kline- last seen 10/04/15  Seen at [facility]- Dr. Shella Kline in 07/2016 and Dr. Myrtie Kline in 07/2017    HTN  pAF no recent heart monitor. check labs. zio ordered to confirm. ecg ordered but pt had left prior. will follow-up on zio.  Anxiety- Lexapro 10          Past Medical History:     Past Medical History:   Diagnosis Date   ??? Allergy, unspecified not elsewhere classified    ??? Arthritis    ??? Basal cell carcinoma    ??? Cancer (HCC/RAF)     Stomach Cancer S/P chemo   ??? Depression    ??? Gastric lymphoma (HCC/RAF)    ??? Gastric lymphoma (HCC/RAF)    ??? Gastric lymphoma (HCC/RAF)    ??? Hyperlipidemia    ??? Hypertension        Past Surgical History:     Past Surgical History:   Procedure Laterality Date   ??? breast implants  1978   ??? BREAST SURGERY           Medications:     No outpatient prescriptions have been marked as taking for the 12/29/17 encounter (Appointment) with Erika Loose, MD.       Allergies:     Allergies   Allergen Reactions   ??? Amlodipine Besylate Rash   ??? Clarithromycin Rash       Family History:     Family History   Problem Relation Age of Onset   ??? Heart attack Father    ??? Cancer Sister        Social History:     Social History   Substance Use Topics   ??? Smoking status: Never Smoker   ??? Smokeless tobacco: Not on file   ??? Alcohol use No       Review of Systems:   14 point ROS performed and negative except as mentioned in HPI    Physical Exam:    There were no vitals taken for this visit.  Gen: WD/WN in NAD  HEENT: NC/AT, PERRL, EOMI, sclerae anicteric, OP clear, MMM, TM unremarkable, nasal mucosa unremarkable  Neck: supple, no JVD, no masses/LAD/thyromegaly  CV: RRR, normal S1 and S2, no murmurs, rubs, or gallops Pulm: CTAB   Abd: soft, non-tender, non-distended, normal BS, no HSM  Ext: warm and well-perfused. No pitting edema. 2+ DP and PT pulses bilaterally.  Neuro: CN II-XII grossly intact, strength 5/5 bilaterally, sensation to light touch intact,  no focal neurologic deficits  Psych: normal mood/affect  Skin: no lesions    Laboratory Data: Reviewed     Lab Results   Component Value Date    WBC 5.20 06/07/2015    HGB 13.5 06/07/2015    HCT 40.2 06/07/2015    MCV 95.7 06/07/2015    PLT 186 06/07/2015     Lab Results   Component Value Date    NA 140 06/07/2015    K 4.7 06/07/2015    CL 97 06/07/2015    CO2 28 06/07/2015    BUN 13 06/07/2015    CREAT  0.83 06/07/2015     Lab Results   Component Value Date    ALT 14 06/07/2015    AST 31 06/07/2015    ALKPHOS 114 (H) 06/07/2015    BILITOT 0.5 06/07/2015     Lab Results   Component Value Date    INR 1.0 10/08/2011    PT 10.7 10/08/2011       Studies: Reviewed       Assessment/Plan:   Erika [facility]line is a 84 y.o. female       There are no diagnoses linked to this encounter.    There are no diagnoses linked to this encounter.     #HCM:    Preventive Health Care:       IMMUNIZATIONS:   SCREENING, non-behavioral:  SCREENING, behavioral:    []  Influenza  []  AAA  []  BP    []  Tdap  []  Mammogram  []  Obesity    []  HPV  []  PAP  []  Alcohol    []  Varicella  []  Colonoscopy  []  Smoking cessation    []  PPSV23  []  STIs (HIV, GC, RPR)  []  Diet    []  MMR  []  Lipids  []  Skin cancer    []  Hep A  []  A1c  []  Depression    []  Hep B  []  DEXA  []  STI risk    []  MCV 4  []  Vitamin D  []  Fall prevention      []  HCV- born between 47-1963      Health Maintenance   Topic Date Due   ??? Shingles (Shingrix) Vaccine (1 of 2) 03/29/1986   ??? Annual Preventive Wellness Visit  03/29/1986   ??? Influenza Vaccine (1) 01/31/2018   ??? Tdap/Td Vaccine (2 - Td) 04/18/2019   ??? Pneumococcal Vaccine  Completed   ??? Osteoporosis Early Detection DEXA Scan  Completed         Erika Loose, MD  12/29/2017

## 2018-01-12 ENCOUNTER — Telehealth

## 2018-01-12 NOTE — Telephone Encounter
Call Back Request    MD:  Dr. Marlowe Sax     Reason for call back: Lauren from [facility] states patient has an oncology consult with them 08/22 and would like to know which hem/onc doctor she was seeing previously to retrieve records. Lauren states patient unable to provide information and advised to reach pcp. Please advise.     Any Symptoms:  []  Yes  [x]  No      ? If yes, what symptoms are you experiencing:    o Duration of symptoms (how long):    o Have you taken medication for symptoms (OTC or Rx):      Patient or caller has been notified of the 24-48 hour turnaround time.

## 2018-01-13 NOTE — Telephone Encounter
Called back no answer, unsure what dept she is in.  No record on file for hematology.   Only one encounter as a no show for Dr. Eli Phillips.

## 2018-01-13 NOTE — Telephone Encounter
Notified no record on file.

## 2021-04-08 ENCOUNTER — Telehealth: Payer: BLUE CROSS/BLUE SHIELD

## 2021-04-08 NOTE — Telephone Encounter
Diagnosis-  Esophageal cancer  [x] - Newly Diagnosed    [] - Second opinion  [] - Continuation of care     Type of insurance/Is auth needed? If so, status of auth?  [place] [employer] [place] [place] [employer]       Date/Time of when appt was scheduled  Tbd,       Referring MD/ph. Number:   Pt was seen by Dr. Constance Holster in 2013   Status of medical records  [] - [facility]  [] - Epic care everywhere   [x] - Outside facility/ office/ Hospital- Patient has been provided our fax number and medical records will be faxed     Where new patient paperwork is supposed to be sent  [] - Office   [x] - Mailed to home       Any other pertinent information: Pts daughter called in Pt was last seen by Dr. Constance Holster in 2013 for stomach cancer bur has a new dx of Esophageal cancer and would like to know if Dr. Constance Holster can see her for her new dx or if he can recommend a doctor for the Pt to see. Please advise.    CBN [phone]

## 2021-04-09 ENCOUNTER — Non-Acute Institutional Stay: Payer: BLUE CROSS/BLUE SHIELD

## 2021-04-09 ENCOUNTER — Telehealth: Payer: MEDICARE

## 2021-04-09 NOTE — Telephone Encounter
Call Back Request      Reason for call back: Pt's daughter Lauryl would like to know if Dr. Constance Holster can refer pt to a GI at [facility] in [facility]. [facility] would like to pt have a staging scope, preferably before seeing Dr. Constance Holster. Pt schedule for consult with Dr. Constance Holster 04/22/21    CBN: [phone] Lauryl    Any Symptoms:  []  Yes  [x]  No       If yes, what symptoms are you experiencing:    o Duration of symptoms (how long):    o Have you taken medication for symptoms (OTC or Rx):      If call was taken outside of clinic hours:    [] Patient or caller has been notified that this message was sent outside of normal clinic hours.     [] Patient or caller has been warm transferred to the physician's answering service. If applicable, patient or caller informed to please call us back if symptoms progress.  Patient or caller has been notified of the turnaround time of 1-2 business day(s).

## 2021-04-09 NOTE — Telephone Encounter
Spoke with daughter, pt currently still hospitalized, should be released at end of this week and she will be bringing mom home next week.    Scheduled consult for 11/21

## 2021-04-10 NOTE — Telephone Encounter
Spoke with daughter, informed her that since pt has not been seen since 2013 that Dr. Constance Holster is unable to put in orders for her until she re-establish care with him. She will try to do in [place] or wait until they see [name].

## 2021-04-15 ENCOUNTER — Telehealth: Payer: BLUE CROSS/BLUE SHIELD

## 2021-04-15 NOTE — Telephone Encounter
Message to Practice/Provider      Message: Pt daughter is requesting a telephone appt due to pt is currently admitted in a hospital and being transferred to a SNF.  Pt daughter will drop off the pt imaging disc off this week. Please  assist.     Return call is not being requested by the patient or caller.    Patient or caller has been notified of the turnaround time of 1-2 business day(s).         CB:[phone]

## 2021-04-15 NOTE — Telephone Encounter
Call Back Request      Reason for call back: Per pt's daughter stated she would like to have Dr. Bridgett Larsson refer pt to a primary dr in [place]. I let her know this pt has never seen Dr. Bridgett Larsson, so she may not be able to, she stated when she spoke with insurance they told her Dr. Bridgett Larsson was her primary doctor. She would like to know if it is possible to still have Dr. Bridgett Larsson recommend a PC dr in [facility].    Any Symptoms:  []  Yes  [x]  No       If yes, what symptoms are you experiencing:    o Duration of symptoms (how long):    o Have you taken medication for symptoms (OTC or Rx):      If call was taken outside of clinic hours:    [] Patient or caller has been notified that this message was sent outside of normal clinic hours.     [] Patient or caller has been warm transferred to the physician's answering service. If applicable, patient or caller informed to please call us back if symptoms progress.  Patient or caller has been notified of the turnaround time of 1-2 business day(s).

## 2021-04-15 NOTE — Telephone Encounter
Unfortunately I dont know of any PCPs there

## 2021-04-16 ENCOUNTER — Telehealth: Payer: MEDICARE

## 2021-04-16 NOTE — Telephone Encounter
Call Back Request      Reason for call back: Pt's daughter is calling stating that pt saw Dr. Marlowe Sax at this clinic and wants to know where and who he referred his pts to after he left.  Pt was last seen on 10/04/2015 and would like to establish care with a new Dr. Please advise, thank you.     Any Symptoms:  []  Yes  [x]  No       If yes, what symptoms are you experiencing:    o Duration of symptoms (how long):    o Have you taken medication for symptoms (OTC or Rx):      If call was taken outside of clinic hours:    [] Patient or caller has been notified that this message was sent outside of normal clinic hours.     [] Patient or caller has been warm transferred to the physician's answering service. If applicable, patient or caller informed to please call us back if symptoms progress.  Patient or caller has been notified of the turnaround time of 1-2 business day(s).

## 2021-04-16 NOTE — Telephone Encounter
Pt notified of information below.

## 2021-04-17 ENCOUNTER — Inpatient Hospital Stay: Payer: BLUE CROSS/BLUE SHIELD

## 2021-04-17 ENCOUNTER — Inpatient Hospital Stay: Payer: MEDICARE

## 2021-04-17 DIAGNOSIS — Z719 Counseling, unspecified: Secondary | ICD-10-CM

## 2021-04-17 NOTE — Telephone Encounter
Spoke with pt's daughter, informed her that while pt is admitted we are unable to see pt via telehealth due to insurance, advised her to ask for oncology consult while hospitalized.    Daughter said pt got an abscess when they inserted the feeding tube so they need to keep her longer. They're hoping to have her transferred to SNF if not able to come back home to [place]. Told daughter if she goes to SNF that we can try to schedule telehealth prior to her coming back home but if she makes it out prior to her r/s appt on 12/1, to please reach out to me.

## 2021-04-22 ENCOUNTER — Telehealth: Payer: MEDICARE

## 2021-04-22 ENCOUNTER — Non-Acute Institutional Stay: Payer: MEDICARE

## 2021-04-22 NOTE — Telephone Encounter
Message to Practice/Provider      Message: Per daughter Lauryl, inquiring on who patient's previous doctor Dr. Marlowe Sax referred his patients to as she wants to establish care with that particular doctor. Please advise.     Return call is not being requested by the patient or caller.    Patient or caller has been notified of the turnaround time of 1-2 business day(s).

## 2021-04-29 NOTE — Telephone Encounter
Call Back Request      Reason for call back: Patient's daughter is requesting to speak with Nichelle in regards to rescheduling appt that pt has on 12/1 and also doing a telemedicine appt. Please assist, thank you.    CBN [phone]    Any Symptoms:  []  Yes  [x]  No       If yes, what symptoms are you experiencing:    o Duration of symptoms (how long):    o Have you taken medication for symptoms (OTC or Rx):      If call was taken outside of clinic hours:    [] Patient or caller has been notified that this message was sent outside of normal clinic hours.     [] Patient or caller has been warm transferred to the physician's answering service. If applicable, patient or caller informed to please call us back if symptoms progress.  Patient or caller has been notified of the turnaround time of 1-2 business day(s).

## 2021-04-29 NOTE — Telephone Encounter
Spoke with pt's daughter, mom has been discharged from hospital but has sent to SNF for rehab, should be discharged sometime next week and they plan to drive her home.    Will cancel in person appt for 12/1.    Daughters would like to schedule telehealth on 12/5 to get things going and schedule her to come in person around the 15th.

## 2021-04-30 NOTE — Telephone Encounter
Check with cindy and joni---apparently we aren't allowed to bill for out of state video visits?

## 2021-04-30 NOTE — Telephone Encounter
Spoke with daughter, scheduled VV for 12/5 at 2pm, since she's in [place] and mom and her sister are in [place], can we do VV via mychart? Or would zoom be easier?

## 2021-05-02 ENCOUNTER — Telehealth: Payer: MEDICARE

## 2021-05-02 ENCOUNTER — Non-Acute Institutional Stay: Payer: MEDICARE

## 2021-05-02 NOTE — Telephone Encounter
PDL Call to Clinic    Reason for Call:Pt daughter is calling to follow up with Nichelle  on further information needed for pt appt on 12/05.     Appointment Related?  [x]  Yes  []  No     If yes;  Date:12/05  Time:2    Call warm transferred to PDL: [x]  Yes  []  No    Call Received by Clinic Representative: Elmo Putt  If call not answered/not accepted, call received by Patient Services Representative:      JO:[phone]

## 2021-05-02 NOTE — Telephone Encounter
Accepted pdl call  Per Gaylord Shih MyChart should allow up to 5 people.   If they can't figure out to get the mychart to work then we can switch to zoom.  Pt daughters worried about not being able to work the app the day of. Advised I will send literature on how to do it via pt mychart and if they still feel they can't do that to call back  Nichelle, pt daughter still has additional questions and when you can, can you kindly call the patient  Thank you so much  Elmo Putt

## 2021-05-03 ENCOUNTER — Inpatient Hospital Stay: Payer: MEDICARE

## 2021-05-03 DIAGNOSIS — Z719 Counseling, unspecified: Secondary | ICD-10-CM

## 2021-05-03 NOTE — Telephone Encounter
Spoke with pt's daughter, walked her through [place] proxy access. She is actually here to bring me a disc with recent images to upload.

## 2021-05-06 ENCOUNTER — Non-Acute Institutional Stay: Payer: MEDICARE

## 2021-05-06 ENCOUNTER — Telehealth: Payer: MEDICARE

## 2021-05-06 NOTE — Consults
PATIENT: Erika Kline  MRN: [id number]  DOB: 20-Jan-1934  DATE OF SERVICE: 05/06/2021    REFERRING PRACTITIONER: Referral, Self  PRIMARY CARE PROVIDER: No primary care provider on file.    Subjective:     [name] is a 87 y.o. female with squamous cell carcinoma of the esophagus.  Video visit today.                      Oncologic History:    04/05/2021      Nausea and vomiting.  Weight loss.  Dysphagia.  Hospitalization.  CT CAP:    1. Rim enhancing fluid collection which measures approximately 1.0 x   3.5 cm containing air adjacent to the PEG tube insertion site in the   abdominal wall is concerning for abscess formation.  2. Interval progression of mild to moderate intrahepatic and   extrahepatic biliary dilation. The gallbladder is distended.   3. Constipation seen as moderate amount of stool within the rectum.  4. Mild-to-moderate bilateral pleural effusions.     1. A peripherally enhancing lesion is seen within the mid esophagus.   2. Stable 3 mm nodule in the right upper lobe. No new pulmonary   nodule or mass. No lymphadenopathy in the chest.   3. The findings described above include a newly detected solid   pulmonary nodule of 4 mm or smaller average diameter.        04/08/21 EGD: Esophagus:   Findings: There was a friable and firm mass at 25cm from the incisors that was partially obstructing and hemi-circumferential. There was oozing and resistance with attempted passage of the endoscope.   Procedures: Cold forceps biopsies were taken.  04/08/21 Pathology: SPECIMEN: ESOPHAGEAL MASS BIOPSY     DIAGNOSIS:   (MICROSCOPIC)     ESOPHAGEAL MASS, BIOPSY     ? ? ?-- ? SQUAMOUS CELL CARCINOMA, MODERATELY DIFFERENTIATED     COMMENT: ?Sections show minute fragmented pieces of tumor as well as two   minute strips of squamous epithelium with atypical squamous cells and   mild superficial acute inflammation. ?A PAS/d stain is negative for   mucin. ?The tumor cells are diffusely strongly positive for p40. Immunohistochemistry is performed with appropriate controls,   professional component by Dr. Remus Loffler and technical by [facility]. ?Additional studies, such immunohistochemistry, can be   performed if requested. Peer review: Dr. Deedra Ehrich concurs. ?Case discussed   with Dr. Juel Burrow.     04/10/2021   G-tube placed.  Found to have abdominal wall abscess in the area of G tube placement.                    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  PAF?on eliquis, CKD, chronic diastolic dysfunction, nonrheumatic MR sp mitraclip          Family History:          Social History:              Review of Systems:    Constitutional: No fevers, chills, weight loss or appetite changes.   Eyes: No recent blurry vision, double vision or visual changes.  ENT: No recent sinus pain or congestion. No sore throat or mouth sores.   Neck: No neck pain or stiffness.  Cardiovascular: No chest pain or pressure, no palpitations.   Pulmonary: No shortness of breath, no cough or wheezing.   Gastrointestinal: No abdominal pain, nausea, vomiting or diarrhea.  No  melena or BRBPR.   Genitourinary: No urinary frequency, urgency, or dysuria.   Musculoskeletal: No joint pain, muscle pain or back pain.   Neurologic: No headaches. No numbness, tingling or weakness.   Dermatologic: No rash, pruritus or recent skin changes.   Hematologic: No abnormal bruising or bleeding.   Endocrine: No polyuria, polydipsia, heat or cold intolerance.   Psychiatric: No depressive symptoms, no suicidal or homicidal ideation    Objective:      Vitals:         ECOG:     General: alert and oriented in place, person, time, cooperative and no distress   Skin: No rashes or lesions   Head: PERRL, EOMI, sclerae are anicteric, nose and oropharnyx are clear   Neck: no adenopathy and supple, symmetrical, trachea midline   Lungs: clear to auscultation and percussion in the anterior and posterior fields, no rales, rhonchi or wheezing.    Heart: regular rate and rhythm, S1, S2 normal, no murmur, click, rub or gallop   Abdomen: soft, non-tender, non-distended; bowel sounds normal; no masses, no hepatomegaly or splenomegaly   GU: defer exam   Musculoskeletal: Muscular strength intact, full range of motion.   Extremities: extremities without cyanosis, clubbing or edema   Pulses: 2+ and symmetric   Lymph Nodes: no palpable lympadenopathy   Back: symmetric, no curvature. ROM normal. No CVA tenderness.   Neurologic: Grossly normal, motor and sensory systems intact, no peripheral neuropathy, cranial nerves intact and symmetric, reflexes intact and symmetric              Labs Reviewed Including:  Results for orders placed or performed in visit on 06/07/15   CBC   Result Value Ref Range    White Blood Cell Count 5.20 4.16 - 9.95 x10E3/uL    Red Blood Cell Count 4.20 3.96 - 5.09 x10E6/uL    Hemoglobin 13.5 11.6 - 15.2 g/dL    Hematocrit 16.1 09.6 - 45.2 %    Mean Corpuscular Volume 95.7 79.3 - 98.6 fL    Mean Corpuscular Hemoglobin 32.1 26.4 - 33.4 pg    MCH Concentration 33.6 31.5 - 35.5 g/dL    Red Cell Distribution Width-SD 47.9 36.9 - 48.3 fL    Red Cell Distribution Width-CV 13.9 11.1 - 15.5 %    Platelet Count, Auto 186 143 - 398 x10E3/uL    Mean Platelet Volume 10.7 9.3 - 13.0 fL    Nucleated RBC%, automated 0.0 No Ref. Range %    Absolute Nucleated RBC Count 0.00 0.00 - 0.00 x10E3/uL    Neutrophil Abs (Prelim) 2.49 See Absolute Neut Ct. x10E3/uL   Differential, Automated   Result Value Ref Range    Neutrophil Percent, Auto 47.9 No Ref. Range %    Lymphocyte Percent, Auto 37.7 No Ref. Range %    Monocyte Percent, Auto 9.2 No Ref. Range %    Eosinophil Percent, Auto 4.0 No Ref. Range %    Basophil Percent, Auto 1.2 No Ref. Range %    Absolute Neut Count 2.49 1.80 - 6.90 x10E3/uL    Absolute Lymphocyte Count 1.96 1.30 - 3.40 x10E3/uL    Absolute Mono Count 0.48 0.20 - 0.80 x10E3/uL    Absolute Eos Count 0.21 0.00 - 0.50 x10E3/uL    Absolute Baso Count 0.06 0.00 - 0.10 x10E3/uL CBC & Auto Differential    Narrative    The following orders were created for panel order CBC & Auto Differential.  Procedure  Abnormality         Status                     ---------                               -----------         ------                     ZOX[[id number]]                                              Final result               Differential, Automated[[id number]]                          Final result                 Please view results for these tests on the individual orders.     Results for orders placed or performed in visit on 06/07/15   Comprehensive Metabolic Panel   Result Value Ref Range    Sodium 140 135 - 146 mmol/L    Potassium 4.7 3.6 - 5.3 mmol/L    Chloride 97 96 - 106 mmol/L    Total CO2 28 20 - 30 mmol/L    Anion Gap 15 8 - 19    Glucose 79 65 - 99 mg/dL    GFR Estimate for Non-African American 67 See GFR Additional Information    GFR Estimate for African American 78 See GFR Additional Information    GFR Additional Information See Comment     Creatinine 0.83 0.60 - 1.30 mg/dL    Urea Nitrogen 13 7 - 22 mg/dL    Calcium 9.6 8.6 - 81.1 mg/dL    Total Protein 6.5 6.1 - 8.2 g/dL    Albumin 4.2 3.9 - 5.0 g/dL    Bilirubin,Total 0.5 0.1 - 1.2 mg/dL    Alkaline Phosphatase 114 (H) 37 - 113 U/L    Aspartate Aminotransferase 31 13 - 47 U/L    Alanine Aminotransferase 14 8 - 64 U/L     No results found for: CEAG, CA125, CA2729, PSATOTAL, AFP, HCGINTABETA, CA199    No results found for: RETICCNT, FERRITIN, FE, EPO, TIBC, PHOS, MG, LDH    Other Tests Reviewed:  IMAGING REVIEWED AND INCLUDING:  Results for orders placed or performed in visit on 03/22/04   CT Abdomen Pelvis w wo Contrast    Narrative    CT OF THE ABDOMEN AND PELVIS WITHOUT AND WITH INTRAVENOUS CONTRAST    COMPARISON: None    INDICATION: This is a 87 year old female with history weight loss and   abdominal pain .    PROCEDURE: The patient was scanned at the GE HiSpeed CTi gantry   following administration of oral contrast. 5 mm sections were   obtained across the liver without intravenous contrast followed by 5   mm sections from the lung bases to the ischial tuberosities obtained   with uneventful intravenous administration of 100 cc Omnipaque 350.      FINDINGS:  There is a large mass lesion which involves the wall of   the stomach. This extends along the poster  wall the stomach from the   level of the cardiac to the body of the stomach. The mass lesion   contains oblique AP dimension of 12 cm and wall thickness of to 7 cm.   At the level of the fundus the mass extends in the medial side   multiple lateral slight wall and spares a portion of the anterior   wall the fundus. The lesion wraps over the superior wall of the   fundus. The mass is border forming with the splenic artery and the   proximal two-thirds of the splenic artery appear encased by the mass.   The lesion appears hypervascular and has prominent arterial feeding   vessels derived from the greater and lesser curvature of the stomach.   The mass involves the superior margin of the proximal gastric antrum.   The distal gastric antrum is free of tumor. The mass is contiguous   with the left gastric artery but the tumor does not frankly encased   this vessel or the celiac artery. Physiologic transit of contrast   from the stomach to the small bowel was observed. There is no gastric   outlet obstruction and no functional mechanical obstruction or ileus   is visualized at any level.There is no evidence for hepatic   metastatic disease. There is an ovoid low density lesion within the   left lobe of liver lateral superior segment 2, measuring up to 18 mm.   This is probably a benign cyst. The spleen is not enlarged.    There is no evidence for hepatic metastatic disease. The gallbladder   is surgically absent. There is moderate intrahepatic and extra   hepatic biliary dilatation. This is likely indicative of chronic   biliary ectasia. Clinical correlation with hepatic function is   advised. There slight prominence of the pancreatic duct which   measures up to 3 mm at the junction of the pancreas head and body.    There is no periaortic or pelvic adenopathy. The adrenal glands are   not enlarged. The left kidney is displaced inferiorly by the left   upper quadrant mass lesion. There is no hydronephrosis or   hydroureter. The uterus is postmenopausal in appearance for the   ovaries are not visualized. The bones are diffusely osteopenic   spontaneous reflux to fuse the lumbar spine. The bases are clear.   There is no pleural effusion. Calcified granulomata are noted   incidentally at the upper pole the spleen.        Impression    IMPRESSION:     1. There is a bulky mass lesion at gastric fundus and body measuring   up to 12 cm. The mass is of uncertain significance and differential   diagnosis should include gastric adenocarcinoma and lymphoma. Tissue   sampling is recommended.    2. The patient is status post cholecystectomy; there is mild   intrahepatic and intrahepatic biliary dilatation, which probably   reflects chronic biliary ectasia. Correlation with liver function is   suggested.    3. Old granulomatous disease with involvement of the spleen.    The preliminary verbal report this examination was given to the   referring physician, Dr. Heinz Knuckles, by voice mail at the time of the   dictation.              Assessment/Plan:     Assessment: Namrata Dangler is a 87 y.o. female with squamous cell carcinoma of the esophagus.  Video visit today.  Plan:  Squamous Cell carcinoma of the esophagus:          We discussed today extensively this patient's complicated medical and oncologic history with other physicians and allied health professionals involved in the care.     More than 50% of this 60-minutes visit was spent face-to-face in counseling. The above plan of care, diagnosis, orders, and follow-up were discussed with the patient. Questions related to this recommended plan of care were answered.

## 2021-05-07 ENCOUNTER — Telehealth: Payer: MEDICARE

## 2021-05-08 NOTE — Telephone Encounter
PDL Call to [facility] for Call:      Patient is in th ER up [facility] patient was admitted today. Patient currently have Covid.    Appointment Related?  []  Yes  [x]  No     If yes;  Date:  Time:    Call warm transferred to PDL: [x]  Yes  []  No    Call Received by Clinic Representative: Glade Stanford      If call not answered/not accepted, call received by Patient Services Representative:

## 2021-05-08 NOTE — Telephone Encounter
Accepted PDL call.    Hi Dr. Constance Holster and Luanna Cole,    Pts daughter Malachy Chamber) called to inform you that the pt is currently at [facility] in [place], pt has not yet been admitted and is asking for your help. She is requesting to speak with either MD or NP, her cb # [phone].    Thank you!

## 2021-05-09 NOTE — Telephone Encounter
PDL Call to Clinic    Reason for Call:  Pt's daughter Berline Chough) is following up on her call back request from yesterday, encounter below.    Appointment Related?  []  Yes  [x]  No     If yes;  Date:  Time:    Call warm transferred to PDL: [x]  Yes  []  No    Call Received by Clinic Representative: Elmo Putt    If call not answered/not accepted, call received by Patient Services Representative:

## 2021-05-09 NOTE — Telephone Encounter
Hi Aileen/ Dr. Constance Holster    Patients daughter wishes to speak with either of you when you can.   cb # [phone] [place]  Thank you so much  [place]

## 2021-05-10 ENCOUNTER — Non-Acute Institutional Stay: Payer: MEDICARE

## 2021-05-10 ENCOUNTER — Telehealth: Payer: MEDICARE

## 2021-05-10 DIAGNOSIS — R531 Weakness: Secondary | ICD-10-CM

## 2021-05-10 NOTE — Telephone Encounter
PDL Call to Clinic    Reason for Call:  Pt daughter is requesting to speak to Dr. Constance Holster clinical team.     CB:[phone]      Appointment Related?  []  Yes  [x]  No     If yes;  Date:  Time:    Call warm transferred to PDL: [x]  Yes  []  No    Call Received by Clinic Representative:    If call not answered/not accepted, call received by Patient Services Representative:

## 2021-05-10 NOTE — Telephone Encounter
Accepted pdl call and transferred to [facility].

## 2021-05-10 NOTE — Telephone Encounter
Message to Practice/Provider      Message:  pts daughter lauryl called to update NP on time pt should  Be arriving ED SM . Per daughter w Friday traffic they should be arriving 6pm. Please have NP reach out to ED team /onc team. If any questions please call     P [phone] Lauryl      Return call is not being requested by the patient or caller.    Patient or caller has been notified of the turnaround time of 1-2 business day(s).

## 2021-05-10 NOTE — Telephone Encounter
Handled.   

## 2021-05-10 NOTE — Telephone Encounter
Spoke with patient's daughter.  Going to ED SM.  Notified ED team and onc team.

## 2021-05-10 NOTE — Telephone Encounter
Accepted call from [facility]. Lauryl is calling on behalf of Erika Kline. She wants Dr. Constance Holster to be notified that they are on their way to [facility] ER department. They were told by Dr. Constance Holster to reach out to him if any problems occurred during the ER visit. They estimate to arrive at the hospital around 3-4 pm.

## 2021-05-11 ENCOUNTER — Non-Acute Institutional Stay: Payer: MEDICARE

## 2021-05-11 ENCOUNTER — Inpatient Hospital Stay
Admit: 2021-05-11 | Discharge: 2021-05-17 | Disposition: A | Discharge status: Home / Self Care | Payer: MEDICARE | Source: Home / Self Care

## 2021-05-11 DIAGNOSIS — I480 Paroxysmal atrial fibrillation: Secondary | ICD-10-CM

## 2021-05-11 DIAGNOSIS — C159 Malignant neoplasm of esophagus, unspecified: Secondary | ICD-10-CM

## 2021-05-11 DIAGNOSIS — I10 Essential (primary) hypertension: Secondary | ICD-10-CM

## 2021-05-11 DIAGNOSIS — R627 Adult failure to thrive: Secondary | ICD-10-CM

## 2021-05-11 DIAGNOSIS — U071 COVID-19: Secondary | ICD-10-CM

## 2021-05-11 LAB — Troponin I: TROPONIN I: <0.04 ng/mL (ref <0.04)

## 2021-05-11 LAB — Basic Metabolic Panel
CHLORIDE: 100 mmol/L (ref 96–106)
CHLORIDE: 105 mmol/L (ref 96–106)
TOTAL CO2: 26 mmol/L (ref 20–30)
UREA NITROGEN: 20 mg/dL (ref 7–22)

## 2021-05-11 LAB — Prothrombin Time Panel: PROTHROMBIN TIME: 13.1 s (ref 11.5–14.4)

## 2021-05-11 LAB — Lipase: LIPASE: 84 U/L — ABNORMAL HIGH (ref 13–69)

## 2021-05-11 LAB — Hepatic Funct Panel: ASPARTATE AMINOTRANSFERASE: 47 U/L (ref 13–62)

## 2021-05-11 LAB — Glucose,POC
GLUCOSE,POC: 104 mg/dL — ABNORMAL HIGH (ref 65–99)
GLUCOSE,POC: 112 mg/dL — ABNORMAL HIGH (ref 65–99)
GLUCOSE,POC: 126 mg/dL — ABNORMAL HIGH (ref 65–99)

## 2021-05-11 LAB — Expedited COVID-19 and Influenza A B PCR

## 2021-05-11 LAB — B-Type Natriuretic Peptide: BNP: 384 pg/mL — ABNORMAL HIGH (ref <100)

## 2021-05-11 LAB — CBC
MCH CONCENTRATION: 33.2 g/dL (ref 31.5–35.5)
PLATELET COUNT, AUTO: 236 10*3/uL (ref 143–398)

## 2021-05-11 LAB — Differential Automated: ABSOLUTE LYMPHOCYTE COUNT: 2.5 10*3/uL (ref 1.30–3.40)

## 2021-05-11 LAB — CK, Total: CREATINE PHOSPHOKINASE, TOTAL: 25 U/L — ABNORMAL LOW (ref 38–282)

## 2021-05-11 LAB — Magnesium: MAGNESIUM: 1.5 meq/L (ref 1.4–1.9)

## 2021-05-11 MED ADMIN — MORPHINE SULFATE 4 MG/ML IV SOLN: 4 mg | INTRAVENOUS | @ 04:00:00 | Stop: 2021-05-11 | NDC 00641612501

## 2021-05-11 MED ADMIN — DEXTROSE 5 %/LACTATED RINGERS: 100 mL/h | INTRAVENOUS | @ 09:00:00 | Stop: 2021-05-11 | NDC 00338012504

## 2021-05-11 MED ADMIN — PANTOPRAZOLE 40 MG/20 ML ORAL SUSPENSION: 40 mg | GASTROSTOMY | @ 18:00:00 | Stop: 2021-05-17 | NDC 00093001220

## 2021-05-11 MED ADMIN — SIMETHICONE 80 MG PO CHEW: 80 mg | ORAL | @ 23:00:00 | Stop: 2021-05-12 | NDC 24385011878

## 2021-05-11 MED ADMIN — FENTANYL 12 MCG/HR TD PT72: 1 | TRANSDERMAL | @ 14:00:00 | Stop: 2021-05-11

## 2021-05-11 MED ADMIN — METOPROLOL TARTRATE 12.5 MG PO TABS: 12.5 mg | GASTROSTOMY | @ 18:00:00 | Stop: 2021-05-17 | NDC 57237010001

## 2021-05-11 MED ADMIN — SODIUM CHLORIDE 0.9 % IV BOLUS: 1000 mL | INTRAVENOUS | @ 04:00:00 | Stop: 2021-05-11 | NDC 00338004904

## 2021-05-11 MED ADMIN — FAMOTIDINE (PF) 20 MG/2ML IV SOLN: 20 mg | INTRAVENOUS | @ 23:00:00 | Stop: 2021-05-11 | NDC 67457043300

## 2021-05-11 MED ADMIN — METOPROLOL TARTRATE 12.5 MG PO TABS: 12.5 mg | GASTROSTOMY | @ 09:00:00 | Stop: 2021-05-17 | NDC 57237010001

## 2021-05-11 MED ADMIN — ONDANSETRON HCL 4 MG/2ML IJ SOLN: 4 mg | INTRAVENOUS | @ 22:00:00 | Stop: 2021-05-12 | NDC 60505613000

## 2021-05-11 MED ADMIN — APIXABAN 2.5 MG PO TABS: 2.5 mg | GASTROSTOMY | @ 14:00:00 | Stop: 2021-05-11

## 2021-05-11 MED ADMIN — PANTOPRAZOLE 40 MG/20 ML ORAL SUSPENSION: 40 mg | GASTROSTOMY | @ 09:00:00 | Stop: 2021-06-10 | NDC 00093001220

## 2021-05-11 MED ADMIN — DEXTROSE 5 %/LACTATED RINGERS: 100 mL/h | INTRAVENOUS | @ 18:00:00 | Stop: 2021-05-11 | NDC 00338012504

## 2021-05-11 MED ADMIN — ONDANSETRON HCL 4 MG/2ML IJ SOLN: 4 mg | INTRAVENOUS | @ 04:00:00 | Stop: 2021-05-11 | NDC 60505613000

## 2021-05-11 MED ADMIN — FENTANYL 12 MCG/HR TD PT72: 1 | TRANSDERMAL | @ 19:00:00 | Stop: 2021-05-20 | NDC 00406911276

## 2021-05-11 NOTE — Progress Notes
SOLID ONCOLOGY SERVICE PROGRESS NOTE     PATIENT:  Erika Kline   DATE OF SERVICE:  05/11/2021  HOSPITAL DAY:  1  CHIEF COMPLAINT:  Failure To Thrive (Pt w/ hx of esophageal cancer, sent by Dr Pollyann Kennedy for eval and admit to solid onc. +Covid)  ADMISSION CATEGORY:  Symptom Control    Last 24 Hour/Overnight Events:   Admitted overnight. COVID +. Remained on RA. No acute events.     Subjective/Review of Systems:   Reports ongoing body aches, thinks it is from laying in bed a lot   States has been staying in bed mostly and uses diaper   She denies any shortness of breath  She reports ongoing g-tube site pain     Medications:   Scheduled:  fentaNYL, 1 patch, Transdermal, Q72H  glycopyrrolate, 1 mg, Per G Tube, QHS  metoprolol tartrate, 12.5 mg, Per G Tube, BID  pantoprazole, 40 mg, Per G Tube, BID  PRN:  acetaminophen **OR** acetaminophen suppository, melatonin oral/enteral/sublingual, ondansetron **OR** ondansetron injection/IVPB, oxyCODONE  Infusions:  ? dextrose 5%/lactated ringers 100 mL/hr (05/11/21 1027)       Physical Exam:   Temp:  [36.5 ?C (97.7 ?F)-36.9 ?C (98.4 ?F)] 36.5 ?C (97.7 ?F)  Heart Rate:  [73-87] 80  Resp:  [11-16] 16  BP: (104-124)/(61-75) 124/71  NBP Mean:  [75-89] 80  SpO2:  [93 %-97 %] 94 %  I/O:  I/O last 2 completed shifts:  In: 343.3 [I.V.:313.3; NG/GT:30]  Out: 200 [Urine:200]  General: alert, chronically ill appearing, in no distress   Head: Atraumatic, normocephalic  Eyes: pupils equal and reactive, extraocular eye movements intact, sclera anicteric.  Oropharynx: mucous membranes moist, pharynx normal without lesions.  Neck: supple, no significant adenopathy.  Heart: normal rate, regular rhythm, normal S1, S2,  Lungs: normal work of breathing, clear to auscultation with no wheezes, rales or rhonchi  Abdomen: soft, ttp around the g-tube, nondistended, normal bowel sounds. g-tube with mild surrounding erythema but no drainage  Extremities: warm and well perfused; no lower extremity edema   Skin: normal coloration and turgor, no rashes, no suspicious skin lesions noted.  Neuro: alert, oriented, normal speech    Data:   I have reviewed all of the labs from today. Pertinent labs include:   Lab Results   Component Value Date    WBC 7.30 05/11/2021    HGB 10.2 (L) 05/11/2021    HCT 31.8 (L) 05/11/2021    MCV 98.5 05/11/2021    PLT 236 05/11/2021     Lab Results   Component Value Date    CREAT 0.58 (L) 05/11/2021    BUN 16 05/11/2021    NA 136 05/11/2021    K 4.4 05/11/2021    CL 105 05/11/2021    CO2 26 05/11/2021       Problem-Based Assesment and Plan:   Erika Kline is a 87 y.o. female with recently diagnosed esophageal SCC, stomach lymphoma in remission (2005), PAF?on apixaban, chronic diastolic dysfunction, nonrheumatic MR sp mitraclip, HTN/HLD, recent COVID19 infection who presents with failure to thrive.   ?  # failure to thrive: Likely related to recently diagnosed esophageal SCC with new G tube dependence after extended hospital stay, now with ongoing weight loss and largely bed bound.   - NPO  - consult to nutrition, appreciate recs   - start tube feeds per RD rec Fibersource HN continuous   - f/u thiamin and zinc   - DC IVF  - PT/OT eval and treat   ?  #  COVID19 infection: Per daughter, patient tested positive on 12/03 during SNF outbreak. Initially with symptoms like severe diarrhea, confusion, now improving. No respiratory symptoms currently or hypoxia noted. Symptomatically seems well controlled. COVID19 positive on admission 12/9.   - supportive management   - given symptoms improving, will hold off on anti-COVID therapies for now   ?  # esophageal SCC with G tube dependence: Recently diagnosed during hospitalization 11/4. Has not started any cancer directed therapies yet. Has staging imaging (CT C/A/P w contrast) from 11/07 at OSH. Established care with Dr. Pollyann Kennedy on 12/5.  - Dr. Pollyann Kennedy notified of admission  - pain management:              - continue fentanyl 12 mcg patch Q72H, due for change today              - continue oxycodone 5 mg Q6H PRN severe pain              - continue tylenol 650 mg Q6H PRN pain  - continue glycopyrrolate 1 mg at bedtime for secretions  - continue pantoprazole 40 mg BID    ?  # Normocytic anemia: nPOA suspect due to dilutional. hgb on admission 11.9, patient received 1L fluid bolus in ED.   - trend cbc   - transfuse for hgb <8  - monitor for bleeding, pt has had scan hemoptysis prior to admission     # paroxysmal A fib: On frailty dosing apixaban outpatient. CHADSVASC 6 with hx of TIA.   - hold apixaban given scant hemoptysis reported  - continue home metoprolol tartrate 12.5 mg BID, hold SBP < 100 or HR < 50  - can resume if hgb continues to be stable   ?  # HTN: home metoprolol as above   ?  # Cognitive decline: family reports patient becoming more forgetful since her diagnosis c/f onset of dementia and requesting inpatient work up. Patient is currently a/o x 4 and is able to answer questions regarding her recent hospitalization but forgets the details. Query component of delirium from prolonged hospitalization.   - will need outpatient neurology referral on discharge for appropriate workup   - delirium precautions   - CTM    Inpatient Checklist  Orders Placed This Encounter      Diet NPO Reason for NPO: Other (specify), Aspiration risk; Please specify: tube feed dependent    DVT Prophylaxis:  sequential compression devices (SCDs)  GI Prophylaxis:  indicated due to patient is on chronic acid suppression therapy as an outpatient: Proton pump inhibitor  Central Lines:  none  Tubes/Drains:  percutaneous gastric tube      Disposition  Continue inpatient care for above. Discharge plan pending clinical course and PT recs.     Code Status  DNR (No CPR, defibrillation, intubation),  Primary Emergency Contact: Bramlett,Casey, Home Phone: [phone]    Patient was seen, examined, and plan was formulated/discussed with attending Dr.Garon, [name]., MD     Author:    Ronna Polio. Jyl Heinz, MSN, NP   05/11/2021 11:18 AM

## 2021-05-11 NOTE — Nursing Note
After starting feeding, patient experienced burping that would not resolve with sptting up frothy saliva. Zofran given due patient expressing feeling ''sick to stomach''. Patient states this happens when air gets into G-tube. However, only free water given with no air administered, feeding started. Patient also experienced this same burping when medications given this AM that resolved after only a few minutes. NP made aware this AM of the burping described here. NP paged and awaiting discussed plan of care. Orders in place to improve patient's symptoms.

## 2021-05-11 NOTE — H&P
[facility]cology History and Physical      Patient: Erika Kline  MRN: [id number]  Date of Service: 05/10/2021    Primary Care Provider: Roxanne Kline., MD      Attending Physician: Donella Stade., MD    Chief Complaint     Chief Complaint   Patient presents with   ? Failure To Thrive     Pt w/ hx of esophageal cancer, sent by Erika Kline for eval and admit to solid onc. +Covid       History of Present Illness   Erika Kline is a 87 y.o. 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     History gathered from patient and daughter.     Daughter notes that patient was recently diagnosed in November of this year (please see Onc history below). After her initial hospital admission (diagnosis, G tube placement, c/b by abscess formation) the patient went to SNF rehab on 11/30. Completed antibiotics on 11/30. Got COVID on 12/03, symptoms included diarrhea, headache, diffuse weakness, with confusion. still COVID +. Still awaiting a PET CT for staging.     Currently has occasionally scant bloody salivary output, sometimes with clots. Daughter reports recently patient has coughed up small pieces of tissue ''like fish scales'', feels may be tumor. The coughing up blood/tissue started around 11/30. Does not tolerate any oral intake (high aspiration per daughter), so everything via GT.  Recently diagnosed with COVID on 12/03, symptoms included diarrhea, headache, diffuse weakness, with confusion, all currently improving. No fevers, chills, chest pain, SOB. Patient is largely bedbound currently. Previously was very active prior to her diagnosis. On tube feeds at home (Tube feeding from 2PM to 10 AM, 65 mL/hr Jevity 1.2 cal complete balanced).     Oncologic History   The patient was hospitalized on 11/04 with nausea/vomiting, weight loss, difficulty eating/drinking. CT abd/pelvis demonstrated mid esophagela mass. Underwent EGD on 11/07 demonstrating partially obstructing friable mass, pathology consistent with SCC. G tube placed 11/09 due to inability to tolerate oral intake. Developed abscess over G tube site, treated with antibiotics with improvement.    Emergency Department Course     ED Triage Vitals   Temp Temp Source BP Heart Rate Resp SpO2 O2 Device Pain Score Weight   05/10/21 1732 05/10/21 1733 05/10/21 1733 05/10/21 1733 05/10/21 1732 05/10/21 1733 05/10/21 2015 05/10/21 1753 05/10/21 1754   36.6 ?C (97.9 ?F) Temporal 116/75 86 12 97 % None (Room air) Three 45.4 kg (100 lb)     In the ED, patient was noted to be hemodynamically stable with history and exam concerning for overall failure to thrive in setting of recent cancer diagnosis. Labs were overall unremarkable. COVID test returned positive. The patient received 1 L of NS, morphine and zofran and was subsequently admitted to [facility].     Past Medical History     Past Medical History:   Diagnosis Date   ? Allergy, unspecified not elsewhere classified    ? Arthritis    ? Basal cell carcinoma    ? Cancer (HCC/RAF)     Stomach Cancer S/P chemo   ? Depression    ? Gastric lymphoma (HCC/RAF)    ? Gastric lymphoma (HCC/RAF)    ? Gastric lymphoma (HCC/RAF)    ? Hyperlipidemia    ? Hypertension  Past Surgical History     Past Surgical History:   Procedure Laterality Date   ? breast implants  1978   ? BREAST SURGERY          Allergies     Allergies   Allergen Reactions   ? Amlodipine Besylate Rash   ? Clarithromycin Rash     Patient states ''no allergies''    Home Medications     Reviewed with daughter, who provided recent SNF paperwork     Social History     She is widowed, has five adult children, lives with her daughter Erika Kline.   Denies tobacco, alcohol, and recreational drug use.    Family History     Family History   Problem Relation Age of Onset   ? Heart attack Father    ? Cancer Sister    Daughter with leukemia. Sister with brain cancer.     Physical Exam   Temp:  [36.6 ?C (97.9 ?F)-36.7 ?C (98.1 ?F)] 36.7 ?C (98.1 ?F)  Heart Rate:  [80-86] 81  Resp:  [11-14] 11  BP: (105-116)/(61-75) 105/61  NBP Mean:  [75-89] 75  SpO2:  [95 %-97 %] 95 %   UOP:   I/Os: No intake or output data in the 24 hours ending 05/10/21 2321     Gen: Frail, cachectic female in no acute distress, conversant and talkative   Eyes: PERRL, EOMI grossly  ENT: OP clear, dry MM   Neck: Supple, trachea midline, no cervical or supraclavicular lymphadenopathy  CV: RRR, normal S1S2, no m/r/g  Pulm: CTAB in anterior lung fields, normal WOB on room air  Abd: NABS, G tube site c/d/i but with TTP around G tube site, no noted fluctuance or drainage   Skin: Warm and dry, no rashes  Ext: No cyanosis or edema. Distal pulses 2+ and symmetric  Neuro: A&Ox4, CNII-XII intact grossly, MAEE    Laboratory Data   CBC:  Recent Labs     05/10/21  1951   WBC 6.55   HGB 11.9   HCT 35.8   MCV 95.2   PLT 263     BMP:  Recent Labs     05/10/21  1949   NA 134*   K 4.6   CL 100   CO2 25   BUN 20   CREAT 0.59*   GLUCOSE 101*   CALCIUM 8.7     LFT:  Recent Labs     05/10/21  1949   TOTPRO 6.3   ALBUMIN 3.2*   BILITOT 0.3   BILICON <0.2   ALT 33   AST 47   ALKPHOS 137*   LIPASE 84*     COAGS:  Recent Labs     05/10/21  1949   INR 1.0   PT 13.1       Microbiology   Expedited COVID-19 and Influenza A/B PCR: COVID19 detected    Imaging   04/05/2021        1. A peripherally enhancing lesion is seen within the mid esophagus.   2. Stable 3 mm nodule in the right upper lobe. No new pulmonary   nodule or mass. No lymphadenopathy in the chest.   3. The findings described above include a newly detected solid   pulmonary nodule of 4 mm or smaller average diameter.    CT chest/abd/pelvis w contrast 04/08/2021:  FINDINGS:  LUNGS:Stable 3 mm nodule of the right upper lobe on image 28. Stable   scarring in the posterior aspect of  the right upper lobe. ?Stable 3   mm nodule of the lingula on image 32. Mild diffuse bronchiectasis   bilaterally. ?  HILA:No mass or adenopathy. ?  VASCULATURE:Unremarkable for this routine study. ?  CARDIAC:No pericardial effusion. ?Mild coronary artery calcifications.  AORTA:No aneurysm or visible dissection. ?  MEDIASTINUM:A peripherally enhancing mass is seen associated with the   midesophagus measuring up to 1.5 x 1.7 x 1.8 cm. ?  PLEURA:No mass or effusion. ?  CHEST WALL:Calcified bilateral breast implants. ?No mass or axillary   adenopathy. ?  LIMITED ABDOMEN:Please see the report for the dedicated concurrent   abdominal CT.  BONES:No bony lesion or fracture. ?  OTHER: ?    CONCLUSION:  1. A peripherally enhancing lesion is seen within the mid esophagus. ?  2. Stable 3 mm nodule in the right upper lobe. ?No new pulmonary   nodule or mass. ?No lymphadenopathy in the chest. ?  3. Please see the report for the dedicated concurrent abdominal CT.  4. The findings described above include a newly detected solid   pulmonary nodule of 4 mm or smaller average diameter. Guidelines from   the Fleischner Society for the follow-up and management of newly   detected indeterminate pulmonary nodules in persons >75 years old   depend on nodule size (average of length and width) and underlying   risk factors (including smoking and other risk factors). ?Please   consider the following recommendations after clinical assessment of   risk factors. For =<4 mm nodules: ?In low risk patients, no follow-up   needed. ?In high risk patients, follow-up CT at 12 months; if   unchanged, no further follow-up.    CT chest/abd/pelvis 11/14 :    1. Rim enhancing fluid collection which measures approximately 1.0 x   3.5 cm containing air adjacent to the PEG tube insertion site in the   abdominal wall is concerning for abscess formation.  2. Interval progression of mild to moderate intrahepatic and   extrahepatic biliary dilation. The gallbladder is distended.   3. Constipation seen as moderate amount of stool within the rectum.  4. Mild-to-moderate bilateral pleural effusions.  ?    Procedures:   04/08/21 EGD: Esophagus:   Findings: There was a friable and firm mass at 25cm from the incisors that was partially obstructing and hemi-circumferential. There was oozing and resistance with attempted passage of the endoscope.   Procedures: Cold forceps biopsies were taken.    Pathology     ESOPHAGEAL MASS, BIOPSY 04/08/2021    ? ? ?-- ? SQUAMOUS CELL CARCINOMA, MODERATELY DIFFERENTIATED     COMMENT: ?Sections show minute fragmented pieces of tumor as well as two   minute strips of squamous epithelium with atypical squamous cells and   mild superficial acute inflammation. ?A PAS/d stain is negative for   mucin. ?The tumor cells are diffusely strongly positive for p40.   Immunohistochemistry is performed with appropriate controls,   professional component by Erika. Remus Loffler and technical by [facility]. ?Additional studies, such immunohistochemistry, can be   performed if requested. Peer review: Erika. Deedra Ehrich concurs. ?Case discussed   with Erika. Juel Burrow.   ?    Assessment/Plan   Nella Botsford is a 87 y.o. female with PMHx of recently diagnosed esophageal SCC, stomach lymphoma in remission (2005), PAF?on apixaban, chronic diastolic dysfunction, nonrheumatic MR sp mitraclip, HTN/HLD, recent COVID19 infection who presents with failure to thrive.     # failure to thrive: Likely related to recently diagnosed  esophageal SCC with new G tube dependence after extended hospital stay, now with ongoing weight loss and largely bed bound.   - NPO  - consult to nutrition placed for tube feeding regimen   - continue accuchecks Q6H with D5LR mIVF while NPO/tube feeds held   - PT/OT consult    # COVID19 infection: Per daughter, patient tested positive on 12/03 during SNF outbreak. Initially with symptoms like severe diarrhea, confusion, now improving. No respiratory symptoms currently or hypoxia noted. Symptomatically seems well controlled. COVID19 positive on admission 12/9.   - supportive management   - can consider anti-COVID therapies (remdesivir, convalescent plasma) given significant patient comorbidities and risk factors including age, active cancer diagnosis     # esophageal SCC with G tube dependence: Recently diagnosed and re-established care with Erika. Pollyann Kline. Has not started any cancer directed therapies yet. Has staging imaging (CT C/A/P w contrast) from 11/07 at OSH  - notify Erika. Pollyann Kline of admission  - suspect will require multidisciplinary evaluation, pending Erika. Lucky Rathke preference: consider GI, rad onc, surgery   - current access: pIV only  - pain management:   - continue fentanyl 12 mcg patch Q72H, due for change today   - continue oxycodone 5 mg Q6H PRN severe pain   - continue tylenol 650 mg Q6H PRN pain  - continue glycopyrrolate 1 mg at bedtime for secretions  - continue pantoprazole 40 mg BID      # paroxysmal A fib: On frailty dosing apixaban outpatient. CHADSVASC 6 with hx of TIA.   - hold apixaban given scant hemoptysis reported   - type and screen sent   - continue home metoprolol tartrate 12.5 mg BID, hold SBP < 100 or HR < 50  - can resume pending oncological note and any plans for procedure     # HTN: home metoprolol as above     Inpatient Checklist:  Orders Placed This Encounter      Diet NPO Reason for NPO: Other (specify), Aspiration risk; Please specify: tube feed dependent    DVT Prophylaxis: sequential compression devices (SCDs)  GI Prophylaxis: indicated due to patient is on chronic acid suppression therapy as an outpatient: Proton pump inhibitor  Central Lines: none  Tubes/Drains: none    #Disposition: Pending management of acute problems including failure to thrive, recently diagnosed esophageal cancer     Code Status: DNR (No CPR, defibrillation, intubation)  Contact:  Primary Emergency ContactJadelyn, [place], Home Phone: [phone]      Author:  Donella Stade, MD  Oncology [facility]  05/10/2021 11:21 PM

## 2021-05-11 NOTE — ED Provider Notes
Erika Kline  Emergency Department Service Report    Erika Kline 87 y.o. female , presents with Failure To Thrive      Triage   Arrived on 05/10/2021 at 5:24 PM   Arrived by Wheelchair [4]    ED Triage Vitals   Temp Temp Source BP Heart Rate Resp SpO2 O2 Device Pain Score Weight   05/10/21 1732 05/10/21 1733 05/10/21 1733 05/10/21 1733 05/10/21 1732 05/10/21 1733 -- 05/10/21 1753 05/10/21 1754   36.6 ?C (97.9 ?F) Temporal 116/75 86 12 97 %  Three 45.4 kg (100 lb)       Pre hospital care:       Allergies   Allergen Reactions   ? Amlodipine Besylate Rash   ? Clarithromycin Rash       History   Erika Kline is a 87 y.o. female who presents to the ED for evaluation of losing weight for 1 week. Sx are moderate, unchanging, constant with no alleviating or exacerbating factors noted. Reports coughing blood and esophagus tissue.  Endorses decreased appetite, SOB, nausea. Per daughter, stool is no longer watery, but pt had severe diarrhea for x4-5 days. Pt has Stage 2 esophageal cancer. Pt's physician is Dr. Pollyann Kennedy to do more labs and admission. Per daughter, pt is weak. Within the last month, pt was hospitalized in [place] for 3 weeks, had surgery, G-tube was placed, and recently dx with esophagus cancer. Daughter states she has an abscess due to the surgery and was ''belching blood''. While pt was in rehab, she got COVID in the last 10 days.Takes eliquis. PMH of stomach cancer. Daughter is present and is the historian.     The history is provided by a relative. No language interpreter was used.   Illness   Episode onset: 1 week  The onset was gradual. The problem occurs rarely. The problem has been unchanged. The problem is moderate. Nothing relieves the symptoms. Nothing aggravates the symptoms. Associated symptoms include diarrhea, nausea and cough. Pertinent negatives include no fever, no abdominal pain, no vomiting, no congestion, no sore throat, no neck pain and no rash. Associated symptoms comments: Losing weight. . She has been eating less than usual and drinking less than usual.            Past Medical History:   Diagnosis Date   ? Allergy, unspecified not elsewhere classified    ? Arthritis    ? Basal cell carcinoma    ? Cancer (HCC/RAF)     Stomach Cancer S/P chemo   ? Depression    ? Gastric lymphoma (HCC/RAF)    ? Gastric lymphoma (HCC/RAF)    ? Gastric lymphoma (HCC/RAF)    ? Hyperlipidemia    ? Hypertension         Past Surgical History:   Procedure Laterality Date   ? breast implants  1978   ? BREAST SURGERY          Past Family History   family history includes Cancer in her sister; Heart attack in her father.                 Past Social History   she reports that she has never smoked. She does not have any smokeless tobacco history on file. She reports that she does not drink alcohol, does not use drugs, and does not engage in sexual activity.     Review of Systems   Constitutional: Positive for activity change and appetite change. Negative for fever.  HENT: Negative for congestion and sore throat.    Eyes: Negative for visual disturbance.   Respiratory: Positive for cough and shortness of breath. Negative for chest tightness.    Cardiovascular: Negative for chest pain.   Gastrointestinal: Positive for diarrhea and nausea. Negative for abdominal pain and vomiting.   Genitourinary: Negative for dysuria.   Musculoskeletal: Negative for back pain and neck pain.   Skin: Negative for rash.   Neurological: Negative for dizziness.   Psychiatric/Behavioral: The patient is not nervous/anxious.        Physical Exam   Physical Exam  Vitals and nursing note reviewed.   Constitutional:       Appearance: She is well-developed. She is cachectic.      Comments: Moderate distress. Well nourished 86 y.o. female      HENT:      Head: Normocephalic and atraumatic.      Nose: Nose normal.   Eyes:      Conjunctiva/sclera: Conjunctivae normal.   Cardiovascular:      Rate and Rhythm: Normal rate and regular rhythm.      Heart sounds: Normal heart sounds.   Pulmonary:      Effort: Pulmonary effort is normal. No respiratory distress.      Breath sounds: Normal breath sounds. No wheezing.   Abdominal:      General: There is no distension.      Palpations: Abdomen is soft.      Tenderness: There is no abdominal tenderness.      Comments: G-tube in place, clean, dry.    Musculoskeletal:         General: No tenderness. Normal range of motion.      Cervical back: Normal range of motion and neck supple.      Comments: No pitting edema in BLE.   Lymphadenopathy:      Cervical: No cervical adenopathy.   Skin:     General: Skin is warm and dry.      Findings: No rash.   Neurological:      General: No focal deficit present.      Mental Status: She is alert and oriented to person, place, and time.      Cranial Nerves: No cranial nerve deficit.   Psychiatric:         Mood and Affect: Mood normal.         Behavior: Behavior normal.         ED Course          Laboratory Results   Labs Reviewed - No data to display    Imaging Results     XR chest ap portable (1 view)   Final Result by Estill Cotta., MD (12/09 1811)   IMPRESSION:   Bilateral peripherally calcified breast implants partially obscure visualization of the lower lung fields, left greater than right.   Faint right upper lobe opacities suspicious for an active infectious/inflammatory process. Right upper lobe micronodule nodule on prior CT difficult to identify with certainty. Small calcified granuloma left upper lobe. Left basilar atelectasis and/or    scarring.   Thoracic aorta tortuous with calcite plaque. Heart size is stable. Metal density cardiac implant is stable in positioning, suggesting a mitral.   No acute fracture.      Signed by: Ilda Mori   05/10/2021 6:11 PM          Administered Medications     Medication Administration from 05/10/2021 1724 to 05/10/2021 1842     None  Procedures   Procedural Sedation  ED ECG interpretation    Date/Time: 05/10/2021 6:53 PM  Performed by: Gwyndolyn Kaufman., MD  Authorized by: Gwyndolyn Kaufman., MD     ECG reviewed by ED Physician in the absence of a cardiologist: yes    Interpretation:     Interpretation: non-specific    Rate:     ECG rate:  80    ECG rate assessment: normal    Rhythm:     Rhythm: sinus rhythm    Ectopy:     Ectopy: none    QRS:     QRS axis:  Normal    QRS conduction: normal    ST segments:     ST segments:  Normal  T waves:     T waves: non-specific and inverted    Comments:      No acute ST or T wave changes.    Critical Care  Performed by: Gwyndolyn Kaufman., MD  Authorized by: Gwyndolyn Kaufman., MD   Total critical care time: 45 minutes  Critical care time was exclusive of separately billable procedures and treating other patients.  Critical care was necessary to treat or prevent imminent or life-threatening deterioration of the following conditions: metabolic crisis.  Critical care was time spent personally by me on the following activities: development of treatment plan with patient or surrogate, discussions with consultants, gastric intubation, interpretation of cardiac output measurements, evaluation of patient's response to treatment, examination of patient, obtaining history from patient or surrogate, ordering and review of laboratory studies, ordering and review of radiographic studies, pulse oximetry, ordering and performing treatments and interventions, re-evaluation of patient's condition, discussions with primary provider, review of old charts and blood draw for specimens.        Rhythm Strip Interpretation  05/10/2021  7:41 PM  Normal sinus rhythm rate of 86 bpm.      MDM     1904: Consult with Dr. Kalman Drape, Solid Onc about patient's symptoms and exam findings. Will admit pt.       Johnston Ebbs presented with ***  Review of records reveals: ***  ED Course and plan of care: ***        Clinical Impression   No diagnosis found.    Prescriptions     New Prescriptions    No medications on file       Disposition and Follow-up   Disposition: Refresh note to pull in Disposition    No future appointments.    Follow up with:  No follow-up provider specified.    Return precautions are specified on After Visit Summary.      The documentation on this chart was performed by Ladona Horns, scribed for Gwyndolyn Kaufman., MD    05/10/2021 6:55 PM     ***

## 2021-05-11 NOTE — Other
Patient's Clinical Goal:   Clinical Goal(s) for the Shift: monitor nutrition, safety, rest, VSS, safety  Identify possible barriers to advancing the care plan: none  Stability of the patient: Moderately Stable - low risk of patient condition declining or worsening   Progression of Patient's Clinical Goal: VSS on RA. On Tele. NSR. Bedbound for now. PEG Tube is clean dry and intact. Voiding via primafit due to incontinent. 1 loose BM during shift. Covid Positive. Repositioned every 2 hours. No skin breakdown noted. Pending PT/OT and nutrition consult. All needs met     Last Recorded Vital Signs:    05/11/21 0223   BP: 118/68   Pulse: 73   Resp: 15   Temp: 36.9 C (98.4 F)   SpO2: 93%

## 2021-05-11 NOTE — Consults
NUTRITION ASSESSMENT (Adult)    Admit Date: 05/10/2021 Date of Birth: 10-18-1933 Gender: female MRN: [id number]     Date of Assessment: 05/11/2021   Status: MD Consult: failure to thrive, tube feeding. tube feeding regimen, recent G tube insertion, daughter [name] has specifics of home tube feeding regimen     Indication: BMI < or equal to 18.5, Enteral nutrition   Subjective: Patient positive for COVID-19 and in isolation. Bolus feeding over last 2 days. Typically on machine from 2pm-10am: receiving Jevity 1.2 at 65 ml/hr. Bolus feeds: 5 cartons per day at 2pm, 5pm, 8pm, 11pm, 8am. Previously on continuous feeds for the past 4 weeks and was tolerating fine per daughter. FWF of 30ml water before and after each feed. Patient not eating or drinking by mouth. Patient gets nauseous before and after feeds and when she is moved in the bed. Previously with severe diarrhea PTA - very watery stool. Per daughter, stools have improved and are soft but still has diarrhea. Reported weight loss from 94 lbs --> 100 lbs --> 96 lbs. Daughter expressed concern about recent weight loss and different formula at the hospital. Daughter expressed that continuous feeds may be better and easier to do in the hospital.     Problems: Principal Problem:    Failure to thrive in adult POA: Yes  Active Problems:    Weakness generalized POA: Yes     MD H&P 05/10/2021: 87 y.o. 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   ?  History gathered from patient and daughter.   ?  Daughter notes that patient was recently diagnosed in November of this year (please see Onc history below). After her initial hospital admission (diagnosis, G tube placement, c/b by abscess formation) the patient went to SNF rehab on 11/30. Completed antibiotics on 11/30. Got COVID on 12/03, symptoms included diarrhea, headache, diffuse weakness, with confusion. still COVID +. Still awaiting a PET CT for staging.   ?  Currently has occasionally scant bloody salivary output, sometimes with clots. Daughter reports recently patient has coughed up small pieces of tissue ''like fish scales'', feels may be tumor. The coughing up blood/tissue started around 11/30. Does not tolerate any oral intake (high aspiration per daughter), so everything via GT.  Recently diagnosed with COVID on 12/03, symptoms included diarrhea, headache, diffuse weakness, with confusion, all currently improving. No fevers, chills, chest pain, SOB. Patient is largely bedbound currently. Previously was very active prior to her diagnosis. On tube feeds at home (Tube feeding from 2PM to 10 AM, 65 mL/hr Jevity 1.2 cal complete balanced).     Past Medical History:   Diagnosis Date   ? Allergy, unspecified not elsewhere classified    ? Arthritis    ? Basal cell carcinoma    ? Cancer (HCC/RAF)     Stomach Cancer S/P chemo   ? Depression    ? Gastric lymphoma (HCC/RAF)    ? Gastric lymphoma (HCC/RAF)    ? Gastric lymphoma (HCC/RAF)    ? Hyperlipidemia    ? Hypertension     Past Surgical History:   Procedure Laterality Date   ? breast implants  1978   ? BREAST SURGERY             Data   Intake/Outputs: I/O last 2 completed shifts:  In: 343.3 [I.V.:313.3; NG/GT:30]  Out: 200 [  Urine:200]    Pertinent Medications:   Scheduled Meds:  ? fentaNYL  1 patch Transdermal Q72H   ? glycopyrrolate  1 mg Per G Tube QHS   ? metoprolol tartrate  12.5 mg Per G Tube BID   ? pantoprazole  40 mg Per G Tube BID     Continuous Infusions:  ? dextrose 5%/lactated ringers 100 mL/hr (05/11/21 1027)     PRN Meds:.acetaminophen **OR** acetaminophen suppository, melatonin oral/enteral/sublingual, ondansetron **OR** ondansetron injection/IVPB, oxyCODONE    FDI Target Drugs: No      Pertinent Labs:   Recent Labs     05/11/21  0558 05/10/21  1951   NA 136 134*   K 4.4 4.6   BUN 16 20   CREAT 0.58* 0.59*   GLUCOSE 93 101*   MG 1.5  --    CALCIUM 8.1* 8.7   ALBUMIN  --  3.2*   WBC 7.30 6.55   HGB 10.2* 11.9   HCT 31.8* 35.8   BILITOT  --  0.3   AST  --  47   ALT  --  33   ALKPHOS  --  137*   LIPASE  --  84*     Accu-Chek:   Recent Labs     05/11/21  0102   GLUCOSEPOC 126*     Respiratory Status / O2 Device: None (Room air)    Pressure Injury:     Patient Lines/Drains/Airways Status     Active Pressure Ulcers     None            Additional Data: N/A     Diet Info   ? Allergies:   Amlodipine besylate and Clarithromycin  ? Cultural/Ethnic/Religious/Other Food Preferences:  None     ? Nutrition prior to admit:  TF from 2pm-10am: receiving Jevity 1.2 at 65 ml/hr. Bolus feeds: 5 cartons per day at 2pm, 5pm, 8pm, 11pm, 8am. Provides: 1185 ml formula, 1425 kcals, 66 g protein, and 955 ml free water. Previously on continuous feeds for the past 4 weeks and was tolerating fine per daughter. FWF of 30ml water before and after each feed.  ? Current diet order:     Diets/Supplements/Feeds   Diet    Diet NPO Reason for NPO: Other (specify), Aspiration risk; Please specify: tube feed dependent     Start Date/Time: 05/10/21 2200      Number of Occurrences: Until Specified     Order Questions:     ? Reason for NPO Other (specify)     ? Reason for NPO Aspiration risk     ? Please specify tube feed dependent     ? PO % consumed: NPO  ? Parenteral Nutrition: N/A  ? Enteral Nutrition: Nutrition consulted for recs  ? Other caloric sources: N/A    Anthropometrics:  Height: 165.1 cm (5' 5'')  Admit Weight: 45.4 kg (100 lb) (05/10/21 1754) Last 5 recorded weights:  Weights 06/07/2015 08/28/2015 10/04/2015 05/10/2021 05/11/2021   Weight 50.8 kg 50.3 kg 48.7 kg 45.4 kg 43.9 kg            IBW: 56.7 kg (125 lb)  % Ideal Body Weight: 77 %  BMI (Calculated): 16.11             Wt Readings from Last 15 Encounters:   05/11/21 (!) 43.9 kg (96 lb 12.8 oz)   10/04/15 48.7 kg (107 lb 6.4 oz)   08/28/15 50.3 kg (111 lb) 06/07/15 50.8 kg (112 lb)  05/17/13 49.9 kg (110 lb)   01/06/13 47.2 kg (104 lb)   11/25/12 48.1 kg (106 lb)   04/07/12 49.9 kg (110 lb)   03/05/12 50.9 kg (112 lb 3.2 oz)   02/05/12 50.4 kg (111 lb 3.2 oz)   10/14/11 50.9 kg (112 lb 3.4 oz)      Estimated Nutrition Needs   Based on current weight of 43.9 kg  1537-1756 kcals/day (35-40 kcals/kg)  66-88 g protein/day (1.5-2.0 g protein/kg)     Diet Education   Teaching provided (Refer to Patient Education records)        05/11/2021: discussed tube feeding regimen: continuous vs bolus in-house; discussed tube feeding formula (explained that we do not carry Jevity 1.2; however, we do have a formula very similar to it that we can trial). Daughter expressed understanding and had no further questions at this time.     Malnutrition Assessment   Malnutrition in the Context of: Chronic Illness/Mild-Moderate Inflammation   Energy Intake: Does not meet criterion  Weight Loss: Does not meet criterion    Nutrition-Focused Physical Exam: 05/11/2021  Not performed: as patient positive for COVID-19; deferred in an effort to limit contact and mitigate the spread of COVID-19.      Patient meets criteria for: Unable to complete Malnutrition assessment at this time    Nutrition Assessment   Anthropometrics: Body mass index is 16.11 kg/m?Marland Kitchen Indicating very low for age; desired range of BMI >23 and <30 for Geriatric patient. No clinically significant weight changes per EMR. Daughter endorses weight loss within past week. Patient previously 100 lbs ~7 days ago per daughter. Unable to complete NFPE at this time.    Nutrition: NPO. Nutrition consulted for TF recs.  TF from 2pm-10am: receiving Jevity 1.2 at 65 ml/hr. Bolus feeds: 5 cartons per day at 2pm, 5pm, 8pm, 11pm, 8am. Provides: 1185 ml formula, 1425 kcals, 66 g protein, and 955 ml free water. Previously on continuous feeds for the past 4 weeks and was tolerating fine per daughter. FWF of 30ml water before and after each feed. Patient meeting 93% of estimated lower energy needs and 100% of estimated lower protein needs on home TF regimen.    Tolerance/GI: Per RN flowsheet: Abdomen Inspection: Flat, Soft. Last bowel movement this morning - loose stool per RN flowsheets. Endorses nausea and diarrhea. With very watery stools since COVID-19 infection.    Skin: intact    Labs: Glucose POC x 24 hours: 126  Low Cr, [place]    Meds: pantoprazole     Recommendations / Care Plan      1. Enteral Nutrition Recs: Initiate Fibersource HN at 84ml/hr, advance by 15 ml/hr q8h to goal 60 ml/hr x 24 hours   = 1440 ml formula, 1728 kcals, 78 g protein, 1164 ml free water, and meets >100% DRIs for key vitamins and minerals   - Free water flushes per MD or 140 ml q6h   - Monitor phos, magnesium and K+ as nutrition support advanced at least BID, replace prn; follow magnesium closely as is a cofactor for thiamine    2. Rec'd check Vitamin B1(thiamine) Wh Blood and updated Vitamin D level to assess need for supplementation    3. Rec'd check Zinc d/t prolonged diarrhea   - If low, rec'd 220mg  zinc sulfate x 14 days then discontinue and recheck levels to assess need for further supplementation.    4. Monitor EN intake/tolerance, 'lytes/BG/labs, frequency and consistency of bowel movements, and weight trends  5. Obtain weekly weights as able please    Will follow-up per nutrition protocol      Author: Osie Bond, MS, RDN Weekend Pager [phone]  05/11/2021 10:36 AM

## 2021-05-11 NOTE — Telephone Encounter
Call Back Request      Reason for call back:  Per patient's daughter, Lauryl advised that Dr. Constance Holster suggested that she take the patient to the ED and the patient is currently at [facility] ED, however they mentioned that they may not have a bed for the patient, therefore Lauryl is unsure what to do next.    Lauryl Cbn: [phone]    Any Symptoms:  []  Yes  []  No       If yes, what symptoms are you experiencing:    o Duration of symptoms (how long):    o Have you taken medication for symptoms (OTC or Rx):      If call was taken outside of clinic hours:    [] Patient or caller has been notified that this message was sent outside of normal clinic hours.     [x] Patient or caller has been warm transferred to the physician's answering service. If applicable, patient or caller informed to please call us back if symptoms progress.  Patient or caller has been notified of the turnaround time of 1-2 business day(s).

## 2021-05-11 NOTE — Nursing Note
Staging scope  Slides

## 2021-05-11 NOTE — ED Notes
Report given to [facility], admitting RN for [phone], via phone for continuity of care. Admitting RN denies further questions at the moment.

## 2021-05-12 DIAGNOSIS — C8599 Non-Hodgkin lymphoma, unspecified, extranodal and solid organ sites: Secondary | ICD-10-CM

## 2021-05-12 LAB — Differential Automated: ABSOLUTE LYMPHOCYTE COUNT: 1.8 10*3/uL (ref 1.30–3.40)

## 2021-05-12 LAB — Phosphorus: PHOSPHORUS: 3.2 mg/dL (ref 2.3–4.4)

## 2021-05-12 LAB — Basic Metabolic Panel
CHLORIDE: 102 mmol/L (ref 96–106)
UREA NITROGEN: 11 mg/dL (ref 7–22)

## 2021-05-12 LAB — CBC: MEAN CORPUSCULAR VOLUME: 97.2 fL (ref 79.3–98.6)

## 2021-05-12 LAB — Glucose,POC
GLUCOSE,POC: 94 mg/dL (ref 65–99)
GLUCOSE,POC: 101 mg/dL — ABNORMAL HIGH (ref 65–99)

## 2021-05-12 LAB — CEA: CARCINOEMBRYONIC ANTIGEN: 4.7 ng/mL — ABNORMAL HIGH (ref <3.1)

## 2021-05-12 LAB — CA 19-9: CA 19-9: 28 U/mL (ref 0–35)

## 2021-05-12 LAB — MRSA Surveillance

## 2021-05-12 LAB — Magnesium: MAGNESIUM: 1.9 meq/L (ref 1.4–1.9)

## 2021-05-12 MED ADMIN — MAGNESIUM SULFATE 2 GM/50ML IV SOLN: 2 g | INTRAVENOUS | @ 03:00:00 | Stop: 2021-05-12 | NDC 25021061281

## 2021-05-12 MED ADMIN — FAMOTIDINE (PF) 20 MG/2ML IV SOLN: 20 mg | INTRAVENOUS | @ 21:00:00 | Stop: 2021-05-12 | NDC 67457043300

## 2021-05-12 MED ADMIN — OXYCODONE HCL 5 MG/5ML PO SOLN: 5 mg | GASTROSTOMY | @ 03:00:00 | Stop: 2021-05-18 | NDC 00121482705

## 2021-05-12 MED ADMIN — IOHEXOL 350 MG/ML IV SOLN: 85 mL | INTRAVENOUS | @ 08:00:00 | Stop: 2021-05-12 | NDC 00407141491

## 2021-05-12 MED ADMIN — GLYCOPYRROLATE 1 MG PO TABS: 1 mg | GASTROSTOMY | @ 04:00:00 | Stop: 2021-05-17 | NDC 00904670961

## 2021-05-12 MED ADMIN — PANTOPRAZOLE 40 MG/20 ML ORAL SUSPENSION: 40 mg | GASTROSTOMY | @ 04:00:00 | Stop: 2021-06-10 | NDC 00093001220

## 2021-05-12 MED ADMIN — ONDANSETRON HCL 4 MG/2ML IJ SOLN: 4 mg | INTRAVENOUS | @ 19:00:00 | Stop: 2021-05-12 | NDC 60505613000

## 2021-05-12 MED ADMIN — APIXABAN 2.5 MG PO TABS: 2.5 mg | ORAL | @ 17:00:00 | Stop: 2021-05-17 | NDC 00003089331

## 2021-05-12 MED ADMIN — FAMOTIDINE (PF) 20 MG/2ML IV SOLN: 20 mg | INTRAVENOUS | @ 09:00:00 | Stop: 2021-05-12 | NDC 67457043300

## 2021-05-12 MED ADMIN — LIDOCAINE 5 % EX PTCH: 2 | TRANSDERMAL | @ 22:00:00 | Stop: 2021-05-17 | NDC 63481068706

## 2021-05-12 MED ADMIN — CARBOXYMETHYLCELLULOSE SOD PF 0.5 % OP SOLN: 1 [drp] | OPHTHALMIC | @ 22:00:00 | Stop: 2021-05-13 | NDC 00023040330

## 2021-05-12 MED ADMIN — METOPROLOL TARTRATE 12.5 MG PO TABS: 12.5 mg | GASTROSTOMY | @ 17:00:00 | Stop: 2021-06-10 | NDC 57237010001

## 2021-05-12 MED ADMIN — OXYCODONE HCL 5 MG/5ML PO SOLN: 5 mg | GASTROSTOMY | @ 21:00:00 | Stop: 2021-05-17 | NDC 00121482705

## 2021-05-12 MED ADMIN — PANTOPRAZOLE 40 MG/20 ML ORAL SUSPENSION: 40 mg | GASTROSTOMY | @ 17:00:00 | Stop: 2021-06-10 | NDC 00093001220

## 2021-05-12 MED ADMIN — OXYCODONE HCL 5 MG/5ML PO SOLN: 5 mg | GASTROSTOMY | @ 03:00:00 | Stop: 2021-05-17

## 2021-05-12 MED ADMIN — MAGNESIUM SULFATE 2 GM/50ML IV SOLN: 2 g | INTRAVENOUS | @ 03:00:00 | Stop: 2021-05-12

## 2021-05-12 MED ADMIN — METOPROLOL TARTRATE 12.5 MG PO TABS: 12.5 mg | GASTROSTOMY | @ 04:00:00 | Stop: 2021-05-17 | NDC 57237010001

## 2021-05-12 MED ADMIN — SIMETHICONE 80 MG PO CHEW: 80 mg | ORAL | @ 01:00:00 | Stop: 2021-05-12 | NDC 24385011878

## 2021-05-12 MED ADMIN — SIMETHICONE 80 MG PO CHEW: 80 mg | GASTROSTOMY | Stop: 2021-05-17 | NDC 24385011878

## 2021-05-12 NOTE — Other
Patient's Clinical Goal:   Clinical Goal(s) for the Shift: vital signs stable, plan of care discussed, free of fall, tolerating tube feeding  Identify possible barriers to advancing the care plan: None  Stability of the patient: Moderately Stable - low risk of patient condition declining or worsening   Progression of Patient's Clinical Goal: Alert and oriented x 4. Skin is warm and dry to touch. Respirations are even and unlabored. Patient had a restful night. No complaints voiced. Call bell within reach. Will endorse care to day shift nurse.

## 2021-05-12 NOTE — Other
Patient's Clinical Goal:   Clinical Goal(s) for the Shift: vital signs stable, plan of care discussed, free of fall, tolerating tube feeding  Identify possible barriers to advancing the care plan: forgetful  Stability of the patient: Moderately Unstable - medium risk of patient condition declining or worsening    Progression of Patient's Clinical Goal:   Patient remains alert and oriented x4  Vital signs stable  Pain in abdomen accompanied by burping improved with pepcid IV and simethicone via g-tube.   Medication administration discussed with patient.   Family updated via telephone, notified NP that family requests update.   Patient is voiding well, unable to measure due to incontinence and unable keep primafit in place. 9 bowel movements this shift, awaiting sample. Unable to collect due to consistency and mixed with urine.   BMAT 2, patient able to assist with turning, educated on importance of preventing skin breakdown and frequent turns, patient declined turning on side despite education. Mepilex in place.     Magnesium supplemented IV, new IV placed today    Patient has call light within reach, fall precautions initiated and discussed. Rounded frequently. Patient verbalizes understanding with teachback. All questions and concerns addressed.  Implemented nursing interventions throughout shift for patient care. Patient with no concerns or questions at this time. Hourly rounding complete to ensure patient safety, comfort and needs are met.     Plan of care:   Pending CT scan  Pending stool sample  Increasing tube feeding per order until at goal.

## 2021-05-12 NOTE — Consults
Physical Therapy Evaluation      PATIENT: Erika Kline  MRN: [id number]  DOB: Sep 15, 1933    ADMIT DATE: 05/10/2021       Date of Evaluation: 05/12/2021    Problems: Principal Problem:    Failure to thrive in adult POA: Yes  Active Problems:    Weakness generalized POA: Yes       Past Medical History:   Diagnosis Date   ? Allergy, unspecified not elsewhere classified    ? Arthritis    ? Basal cell carcinoma    ? Cancer (HCC/RAF)     Stomach Cancer S/P chemo   ? Depression    ? Gastric lymphoma (HCC/RAF)    ? Gastric lymphoma (HCC/RAF)    ? Gastric lymphoma (HCC/RAF)    ? Hyperlipidemia    ? Hypertension     Past Surgical History:   Procedure Laterality Date   ? breast implants  1978   ? BREAST SURGERY          Relevant Hospital Course: Erika Kline is a 87 y.o. 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     After her initial hospital admission (diagnosis, G tube placement, c/b by abscess formation) the patient went to SNF rehab on 11/30. Completed antibiotics on 11/30. Got COVID on 12/03, symptoms included diarrhea, headache, diffuse weakness, with confusion. still COVID +.    Patient Stated Goal: to go home     Living Arrangements   Type of Home: House (house in [place])  Home Layout: One level  Bathroom Shower/Tub: Medical sales representative: Standard  Home Equipment: None    Prior Level of Function   Level of Independence: Independent, Community ambulation (According to patient's daughter, she was very active prior to her esophageal CA diagnosis)  Lives With: Alone  Support Available: Family (Patient's daughters are willing to look for caregiver services if needed)  # of hours available: Patient's daughter lives next door and able to assist as needed.  ADL Assistance: Independent  Homemaking Assistance: Independent  Vocation: Retired  Vision: Within Systems developer  Hearing: Within Functional Limits    Precautions   Precautions: Dance movement psychotherapist;Check Labs;Isolation (COVID+)  Orthotic: None  Current Activity Order: Order implies OOB  Weight Bearing Status: Not Applicable    GENERAL EVALUATION   Position: In bed;Bed alarm on;w/RN  Lines/devices Drains: HLIV;G/J/Peg tube;Cardiac Monitor;Pulse Ox     Bed Mobility   Supine Scooting: Stand by Assist  Rolling: Stand by Assist  Supine to Sit: Stand by Assist  Sit to Supine: Stand by Assist    Functional Mobility   Sit to Stand: Contact Guard Assist;Stand by Assist  Ambulation: Stand by Assist;Contact Guard Assist  Ambulation Distance (Feet): 59ft within room  Gait Pattern: Shuffled;Decreased arm swing;Decreased stride length;Decreased pace;Decreased weight shift;Narrow Base of Support  Assistive Device: Front wheeled walker         LE Assessment   RLE Assessment: Within Functional Limits                 LLE Assessment: Within Functional Limits                 Sensation   Sensation: Grossly intact    Cognition   Cognition: Exceptions to WDL  Arousal/Alertness: Appropriate responses to stimuli  Attention Span: Attends with cues to redirect  Memory: Decreased recall of recent events  Orientation Level: Oriented to place;Oriented to situation;Oriented to person  Following Commands: Follows one step commands with repetition;Follows one step commands with increased time  Safety Awareness: Fair awareness of safety precautions  Barriers to Learning: Readiness to Learn        Pain Assessment   Patient complains of pain: No                                  Patient Status   Activity Tolerance: Poor  Oxygen Needs: Room Air  Response to Treatment: Fatigued;with activity;Resolved with rest;Nursing notified  Compliance with Precautions: Not Applicable  Call light in reach: Yes  Presentation post treatment: In bed;On cardiac monitor;Lines/drains intact;Bed alarm on;Pulse Ox (with carepartner)  Comments: Donned COVID appropriate PPE prior to entering patient room. Patient requested for PT to speak with patient's daughter in regards to plan of care. Obtained PLOF and current situation from daughter. Patient required encouragement from PT and daughter to participate with PT. Patient demonstrated good overall mobility, but poor functional endurance and easily fatigued. Patient educated with pursed lip breathing. Recommend patient be up in chair for meals.    Interdisciplinary Communication   Interdisciplinary Communication: Nurse;Occupational Therapist      ASSESSMENT   Rehab Potential: Good  Inpatient Recommendation: PT treatment  Problem List: Decreased bed mobility;Decreased transfers;Decreased gait;Stairs;Decreased endurance;Decreased strength;Decreased range of motion;Decreased activity tolerance;Impaired balance;Fall risk  Treatment Plan: Bed mobility training;Transfer training;Gait training;Stair training;Therapeutic exercise;Range of motion;Balance training  Frequency: 3-5 x/week  Duration (days): 30  Progress Note Due Date: 05/19/21      Goals Discussed With: Patient    Short Term Goals to be achieved in: 14 days  Pt will transfer to/from bed/chair: with supervision;with FWW  Pt will ambulate: 31-50 feet;with FWW;with supervision  Pt will go up/down stairs: 1-2 stairs;with hand held assist;with minimum assist    Long Term Goals to be achieved in: 30 days  Pt will ambulate: 101-150 feet;with FWW;with supervision  Pt will go up/down stairs: 1-2 stairs;with hand held assist;with supervision    PT Recommendations   Discharge Recommendation: Physical Therapy;Would benefit from continued therapy  Discharge concerns: Requires supervision for mobility  Discharge Equipment Recommended: Walker;Shower Chair      Evaluation Completed by: Eligha Bridegroom, PT,  05/12/2021

## 2021-05-12 NOTE — Nursing Note
Patient transferred to CT

## 2021-05-12 NOTE — Progress Notes
SOLID ONCOLOGY SERVICE PROGRESS NOTE     PATIENT:  Erika Kline   DATE OF SERVICE:  05/12/2021  HOSPITAL DAY:  2  CHIEF COMPLAINT:  Failure To Thrive (Pt w/ hx of esophageal cancer, sent by Dr Pollyann Kennedy for eval and admit to solid onc. +Covid)  ADMISSION CATEGORY:  Symptom Control    Last 24 Hour/Overnight Events:   - CT chest (12/11): slightly enlarged mid esophageal posterior eccentric mass with circumferential esophageal wall invasion, currently in total 19 x 19 mm (3-47)-previously 19 x 14 mm (4-30)  - CTAP (12/11): no evidence of metastatic disease   - TF  increased 38ml/hr  - No acute events overnight    Subjective/Review of Systems:   - Reports ongoing generalized body aches & g-tube site pain, lower back pain, suspects d/t bed  - Endorsed gas pain overnight, however improved this am  - Unclear if she has any further diarrhea  - Denies CP, SOB, vomiting, constipation, or abd pain     Medications:   Scheduled:  apixaban, 2.5 mg, Oral, BID  fentaNYL, 1 patch, Transdermal, Q72H  glycopyrrolate, 1 mg, Per G Tube, QHS  metoprolol tartrate, 12.5 mg, Per G Tube, BID  pantoprazole, 40 mg, Per G Tube, BID  PRN:  acetaminophen **OR** acetaminophen suppository, famotidine, melatonin oral/enteral/sublingual, ondansetron **OR** ondansetron injection/IVPB, oxyCODONE, simethicone  Infusions:    Physical Exam:   Temp:  [36.4 ?C (97.6 ?F)-37.4 ?C (99.3 ?F)] 36.7 ?C (98 ?F)  Heart Rate:  [68-78] 68  Resp:  [16-17] 17  BP: (105-128)/(61-80) 113/78  NBP Mean:  [75-92] 88  SpO2:  [93 %-96 %] 94 %  I/O:  I/O last 2 completed shifts:  In: 500 [NG/GT:450; IV Piggyback:50]  Out: 325 [Urine:325]  General: alert, chronically ill appearing, NAD  Head: Atraumatic, normocephalic, PERRL, EOMI, mmm, pharynx normal without lesions.  Neck: supple, no significant adenopathy.  Heart: RRR normal S1, S2,  Lungs: normal work of breathing,clear to auscultation with no wheezes, rales or rhonchi  Abdomen: soft, ttp around the g-tube, nondistended, normal bowel sounds. g-tube with mild surrounding erythema but no drainage  Extremities: warm and well perfused; no lower extremity edema   Skin: normal coloration and turgor, no rashes, no suspicious skin lesions noted.  Neuro: AOx2 (disoriented to time and full situation) forgetful, normal speech    Data:   I have reviewed all of the labs from today. Pertinent labs include:   Lab Results   Component Value Date    WBC 6.61 05/12/2021    HGB 11.4 (L) 05/12/2021    HCT 34.4 (L) 05/12/2021    MCV 97.2 05/12/2021    PLT 282 05/12/2021     Lab Results   Component Value Date    CREAT 0.60 05/12/2021    BUN 11 05/12/2021    NA 136 05/12/2021    K 3.8 05/12/2021    CL 102 05/12/2021    CO2 25 05/12/2021       Problem-Based Assesment and Plan:   Erika Kline is a 87 y.o. female with recently diagnosed esophageal SCC, stomach lymphoma in remission (2005), PAF?on apixaban, chronic diastolic dysfunction, nonrheumatic MR sp mitraclip, HTN/HLD, recent COVID19 infection who presents with failure to thrive.   ?  # Failure to thrive: POA Likely related to recently diagnosed esophageal SCC with new G tube dependence after extended hospital stay, now with ongoing weight loss and largely bed bound.   - NPO  - F/U thiamine and zinc   - consult  to nutrition, appreciate recs   - Continue tube feeds per RD rec Fibersource HN continuous, 30ml this am  - Free water flush   - PT/OT eval and treat   ?  # COVID19 infection: POA Per daughter, patient tested positive on 12/03 during SNF outbreak. Initially with symptoms like severe diarrhea, confusion, now improving. No respiratory symptoms currently or hypoxia noted. Symptomatically seems well controlled. COVID19 positive on admission 12/9.   - Supportive management   - Given symptoms improving, will hold off on anti-COVID therapies for now per unofficial ID consult  ?  # Esophageal SCC with G tube dependence: Recently diagnosed during hospitalization 11/4. Has not started any cancer directed therapies yet. Has staging imaging (CT C/A/P w contrast) from 11/07 at OSH. Established care with Dr. Pollyann Kennedy on 12/5.  - CT chest (12/11): slightly enlarged mid esophageal posterior eccentric mass with circumferential esophageal wall invasion, currently in total 19 x 19 mm (3-47)-previously 19 x 14 mm (4-30)  - CTAP (12/11): no evidence of metastatic disease   - (12/10): CEA=4.7 & CA19-9=28  - Updated Dr Pollyann Kennedy  - Pending pathology slides from [facility]; letterhead faxed    # Cancer related pain: POA Due to above              - Continue fentanyl 12 mcg patch Q72H, due for change today              - Continue oxycodone 5 mg Q6H PRN severe pain              - Continue tylenol 650 mg Q6H PRN pain  - Continue glycopyrrolate 1 mg at bedtime for secretions  - Continue pantoprazole 40 mg BID    ?  # Normocytic anemia: nPOA suspect due to dilutional. hgb on admission 11.9, patient received 1L fluid bolus in ED.   - Hgb stable  - Transfuse for hgb <8  - Monitor for bleeding, pt has had scan hemoptysis prior to admission   - CBC daily    # Paroxysmal A fib: POA On frailty dosing apixaban outpatient. CHADSVASC 6 with hx of TIA.   - Continue apixaban 2.5mg  PO BID   - Monitor closely for bleeding given scant hemoptysis reported by family   - Continue home metoprolol tartrate 12.5 mg BID, hold SBP < 100 or HR < 50  - Can resume if hgb continues to be stable   ?  # HTN: POA home metoprolol as above   ?  # Cognitive decline: POA family reports patient becoming more forgetful since her diagnosis c/f onset of dementia and requesting inpatient work up. Patient is currently a/o x 4 and is able to answer questions regarding her recent hospitalization but forgets the details. Query component of delirium from prolonged hospitalization.   - Delirium precautions   - Will need outpatient neurology referral on discharge for appropriate workup   - CTM    Inpatient Checklist  Orders Placed This Encounter      Diet NPO Reason for NPO: Other (specify), Aspiration risk; Please specify: tube feed dependent      Diet tube feeding non bolus Gastrostomy (GT or G-tube); Fibersource HN at 34ml/hr, advance by 10 ml/hr q8h to goal 60 ml/hr x 24 hours    DVT Prophylaxis:  sequential compression devices (SCDs) apixiban  GI Prophylaxis:  indicated due to patient is on chronic acid suppression therapy as an outpatient: Proton pump inhibitor  Central Lines:  none  Tubes/Drains:  percutaneous  gastric tube      Disposition  Continue inpatient management per above, Discharge plan pending clinical course and PT recs.     Code Status  DNR (No CPR, defibrillation, intubation),  Primary Emergency Contact: Joss,Casey, Home Phone: [phone]    Patient was seen, examined, and plan was formulated/discussed with attending Dr.Garon, [name]., MD     Author:    Nira Retort. Maisie Fus, NP   05/12/2021 12:12 PM

## 2021-05-12 NOTE — Other
Patient's Clinical Goal:   Clinical Goal(s) for the Shift: vss, PT, safety  Identify possible barriers to advancing the care plan:   Stability of the patient: Moderately Stable - low risk of patient condition declining or worsening   Progression of Patient's Clinical Goal:   Pt a/ox4 G tube feedings infusing at 40 cc.   1055- pt c/o nausea.  Gave zofran 4 mg iv  1100- Pt ambulated with PT 35 feet with FWW.    1301- pt c/o gas pain.  Gve famotidine iv   1318- Pt c/o G tube surgical site pain and generalized pain 9/10.  Gve oxycodone 5 mg G tube  1400--Accu check done bs 101  1420 refresh eye drops where given            Lidocaine patches where applied to back   1730- NO BM this shift   BP 110/70  ~ Pulse 70  ~ Temp 36.8 ?C (98.2 ?F) (Oral)  ~ Resp 18  ~ Ht 1.651 m (5' 5'')  ~ Wt 45.6 kg (100 lb 8 oz)  ~ SpO2 95%  ~ BMI 16.72 kg/m?  room air

## 2021-05-12 NOTE — Progress Notes
05/12/21 1342   Time Calculation   Start Time 1341   Doc Time (min) 15 min   Patient not seen due to Patient asleep   Chart accessed for Treatment scheduling or assignment;Assistance with patient care/discharge planning     OT order received, chart reviewed, RN cleared for OT eval.  Upon arrival pt was sleeping peacefully.  OT attempted to rouse with verbal cues; pt did not stir.  OT will let pt rest at this time and follow up as schedule permits.

## 2021-05-13 LAB — Magnesium: MAGNESIUM: 1.7 meq/L (ref 1.4–1.9)

## 2021-05-13 LAB — Phosphorus: PHOSPHORUS: 3.2 mg/dL (ref 2.3–4.4)

## 2021-05-13 LAB — Glucose,POC
GLUCOSE,POC: 90 mg/dL (ref 65–99)
GLUCOSE,POC: 108 mg/dL — ABNORMAL HIGH (ref 65–99)

## 2021-05-13 LAB — Differential Automated: NEUTROPHIL PERCENT, AUTO: 62 (ref 1.30–3.40)

## 2021-05-13 LAB — Comprehensive Metabolic Panel
ALANINE AMINOTRANSFERASE: 17 U/L (ref 8–70)
ALBUMIN: 3.1 g/dL — ABNORMAL LOW (ref 3.9–5.0)

## 2021-05-13 LAB — CBC: RED BLOOD CELL COUNT: 3.57 x10E6/uL — ABNORMAL LOW (ref 3.96–5.09)

## 2021-05-13 MED ADMIN — ONDANSETRON HCL 4 MG/2ML IJ SOLN: 8 mg | INTRAVENOUS | @ 13:00:00 | Stop: 2021-06-10 | NDC 60505613000

## 2021-05-13 MED ADMIN — CARBOXYMETHYLCELLULOSE SOD PF 0.5 % OP SOLN: 1 [drp] | OPHTHALMIC | @ 21:00:00 | Stop: 2021-05-13 | NDC 00023040330

## 2021-05-13 MED ADMIN — MELATONIN 3 MG PO TABS: 3 mg | ORAL | @ 07:00:00 | Stop: 2021-06-10 | NDC 50268052411

## 2021-05-13 MED ADMIN — OXYCODONE HCL 5 MG/5ML PO SOLN: 5 mg | GASTROSTOMY | @ 13:00:00 | Stop: 2021-05-17 | NDC 00121482705

## 2021-05-13 MED ADMIN — MAGNESIUM SULFATE 2 GM/50ML IV SOLN: 2 g | INTRAVENOUS | @ 22:00:00 | Stop: 2021-05-13 | NDC 25021061281

## 2021-05-13 MED ADMIN — METOPROLOL TARTRATE 12.5 MG PO TABS: 12.5 mg | GASTROSTOMY | @ 05:00:00 | Stop: 2021-05-17 | NDC 57237010001

## 2021-05-13 MED ADMIN — GLYCERIN 35 % MT LIQD: 30 mL | ORAL | @ 18:00:00 | Stop: 2021-06-12

## 2021-05-13 MED ADMIN — CARBOXYMETHYLCELLULOSE SOD PF 0.5 % OP SOLN: 1 [drp] | OPHTHALMIC | @ 05:00:00 | Stop: 2021-05-13 | NDC 00023040330

## 2021-05-13 MED ADMIN — ACETAMINOPHEN 325 MG PO TABS: 650 mg | ORAL | @ 18:00:00 | Stop: 2021-05-14 | NDC 00904677361

## 2021-05-13 MED ADMIN — GLYCOPYRROLATE 1 MG PO TABS: 1 mg | GASTROSTOMY | @ 05:00:00 | Stop: 2021-05-17 | NDC 00904670961

## 2021-05-13 MED ADMIN — CARBOXYMETHYLCELLULOSE SOD PF 0.5 % OP SOLN: 1 [drp] | OPHTHALMIC | @ 12:00:00 | Stop: 2021-05-13 | NDC 00023040330

## 2021-05-13 MED ADMIN — SIMETHICONE 80 MG PO CHEW: 80 mg | GASTROSTOMY | @ 07:00:00 | Stop: 2021-05-17 | NDC 24385011878

## 2021-05-13 MED ADMIN — APIXABAN 2.5 MG PO TABS: 2.5 mg | ORAL | @ 05:00:00 | Stop: 2021-05-17 | NDC 00003089331

## 2021-05-13 MED ADMIN — APIXABAN 2.5 MG PO TABS: 2.5 mg | ORAL | @ 18:00:00 | Stop: 2021-05-17 | NDC 00003089331

## 2021-05-13 MED ADMIN — LIDOCAINE 5 % EX PTCH: 2 | TRANSDERMAL | @ 21:00:00 | Stop: 2021-05-17 | NDC 63481068706

## 2021-05-13 MED ADMIN — OXYCODONE HCL 5 MG/5ML PO SOLN: 5 mg | GASTROSTOMY | @ 21:00:00 | Stop: 2021-05-18 | NDC 00121482705

## 2021-05-13 MED ADMIN — METOPROLOL TARTRATE 12.5 MG PO TABS: 12.5 mg | GASTROSTOMY | @ 18:00:00 | Stop: 2021-05-17 | NDC 57237010001

## 2021-05-13 MED ADMIN — GLYCERIN 35 % MT LIQD: 30 mL | ORAL | @ 22:00:00 | Stop: 2021-05-17

## 2021-05-13 MED ADMIN — LIDOCAINE 5 % EX PTCH: 2 | TRANSDERMAL | @ 08:00:00 | Stop: 2021-06-11

## 2021-05-13 MED ADMIN — PANTOPRAZOLE 40 MG/20 ML ORAL SUSPENSION: 40 mg | GASTROSTOMY | @ 18:00:00 | Stop: 2021-06-10 | NDC 00093001220

## 2021-05-13 MED ADMIN — PANTOPRAZOLE 40 MG/20 ML ORAL SUSPENSION: 40 mg | GASTROSTOMY | @ 05:00:00 | Stop: 2021-05-17 | NDC 00093001220

## 2021-05-13 NOTE — Progress Notes
SOLID ONCOLOGY SERVICE PROGRESS NOTE     PATIENT:  Erika Kline   DATE OF SERVICE:  05/13/2021  HOSPITAL DAY:  3  CHIEF COMPLAINT:  Failure To Thrive (Pt w/ hx of esophageal cancer, sent by Dr Pollyann Kennedy for eval and admit to solid onc. +Covid)  ADMISSION CATEGORY:  Symptom Control    Last 24 Hour/Overnight Events:   - TF reached goal this am at 90ml/hr  - No recurrent diarrhea  - 6 beats NSVT/PVC, pt asymptomatic   - No acute events overnight     Subjective/Review of Systems:   - Ongoing back pain, mainly due to bed, improves with oxycodone  - Discussion with daughter for plans for [facility], as previously concerns with SNF  - Intermittent gas pain  - Denies CP, SOB, vomiting, constipation, or abd pain     Medications:   Scheduled:  apixaban, 2.5 mg, Oral, BID  carboxymethylcellulose, 1 drop, Both Eyes, QID  fentaNYL, 1 patch, Transdermal, Q72H  glycopyrrolate, 1 mg, Per G Tube, QHS  lidocaine, 2 patch, Transdermal, Q24H  metoprolol tartrate, 12.5 mg, Per G Tube, BID  pantoprazole, 40 mg, Per G Tube, BID  PRN:  acetaminophen **OR** acetaminophen suppository, melatonin oral/enteral/sublingual, ondansetron **OR** ondansetron injection/IVPB, oxyCODONE, simethicone  Infusions:    Physical Exam:   Temp:  [36.5 ?C (97.7 ?F)-37 ?C (98.6 ?F)] 37 ?C (98.6 ?F)  Heart Rate:  [66-78] 66  Resp:  [16-18] 18  BP: (103-113)/(57-78) 103/57  NBP Mean:  [71-88] 71  SpO2:  [93 %-95 %] 93 %  I/O:  I/O last 2 completed shifts:  In: 1080 [Other:30; NG/GT:1050]  Out: -   General: alert, chronically ill appearing, NAD  Head: Atraumatic, normocephalic, PERRL, EOMI, mmm, pharynx normal without lesions.  Neck: supple, no significant adenopathy.  Heart: RRR normal S1, S2,  Lungs: normal work of breathing,clear to auscultation with no wheezes, rales or rhonchi  Abdomen: soft, ttp around the g-tube, nondistended, normal bowel sounds. g-tube with mild surrounding erythema but no drainage  Extremities: warm and well perfused; no lower extremity edema   Skin: normal coloration and turgor, no rashes, no suspicious skin lesions noted.  Neuro: AOx2 (disoriented to time and full situation) forgetful, normal speech    Data:   I have reviewed all of the labs from today. Pertinent labs include:   Lab Results   Component Value Date    WBC 6.61 05/12/2021    HGB 11.4 (L) 05/12/2021    HCT 34.4 (L) 05/12/2021    MCV 97.2 05/12/2021    PLT 282 05/12/2021     Lab Results   Component Value Date    CREAT 0.60 05/12/2021    BUN 11 05/12/2021    NA 136 05/12/2021    K 3.8 05/12/2021    CL 102 05/12/2021    CO2 25 05/12/2021     Micro:  Recent Results (from the past 168 hour(s))   Expedited COVID-19 and Influenza A B PCR, Respiratory Upper    Collection Time: 05/10/21  7:55 PM    Specimen: Nasopharyngeal; Respiratory, Upper   Result Value Ref Range    Specimen Type Respiratory, Upper     COVID-19 PCR/TMA Detected (A) Not Detected    Influenza A PCR Not Detected Not Detected    Influenza B PCR Not Detected Not Detected   MRSA Surveillance, Nares    Collection Time: 05/11/21 12:07 AM    Specimen: Nares; Respiratory, Upper   Result Value Ref Range    MRSA  Surveillance       No Methicillin-resistant Staphylococcus aureus isolated.     Imaging:  No imaging has been resulted in the last 24 hours    Problem-Based Assesment and Plan:   Erika Kline is a 87 y.o. female with recently diagnosed esophageal SCC, stomach lymphoma in remission (2005), PAF on apixaban, chronic diastolic dysfunction, nonrheumatic MR sp mitraclip, HTN/HLD, recent COVID19 infection who presents with failure to thrive.      # Failure to thrive: POA Likely related to recently diagnosed esophageal SCC with new G tube dependence after extended hospital stay, now with ongoing weight loss and largely bed bound.   - NPO  - F/U thiamine and zinc   - consult to nutrition, appreciate recs   - Continue tube feeds per RD rec Fibersource HN continuous, 60ml this am  - Free water flush   - PT/OT eval and treat   - HH nutrition ordered     # COVID19 infection: POA Per daughter, patient tested positive on 12/03 during SNF outbreak. Initially with symptoms like severe diarrhea, confusion, now improving. No respiratory symptoms currently or hypoxia noted. Symptomatically seems well controlled. COVID19 positive on admission 12/9.   - Supportive management   - Given symptoms improving, will hold off on anti-COVID therapies for now per unofficial ID consult     # Esophageal SCC with G tube dependence: Recently diagnosed during hospitalization 11/4. Has not started any cancer directed therapies yet. Has staging imaging (CT C/A/P w contrast) from 11/07 at OSH. Established care with Dr. Pollyann Kennedy on 12/5.  - CT chest (12/11): slightly enlarged mid esophageal posterior eccentric mass with circumferential esophageal wall invasion, currently in total 19 x 19 mm (3-47)-previously 19 x 14 mm (4-30)  - CTAP (12/11): no evidence of metastatic disease   - (12/10): CEA=4.7 & CA19-9=28  - Pending pathology slides from [facility]; letterhead faxed  - Will consult radonc per Dr Pollyann Kennedy, defer chemotherapy given poor performance status  - Will need follow-up with Dr. Pollyann Kennedy     # Cancer related pain: POA Due to above              - Continue fentanyl 12 mcg patch Q72H, due for change today              - Continue oxycodone 5 mg Q6H PRN severe pain              - Continue tylenol 650 mg Q6H PRN pain  - Continue glycopyrrolate 1 mg at bedtime for secretions  - Continue pantoprazole 40 mg BID       # Normocytic anemia: nPOA suspect due to dilutional. hgb on admission 11.9, patient received 1L fluid bolus in ED.   - Hgb stable  - Transfuse for hgb <8  - Monitor for bleeding, pt has had scan hemoptysis prior to admission   - CBC daily    # Paroxysmal A fib: POA On frailty dosing apixaban outpatient. CHADSVASC 6 with hx of TIA.   - Continue apixaban 2.5mg  PO BID   - Monitor closely for bleeding given scant hemoptysis reported by family   - Continue home metoprolol tartrate 12.5 mg BID, hold SBP < 100 or HR < 50  - Can resume if hgb continues to be stable      # HTN: POA home metoprolol as above     # NSVT beats /PVCs, nPOA: Pacemaker. Asymptomatic, less than 10 beats  - replete electrolytes K/Mg >4/2  - 6  beats NSVT/PVC     # Cognitive decline: POA family reports patient becoming more forgetful since her diagnosis c/f onset of dementia and requesting inpatient work up. Patient is currently a/o x 4 and is able to answer questions regarding her recent hospitalization but forgets the details. Query component of delirium from prolonged hospitalization.   - Delirium precautions   - Will need outpatient neurology referral on discharge for appropriate workup   - CTM    Inpatient Checklist  Orders Placed This Encounter      Diet NPO Reason for NPO: Other (specify), Aspiration risk; Please specify: tube feed dependent      Diet tube feeding non bolus Gastrostomy (GT or G-tube); Fibersource HN at 75ml/hr, advance by 10 ml/hr q8h to goal 60 ml/hr x 24 hours    DVT Prophylaxis:  sequential compression devices (SCDs) apixiban  GI Prophylaxis:  indicated due to patient is on chronic acid suppression therapy as an outpatient: Proton pump inhibitor  Central Lines:  none  Tubes/Drains:  percutaneous gastric tube      Disposition  Continue inpatient management per above, discharge plan to [facility]    Code Status  DNR (No CPR, defibrillation, intubation),  Primary Emergency ContactEdona, Schreffler, Home Phone: [phone]    Patient was seen, examined, and plan was formulated/discussed with attending Dr.Garon, [name]., MD     Author:    Nira Retort. Maisie Fus, NP   05/13/2021 7:24 AM

## 2021-05-13 NOTE — Other
Patient's Clinical Goal:   Clinical Goal(s) for the Shift: VSS. Comfort/Safety.  Identify possible barriers to advancing the care plan:   Stability of the patient: Moderately Stable - low risk of patient condition declining or worsening   Progression of Patient's Clinical Goal:   Patient A/OX4. Forgetful at times. Back pain managed with prn med. VSS. NSR with occ PVCs on monitor. 12 beats vtach non-sustained- Asymptomatic, MD notified. TF now at goal 60cc. Residual max:10 cc. Nauseated x 1- relieved with 1x prn zofran. Voiding freely. No BM overnight. Will endorse plan of care to AM RN.

## 2021-05-13 NOTE — Progress Notes
BRIEF RADIATION ONCOLOGY PROGRESS NOTE    Date: 05/13/2021    We were consulted about Ms. Erika Kline. She may be a candidate for palliative radiation therapy (10 Fx) vs definitive radiation therapy (~25-30 Fx) depending on her goals of care. This would be handled as an outpatient with a CT simulation session and daily radiation treatments. If she is going to stay local, please refer to [place] (write Dr. Cristina Gong in the comments). If she is going to return to the [facility], please refer to a local radiation oncology office there.    Discussed with Dr. Larna Daughters.    Jenne Pane, MD  Radiation Oncology Resident, PGY-3  [facility]

## 2021-05-13 NOTE — Consults
CASE MANAGER ASSESSMENT      Admit ZOXW:[id number]    Date of Initial CM Assessment: 05/13/2021    Problems: Principal Problem:    Failure to thrive in adult POA: Yes  Active Problems:    Weakness generalized POA: Yes       Past Medical History:   Diagnosis Date   ? Allergy, unspecified not elsewhere classified    ? Arthritis    ? Basal cell carcinoma    ? Cancer (HCC/RAF)     Stomach Cancer S/P chemo   ? Depression    ? Gastric lymphoma (HCC/RAF)    ? Gastric lymphoma (HCC/RAF)    ? Gastric lymphoma (HCC/RAF)    ? Hyperlipidemia    ? Hypertension     Past Surgical History:   Procedure Laterality Date   ? breast implants  1978   ? BREAST SURGERY            Primary Care Physician:Rosen, Marigene Ehlers., MD  [phone]      NEEDS ASSESSMENT:          Primary Living Situation: Lives w/Family         Pre-admission Living Situation: Home/Apartment                  Primary Support Systems: Children    Contact Name: Layal Javid and Lauryl Duplechan (daughters) Phone Number: [phone] and [phone]   Does the patient have a Family/Support System member participating in Discharge Planning?: Yes            Bathroom on Main Floor: Yes  Stairs in Home: 0       Prior Treatments / Services: None       Who is your PCP?: Dr. Jerelyn Charles              Do you need information/education regarding your medical condition?: No       Verbalized financial concerns?: No       Were you hospitalized in the last 30 days?: Yes        READMIT ASSESSMENT: (IF APPLICABLE)     Is this a planned readmission?: Yes  Type of readmission: Other (Comment) (cancer eval by PCP)      DISCHARGE ASSESSMENT:     Projected Date of Discharge: 05/17/2021    Anticipated Complex D/C?: Yes    Projected Discharge to: Home         Projected Discharge Needs: Hospice      Support Identified at Discharge: Child  Name of Discharge Support Person: Leroy Trim and Lauryl Duplechan (daughters)       Who is available to transport you upon discharge?: Family Transportation, Tana Felts         CM had conference call with patient's daughters [name] and Lauryl who provided detailed account of recent medical and dispo history. The pt recently had a very poor experience at a SNF if [facility], so the pt's family would like to avoid SNF placement if possible. Pt had no g-tube prior to recent admission at [facility] so no [facility] services have been assigned. Pt's family agreeable to palliative/hospice pending medical determinations made by the medical team. Pt's family demonstrated understanding of how hospice/palliative referrals are made and freedom of choice process was also explained.       Cassell Clement,  05/13/2021

## 2021-05-13 NOTE — Telephone Encounter
This was handled last week

## 2021-05-13 NOTE — Progress Notes
Physical Therapy Treatment      PATIENT: Erika Kline  MRN: [id number]    Treatment Date: 05/13/2021    Patient Presentation: Position: In bed;Bed alarm on  Lines/devices Drains: HLIV;G/J/Peg tube;Cardiac Monitor;Pulse Ox    Precautions   Precautions: Fall risk;Monitor Vitals;Check Labs;Isolation (enhanced)  Orthotic: None  Current Activity Order: Order implies OOB  Weight Bearing Status: Not Applicable    Cognition   Cognition: At Baseline Cognitive Status  Safety Awareness: Good awareness of safety precautions  Barriers to Learning: None    Bed Mobility   Supine to Sit: Stand by Assist    Functional Mobility   Sit to Stand: Contact Guard Assist;Assistive Device (Comment);Verbal Cueing (FWW)  Transfer(s) Performed: 1  Transfer #1: Bed;To;Chair  Transfer #1 Level of Assist: Contact Guard Assist;Verbal Cueing  Transfer #1 Type: Stand step;to Left  Transfer #1 Asst Device: Front wheeled walker  Ambulation: Soil scientist (Feet): Marched in place 15-20 reps x3 sets, limited by fatigue and initial dizziness preventing ambulation without chair follow  Gait Pattern: TEFL teacher Device: Automotive engineer: Not Applicable  Stairs: Not Applicable     Balance   Sitting - Dynamic: Good;with UE support  Standing - Dynamic: Fair;with UE support    Neurological (if indicated)   Neuro Deficits: No    Exercises   Heel Raises: Bilateral;20 Reps;2 Sets;Seated  Standing Hip Flexion: Active;Bilateral;15 Reps;2 Sets  Other Exercise(s): STS x5     Pain Assessment   Patient complains of pain: No    Patient Status   Activity Tolerance: Poor  Oxygen Needs: Room Air  Response to Treatment: Fatigued;with activity;Resolved with rest;Nursing notified;Vital signs stable  Compliance with Precautions: Not Applicable  Call light in reach: Yes  Presentation post treatment: Up in chair;Lines/drains intact;Pulse Ox;Chair alarm on  Comments: Session limited by fatigue and initial orthostatic BP, recovered to baseline BP from pre-session by end of session.    Interdisciplinary Communication   Interdisciplinary Communication: Nurse;Occupational Therapist    Treatment Plan   Continue PT Treatment Plan with Focus on: Gait training;Balance training    PT Recommendations   Discharge Recommendation: Physical Therapy;Would benefit from continued therapy  Discharge concerns: Requires assistance for mobility;Requires assistance for self care  Discharge Equipment Recommended: Defer to discharge facility    Treatment Completed by: Baron Sane, PT

## 2021-05-14 DIAGNOSIS — Z931 Gastrostomy status: Secondary | ICD-10-CM

## 2021-05-14 DIAGNOSIS — C8599 Non-Hodgkin lymphoma, unspecified, extranodal and solid organ sites: Secondary | ICD-10-CM

## 2021-05-14 DIAGNOSIS — R627 Adult failure to thrive: Secondary | ICD-10-CM

## 2021-05-14 DIAGNOSIS — C159 Malignant neoplasm of esophagus, unspecified: Secondary | ICD-10-CM

## 2021-05-14 DIAGNOSIS — I10 Essential (primary) hypertension: Secondary | ICD-10-CM

## 2021-05-14 DIAGNOSIS — R634 Abnormal weight loss: Secondary | ICD-10-CM

## 2021-05-14 LAB — Candida auris Surveillance PCR: CANDIDA AURIS DNA PCR: NOT DETECTED

## 2021-05-14 LAB — Glucose,POC
GLUCOSE,POC: 112 mg/dL — ABNORMAL HIGH (ref 65–99)
GLUCOSE,POC: 108 mg/dL — ABNORMAL HIGH (ref 65–99)
GLUCOSE,POC: 102 mg/dL — ABNORMAL HIGH (ref 65–99)
GLUCOSE,POC: 112 mg/dL — ABNORMAL HIGH (ref 65–99)

## 2021-05-14 LAB — CBC: NUCLEATED RBC%, AUTOMATED: 0 (ref 31.5–35.5)

## 2021-05-14 LAB — Comprehensive Metabolic Panel
ALANINE AMINOTRANSFERASE: 13 U/L (ref 8–70)
ALBUMIN: 2.7 g/dL — ABNORMAL LOW (ref 3.9–5.0)

## 2021-05-14 LAB — Differential Automated: EOSINOPHIL PERCENT, AUTO: 2 (ref 0.00–0.50)

## 2021-05-14 LAB — Phosphorus: PHOSPHORUS: 3.7 mg/dL (ref 2.3–4.4)

## 2021-05-14 LAB — Magnesium: MAGNESIUM: 1.7 meq/L (ref 1.4–1.9)

## 2021-05-14 LAB — Zinc, Plasma: ZINC, SERUM OR PLASMA: 52.9 ug/dL — ABNORMAL LOW (ref 60.0–120.0)

## 2021-05-14 MED ADMIN — FAMOTIDINE (PF) 20 MG/2ML IV SOLN: 20 mg | INTRAVENOUS | @ 05:00:00 | Stop: 2021-05-14 | NDC 67457043300

## 2021-05-14 MED ADMIN — LIDOCAINE 5 % EX PTCH: 2 | TRANSDERMAL | @ 22:00:00 | Stop: 2021-05-17 | NDC 00591267911

## 2021-05-14 MED ADMIN — MELATONIN 3 MG PO TABS: 3 mg | ORAL | @ 08:00:00 | Stop: 2021-05-17 | NDC 50268052411

## 2021-05-14 MED ADMIN — ZINC SULFATE HEPTAHYDRATE (4.07 MG/ML ELEMENTAL ZINC) ORAL SOLUTION: 50 mg | GASTROSTOMY | @ 21:00:00 | Stop: 2021-05-17 | NDC 00904533260

## 2021-05-14 MED ADMIN — METOPROLOL TARTRATE 12.5 MG PO TABS: 12.5 mg | GASTROSTOMY | @ 18:00:00 | Stop: 2021-05-17 | NDC 57237010001

## 2021-05-14 MED ADMIN — MAGNESIUM SULFATE 4 GM/100ML IV SOLN: 4 g | INTRAVENOUS | @ 18:00:00 | Stop: 2021-05-14 | NDC 00409672923

## 2021-05-14 MED ADMIN — APIXABAN 2.5 MG PO TABS: 2.5 mg | ORAL | @ 05:00:00 | Stop: 2021-05-17 | NDC 00003089331

## 2021-05-14 MED ADMIN — POLYVINYL ALCOHOL-POVIDONE PF 1.4-0.6 % OP SOLN: 2 [drp] | OPHTHALMIC | @ 14:00:00 | Stop: 2021-05-14 | NDC 00023050601

## 2021-05-14 MED ADMIN — GLYCERIN 35 % MT LIQD: 30 mL | ORAL | @ 02:00:00 | Stop: 2021-06-12

## 2021-05-14 MED ADMIN — GLYCERIN 35 % MT LIQD: 30 mL | ORAL | @ 09:00:00 | Stop: 2021-05-17

## 2021-05-14 MED ADMIN — METOPROLOL TARTRATE 12.5 MG PO TABS: 12.5 mg | GASTROSTOMY | @ 08:00:00 | Stop: 2021-05-17 | NDC 57237010001

## 2021-05-14 MED ADMIN — GLYCERIN 35 % MT LIQD: 30 mL | ORAL | @ 21:00:00 | Stop: 2021-05-17

## 2021-05-14 MED ADMIN — OXYCODONE HCL 5 MG/5ML PO SOLN: 5 mg | GASTROSTOMY | @ 04:00:00 | Stop: 2021-05-17 | NDC 00121482705

## 2021-05-14 MED ADMIN — GLYCERIN 35 % MT LIQD: 30 mL | ORAL | @ 14:00:00 | Stop: 2021-05-17

## 2021-05-14 MED ADMIN — APIXABAN 2.5 MG PO TABS: 2.5 mg | ORAL | @ 18:00:00 | Stop: 2021-05-17 | NDC 00003089331

## 2021-05-14 MED ADMIN — OXYCODONE HCL 5 MG/5ML PO SOLN: 5 mg | GASTROSTOMY | @ 19:00:00 | Stop: 2021-05-17 | NDC 00121482705

## 2021-05-14 MED ADMIN — GLYCOPYRROLATE 1 MG PO TABS: 1 mg | GASTROSTOMY | @ 05:00:00 | Stop: 2021-05-17 | NDC 00904670961

## 2021-05-14 MED ADMIN — POLYVINYL ALCOHOL-POVIDONE PF 1.4-0.6 % OP SOLN: 2 [drp] | OPHTHALMIC | @ 02:00:00 | Stop: 2021-05-14 | NDC 00023050601

## 2021-05-14 MED ADMIN — PANTOPRAZOLE 40 MG/20 ML ORAL SUSPENSION: 40 mg | GASTROSTOMY | @ 05:00:00 | Stop: 2021-05-17 | NDC 00093001220

## 2021-05-14 MED ADMIN — FENTANYL 12 MCG/HR TD PT72: 1 | TRANSDERMAL | @ 19:00:00 | Stop: 2021-05-20 | NDC 00406911276

## 2021-05-14 MED ADMIN — ARTIFICIAL TEARS 83-15 % OP OINT: OPHTHALMIC | @ 22:00:00 | Stop: 2021-05-17

## 2021-05-14 MED ADMIN — LIDOCAINE 5 % EX PTCH: 2 | TRANSDERMAL | @ 08:00:00 | Stop: 2021-05-17

## 2021-05-14 MED ADMIN — SIMETHICONE 80 MG PO CHEW: 80 mg | GASTROSTOMY | @ 20:00:00 | Stop: 2021-05-17 | NDC 24385011878

## 2021-05-14 MED ADMIN — FAMOTIDINE 20 MG PO TABS: 20 mg | ORAL | @ 18:00:00 | Stop: 2021-05-17 | NDC 60687059511

## 2021-05-14 MED ADMIN — PANTOPRAZOLE 40 MG/20 ML ORAL SUSPENSION: 40 mg | GASTROSTOMY | @ 18:00:00 | Stop: 2021-05-17 | NDC 00093001220

## 2021-05-14 MED ADMIN — ARTIFICIAL TEARS 83-15 % OP OINT: OPHTHALMIC | @ 21:00:00 | Stop: 2021-06-13

## 2021-05-14 MED ADMIN — POLYVINYL ALCOHOL-POVIDONE PF 1.4-0.6 % OP SOLN: 2 [drp] | OPHTHALMIC | @ 18:00:00 | Stop: 2021-05-14 | NDC 00023050601

## 2021-05-14 MED ADMIN — ONDANSETRON HCL 4 MG/2ML IJ SOLN: 8 mg | INTRAVENOUS | @ 05:00:00 | Stop: 2021-06-10 | NDC 60505613000

## 2021-05-14 MED ADMIN — POLYVINYL ALCOHOL-POVIDONE PF 1.4-0.6 % OP SOLN: 2 [drp] | OPHTHALMIC | @ 05:00:00 | Stop: 2021-05-14 | NDC 00023050601

## 2021-05-14 MED ADMIN — POLYVINYL ALCOHOL-POVIDONE PF 1.4-0.6 % OP SOLN: 1 [drp] | OPHTHALMIC | @ 01:00:00 | Stop: 2021-05-14 | NDC 00023050601

## 2021-05-14 MED ADMIN — SIMETHICONE 80 MG PO CHEW: 80 mg | GASTROSTOMY | @ 05:00:00 | Stop: 2021-05-17 | NDC 24385011878

## 2021-05-14 MED ADMIN — FENTANYL 12 MCG/HR TD PT72: 1 | TRANSDERMAL | @ 18:00:00 | Stop: 2021-05-22

## 2021-05-14 NOTE — Other
Patient's Clinical Goal:   Clinical Goal(s) for the Shift: safety, rest and nutrition.  Identify possible barriers to advancing the care plan: None  Stability of the patient: Moderately Unstable - medium risk of patient condition declining or worsening    Progression of Patient's Clinical Goal:   Pt A/O x4, VSS  Afebrile, denies respiratory distress, oxycodone given for pain management. Pt complained heartburn/gas, famotidine iv push given as ordered. Gtube  Feeding infusing @ 66ml/hr which is the goal. Call light within pt reach, bed in lower position and all orders/safety measures carried out.

## 2021-05-14 NOTE — Consults
Occupational Therapy Evaluation      PATIENT: Erika Kline  MRN: [id number]  DOB: 1933-11-19    ADMIT DATE: 05/10/2021       Date of Evaluation: 05/13/2021    Problems: Principal Problem:    Failure to thrive in adult POA: Yes  Active Problems:    Weakness generalized POA: Yes       Past Medical History:   Diagnosis Date   ? Allergy, unspecified not elsewhere classified    ? Arthritis    ? Basal cell carcinoma    ? Cancer (HCC/RAF)     Stomach Cancer S/P chemo   ? Depression    ? Gastric lymphoma (HCC/RAF)    ? Gastric lymphoma (HCC/RAF)    ? Gastric lymphoma (HCC/RAF)    ? Hyperlipidemia    ? Hypertension     Past Surgical History:   Procedure Laterality Date   ? breast implants  1978   ? BREAST SURGERY          Relevant Hospital Course: Patient?is a 87 y/o female with a PMH as detailed above, significantly recent diagnosis of esophageal SCC, stomach lymphoma?in remission?(2005), PAF?on?apixaban, chronic diastolic dysfunction, nonrheumatic MR sp mitraclip, HTN/HLD, recent COVID19 infection?who presents with failure to thrive.?Of note, after her initial hospital admission (diagnosis, G tube placement, c/b by abscess formation) the patient went to?SNF, completed antibiotics on 11/30.?Patient found COVID+ on 12/03, symptoms included diarrhea, headache, diffuse weakness, with confusion.    Patient Stated Goal:  ''improve pain''     Living Arrangements   Type of Home: House (house in [place])  Home Layout: One level  Bathroom Shower/Tub: Medical sales representative: Standard  Home Equipment: None    Prior Level of Function   Level of Independence: Independent, Community ambulation (According to patient's daughter, she was very active prior to her esophageal CA diagnosis)  Lives With: Alone  Support Available: Family (Patient's daughters are willing to look for caregiver services if needed)  # of hours available: Patient's daughter lives next door and able to assist as needed.  ADL Assistance: Independent  Homemaking Assistance: Independent  Vocation: Retired  Vision: Within Systems developer  Hearing: Within Functional Limits    Precautions   Precautions: Dance movement psychotherapist;Check Labs;Isolation (enhanced)  Orthotic: None  Current Activity Order: Order implies OOB  Weight Bearing Status: Not Applicable    GENERAL EVALUATION   Position: Up in chair;Chair alarm on  Lines/devices Drains: HLIV;G/J/Peg tube;Cardiac Monitor;Pulse Ox    Bed Mobility   Supine Scooting: Not Performed  Rolling: Not Performed  Supine to Sit: Not Performed (pt received up in chair at time of initial eval)  Sit to Supine: Not Performed (pt left up in chair post eval)    Functional Transfers   Sit to Stand: Declined  Transfer: Not Performed  Functional Mobility: Declined    Activities of Daily Living (ADLs)   Eating: Not performed  Grooming: Not performed  Bathing: Not performed  UB Dressing: Not performed  LB Dressing: Performed  LB Dressing Assistance: Moderate Assist (due to back pain)  LB Dressing Deficit: Don/doff R sock;Verbal cueing;Supervision/safety  LB Dressing Adaptive Equipment: None  LB Dressing Where Assessed: Chair  Toileting: Not performed    Balance   Sitting - Static: Other (Comment) (pt deferred out of chair tasks; will continue to assess)  Sitting - Dynamic: Other (Comment) (pt deferred out of chair tasks; will continue to assess)     RUE Assessment   RUE Assessment: Within Functional Limits  LUE Assessment   LUE Assessment: Within Functional Limits      Hand Function   Gross Grasp: Functional  Coordination: Functional    Edema   Edema: BUE none noted      Sensation   Sensation: Grossly intact    Cognition   Cognition: Exceptions to WDL or Baseline Status  Arousal/Alertness: Appropriate responses to stimuli  Attention Span: Attends with cues to redirect  Memory: Decreased recall of recent events  Orientation Level: Oriented to person;Oriented to place;Oriented to situation  Following Commands: Follows one step commands with increased time;Follows one step commands with repetition  Problem Solving: Assistance required to implement solutions  Sequencing: Intact  Initiation: Fair  Safety Awareness: Fair awareness of safety precautions  Barriers to Learning: Desire/Motivation;Readiness to Learn    Pain Assessment   Patient complains of pain: Yes  Pain Quality: Aching;Sore  Pain Scale Used: Numeric Pain Scale  Pain Intensity: Patient unable to rate  Pain Location: Back  Action Taken: Nursing notified;Pain mgmt education;Positioning    Patient Status   Activity Tolerance: Not tested  Oxygen Needs: Room Air  Response to Treatment: Pain;Fatigued;with activity;Resolved with rest;Nursing notified  Compliance with Precautions: Not Applicable  Call light in reach: Yes  Presentation post treatment: Up in chair;Lines/drains intact;Pulse Ox  Comments: Chart reviewed, RN notified and cleared pt to be seen for initial OT evaluation. Pt received up in chair, agreeable to participate in initial OT evaluation. Pt limited by back pain with limited LB ADL, decreased functional endurance/activity tolerance, decreased motivation to participate in therapy. Pt deferred out of chair tasks due to back pain, RN notified and per RN to early in schedule to receive pain patch, pt aware and verbalized ''can't go back to bed without pain patch''. Pt required increased time/encouragement to participate in therapy, deferred most ADLs due to completing prior to eval.  Pt will benefit from skilled OT services to maximize independence in ADLs/functional transfers.    Interdisciplinary Communication   Interdisciplinary Communication: Nurse;Physical Therapist      ASSESSMENT   Rehab Potential: Good  Inpatient Recommendation: OT treatment  Problem List: Pain limiting ADLs;Decreased self care skills;Decreased functional balance;Impaired functional endurance  Treatment Plan: ADL training;Patient and/or family education;Therapeutic exercise;Energy conservation;Functional transfer training;Functional balance activities;Graded functional activities;Discharge planning  Frequency: 3-5 x/week  Duration (days): 30  Progress Note Due Date: 05/20/21      Goals Discussed With: Patient    Short Term Goals to be achieved in: 7 days  Pt will groom self: with stand by assist;standing  Pt will toilet self: with stand by assist  Pt will dress upper body: with stand by assist;sitting edge of bed;sitting in chair  Pt will dress lower body: with stand by assist;sitting edge of bed;sitting in chair;with verbal cues  Pt will perform: to/from toilet;to/from chair;with stand by assist (defer AD to PT if needed)    Long Term Goals to be achieved in: 30 days (pt will maximize independence in ADLs/functional transfers to supervision to set up)    OT Recommendations   Discharge Recommendation: Would benefit from continued therapy  Discharge concerns: Requires supervision for self care;Requires supervision for mobility  Discharge Equipment Recommended: Walker;Paediatric nurse;To be assessed further    Evaluation Completed by: Audria Nine, OT,  05/13/2021

## 2021-05-14 NOTE — Other
Patient's Clinical Goal:   Clinical Goal(s) for the Shift: Pt remains hemodynamically stable, pt comfort and safety    Identify possible barriers to advancing the care plan: Pt with significant complaints of pain/discomfort to L eye - pt continues to rub eyes despite education.     Stability of the patient: Moderately Unstable - medium risk of patient condition declining or worsening      Progression of Patient's Clinical Goal: Pt A&Ox2-3, with intermittent episodes of confusion but redirectable. Pt with complaints of pain to L eye and back this shift, VSS. Assessment completed and medications administered per MAR. Scheduled eye drops administered and PRN Oxy administered for pt's pain. Scheduled Lidocaine patches placed at patient's back. Continuous tube feeds running at goal rate - pt tolerating well, g-tube insertion site dressing CDI. Aspiration precautions maintained. Pt currently at rest without complaints, pt's daughter at bedside. Will CTM and notify MD regarding any acute changes in pt status.

## 2021-05-14 NOTE — Progress Notes
SOLID ONCOLOGY SERVICE PROGRESS NOTE     PATIENT:  Erika Kline   DATE OF SERVICE:  05/14/2021  HOSPITAL DAY:  4  CHIEF COMPLAINT:  Failure To Thrive (Pt w/ hx of esophageal cancer, sent by Dr Pollyann Kennedy for eval and admit to solid onc. +Covid)  ADMISSION CATEGORY:  Symptom Control    Last 24 Hour/Overnight Events:   - Noted dermatitis to skin under eyes, nursing staff noted patient was using Sani-cloth chemical wipes to eye area. Denies vision changes  - No acute events overnight     Subjective/Review of Systems:   - Noted ongoing irritation to eyes, eyes do not feel dry as prior. Nursing noted she is constantly rubbing under her eyes. Denies vision changes   - Ongoing intermittent back pain due to bed and gas pain, controlled with PRNs  - Heartburn overnight, improved with famotidine  - Denies CP, SOB, n/v, c/d, or abd pain     Medications:   Scheduled:  apixaban, 2.5 mg, Oral, BID  fentaNYL, 1 patch, Transdermal, Q72H  glycerin 35% saliva substitute (Oasis), 2 spray, Oral, Q6H  glycopyrrolate, 1 mg, Per G Tube, QHS  lidocaine, 2 patch, Transdermal, Q24H  metoprolol tartrate, 12.5 mg, Per G Tube, BID  pantoprazole, 40 mg, Per G Tube, BID  polyvinyl alcohol-povidone, 2 drop, Both Eyes, QID  PRN:  acetaminophen **OR** acetaminophen suppository, melatonin oral/enteral/sublingual, ondansetron **OR** ondansetron injection/IVPB, oxyCODONE, simethicone  Infusions:    Physical Exam:   Temp:  [36.5 ?C (97.7 ?F)-36.8 ?C (98.3 ?F)] 36.7 ?C (98 ?F)  Heart Rate:  [60-73] 69  Resp:  [16-18] 18  BP: (95-117)/(49-73) 102/61  NBP Mean:  [66-85] 72  SpO2:  [93 %-94 %] 94 %  I/O:  I/O last 2 completed shifts:  In: 1150 [NG/GT:1100; IV Piggyback:50]  Out: 450 [Urine:450]  General: alert, chronically ill appearing, NAD  Head: Atraumatic, normocephalic, PERRL, EOMI, mmm, pharynx normal without lesions.  Neck: supple, no significant adenopathy.  Heart: RRR normal S1, S2,  Lungs: normal work of breathing,clear to auscultation with no wheezes, rales or rhonchi  Abdomen: soft, ttp around the g-tube, nondistended, normal bowel sounds. g-tube with mild surrounding erythema but no drainage  Extremities: warm and well perfused; no lower extremity edema   Skin: normal coloration and turgor, no rashes, no suspicious skin lesions noted.  Neuro: AOx2 (disoriented to time and full situation) forgetful, normal speech    Data:   I have reviewed all of the labs from today. Pertinent labs include:   Lab Results   Component Value Date    WBC 6.47 05/14/2021    HGB 10.4 (L) 05/14/2021    HCT 32.0 (L) 05/14/2021    MCV 98.2 05/14/2021    PLT 293 05/14/2021     Lab Results   Component Value Date    CREAT 0.70 05/14/2021    BUN 24 (H) 05/14/2021    NA 137 05/14/2021    K 4.4 05/14/2021    CL 101 05/14/2021    CO2 29 05/14/2021     Micro:  Recent Results (from the past 168 hour(s))   Expedited COVID-19 and Influenza A B PCR, Respiratory Upper    Collection Time: 05/10/21  7:55 PM    Specimen: Nasopharyngeal; Respiratory, Upper   Result Value Ref Range    Specimen Type Respiratory, Upper     COVID-19 PCR/TMA Detected (A) Not Detected    Influenza A PCR Not Detected Not Detected    Influenza B PCR Not Detected  Not Detected   Candida Auris Surveillance PCR Unknown Foley: Nurse Protocol)    Collection Time: 05/11/21 12:07 AM    Specimen: Other, Enter source information; Skin   Result Value Ref Range    Candida auris DNA PCR Not Detected Not Detected   MRSA Surveillance, Nares    Collection Time: 05/11/21 12:07 AM    Specimen: Nares; Respiratory, Upper   Result Value Ref Range    MRSA Surveillance       No Methicillin-resistant Staphylococcus aureus isolated.     Imaging:  No imaging has been resulted in the last 24 hours    Problem-Based Assesment and Plan:   Erika Kline is a 87 y.o. female with recently diagnosed esophageal SCC, stomach lymphoma in remission (2005), PAF on apixaban, chronic diastolic dysfunction, nonrheumatic MR sp mitraclip, HTN/HLD, recent COVID19 infection who presents with failure to thrive.      # Failure to thrive: POA Likely related to recently diagnosed esophageal SCC with new G tube dependence after extended hospital stay, now with ongoing weight loss and largely bed bound.   - NPO  - F/U thiamine and zinc   - Continue tube feeds per RD rec Fibersource HN continuous, 60ml this am  - Free water flush   - PT/OT eval and treat  - DME FWW, shower chair ordered  - HH tube feeds ordered  - Consult to nutrition, appreciate recs- Request for bolus feeds  - Family/pt prefer DC home with [facility] and caregivers  - SW consulted for caregiver services      # COVID19 infection: POA Per daughter, patient tested positive on 12/03 during SNF outbreak. Initially with symptoms like congestion, mild cough, severe diarrhea, confusion, now improving. No respiratory symptoms currently or hypoxia noted. Symptomatically seems well controlled. COVID19 positive on admission 12/9.   - Supportive management   - Given symptoms improving, will hold off on anti-COVID therapies for now per unofficial ID consult     # Esophageal SCC with G tube dependence: Recently diagnosed during hospitalization 11/4. Has not started any cancer directed therapies yet. Has staging imaging (CT C/A/P w contrast) from 11/07 at OSH. Established care with Dr. Pollyann Kennedy on 12/5. CT chest (12/11): slightly enlarged mid esophageal posterior eccentric mass with circumferential esophageal wall invasion, currently in total 19 x 19 mm (3-47)-previously 19 x 14 mm (4-30). CTAP (12/11): no evidence of metastatic disease. (12/10): CEA=4.7 & CA19-9=28  - Will request pathology slides from [facility]; discussed with pathology secretary, will need to provide faxed letterhead with shipping information  - Radiation oncology was consulted; plan for outpatient CT Sim and consult with Dr. Carleene Cooper  - Per Dr. Pollyann Kennedy, defer chemotherapy given poor performance status  - Family request to consult hospice for information purposes for home based palliative care, consult to hospice order placed  - Will need follow-up with Dr. Pollyann Kennedy     # Cancer related pain: POA Due to above              - Continue fentanyl 12 mcg patch Q72H              - Continue oxycodone 5 mg Q6H PRN severe pain              - Continue tylenol 650 mg Q6H PRN pain  - Continue glycopyrrolate 1 mg at bedtime for secretions  - Continue simethicone PRN gas pains    # GERD, POA: Home PPI.  - Continue pantoprazole 40mg   BIDGT  -  Start famotidine 4mg  PO BID given persistent symptoms     # Dermatitis, nPOA: Local reaction. Suspect dermatitis to skin under eyes, nursing staff noted patient was using Sani-cloth chemical wipes to eye area. Denies vision changes or purulent drainage.  - Encourage patient not to touch eyes  - Calamine lotion to area under eye  - Change refresh drops to artificial tears ointment      # Normocytic anemia: nPOA suspect due to dilutional. hgb on admission 11.9, patient received 1L fluid bolus in ED.   - Hgb stable  - Transfuse for hgb <8  - Monitor for bleeding, pt has had scan hemoptysis prior to admission   - CBC daily    # Paroxysmal A fib: POA On frailty dosing apixaban outpatient. CHADSVASC 6 with hx of TIA.   - Continue apixaban 2.5mg  PO BID   - Monitor closely for bleeding given scant hemoptysis reported by family   - Continue home metoprolol tartrate 12.5 mg BID, hold SBP < 100 or HR < 50  - Can resume if hgb continues to be stable      # HTN: POA home metoprolol as above     # NSVT beats /PVCs, nPOA: Pacemaker. Asymptomatic, less than 10 beats. Noted on 12/13  - Stable with electrolyte repletion with no events >24 hours  - Stop cardiac monitoring   - Replete electrolytes K/Mg >4/2    # Cognitive decline: POA family reports patient becoming more forgetful since her diagnosis c/f onset of dementia and requesting inpatient work up. Patient is currently a/o x 4 and is able to answer questions regarding her recent hospitalization but forgets the details. Query component of delirium from prolonged hospitalization.   - Delirium precautions   - Will need outpatient neurology referral on discharge for appropriate workup   - CTM    Inpatient Checklist  Orders Placed This Encounter      Diet NPO Reason for NPO: Other (specify), Aspiration risk; Please specify: tube feed dependent      Diet tube feeding non bolus Gastrostomy (GT or G-tube); Fibersource HN at 41ml/hr, advance by 10 ml/hr q8h to goal 60 ml/hr x 24 hours    DVT Prophylaxis:  sequential compression devices (SCDs) apixiban  GI Prophylaxis:  indicated due to patient is on chronic acid suppression therapy as an outpatient: Proton pump inhibitor  Central Lines:  none  Tubes/Drains:  percutaneous gastric tube    Disposition  Continue inpatient management per above, discharge plan to [facility]    Code Status  DNR (No CPR, defibrillation, intubation),  Primary Emergency ContactMahaila, [place], Home Phone: [phone]    Patient was seen, examined, and plan was formulated/discussed with attending Dr.Tait Balistreri, [name]., MD     Author:    Nira Retort. Maisie Fus, NP   05/14/2021 7:31 AM    I have seen and examined the patient with the nurse practitioner.  I reviewed the patient's subjective symptoms, the objective physical laboratory findings, procedure data and have developed a treatment plan with her.  I agree with the findings and the plan of care as documented in her note.

## 2021-05-14 NOTE — Other
Patients Clinical Goal: Clinical Goal(s) for the Shift: tolerate g tube feeding , map>65 , spo2>92%,comfort and safety  Stability of the patient: Moderately Unstable - medium risk of patient condition declining or worsening    High fall risk factors:   Primary Language: English  Immunizations Needed: Up to date    Other/Significant Events:      Review of Systems  Neuro: Level of Consciousness: Alert Orientation Level: Oriented X4   Resp: O2 Device: None (Room air)   Cardiac:Normal sinus rhythm  GI/Endocrine:Flat, Soft Last BM Date: 05/11/21   Stool Appearance: Unable to assess   GU:  GU Method: Voiding  Ambulatory Status/Limitations:BMAT Level: Level 2 - [place]  Skin:Warm, Dry, Intact  Psychosocial: Psychosocial and/or Care Management Needs: Very High Frequency   What is most important to patient/family? : pt would like to get better  Was the patient/family goal completed this shift? Yes     Evaluation of Lines/Access  Peripheral IV Left;Posterior Hand (2)  GI Tube Non-ENFit Gastrostomy LLQ   If PICC, how many cm out?N/A    Procedures/Test/Consults  Done today: PT OT  Pending: none     Overview of Vitals/Critical Labs  Pain: back pain prn oxycodone given per order  Patient Vitals for the past 12 hrs:   BP Temp Temp src Pulse Resp SpO2   05/13/21 1623 111/69 36.7 C (98.1 F) Oral 60 16 93 %   05/13/21 1234 115/49 -- -- -- -- --   05/13/21 1133 95/58 36.5 C (97.7 F) Oral 68 16 94 %   05/13/21 0936 117/60 -- -- -- -- --   05/13/21 0751 96/56 36.8 C (98.3 F) Oral 70 16 93 %     Critical Labs: none , mg 1.7 replaced with 2 gm mg  Is the patient positive for severe sepsis/septic shock screen?: No    Review of Discharge Planning  Plan/goals: tolerate g tube feeding  Anticipated Date of Discharge: 05/17/2021    Anticipated Disposition: Home with home health

## 2021-05-15 LAB — Magnesium: MAGNESIUM: 1.8 meq/L (ref 1.4–1.9)

## 2021-05-15 LAB — Glucose,POC
GLUCOSE,POC: 100 mg/dL — ABNORMAL HIGH (ref 65–99)
GLUCOSE,POC: 101 mg/dL — ABNORMAL HIGH (ref 65–99)
GLUCOSE,POC: 85 mg/dL (ref 65–99)
GLUCOSE,POC: 94 mg/dL (ref 65–99)

## 2021-05-15 LAB — Phosphorus: PHOSPHORUS: 3.9 mg/dL (ref 2.3–4.4)

## 2021-05-15 LAB — Comprehensive Metabolic Panel
ALANINE AMINOTRANSFERASE: 11 U/L (ref 8–70)
ASPARTATE AMINOTRANSFERASE: 24 U/L (ref 13–62)

## 2021-05-15 LAB — CBC: RED CELL DISTRIBUTION WIDTH-CV: 15.4 (ref 11.1–15.5)

## 2021-05-15 LAB — Differential Automated: NEUTROPHIL PERCENT, AUTO: 55.9 (ref 0.20–0.80)

## 2021-05-15 MED ADMIN — PANTOPRAZOLE 40 MG/20 ML ORAL SUSPENSION: 40 mg | GASTROSTOMY | @ 19:00:00 | Stop: 2021-05-17 | NDC 00093001220

## 2021-05-15 MED ADMIN — ZINC SULFATE HEPTAHYDRATE (4.07 MG/ML ELEMENTAL ZINC) ORAL SOLUTION: 50 mg | GASTROSTOMY | @ 19:00:00 | Stop: 2021-05-17 | NDC 00904533260

## 2021-05-15 MED ADMIN — BISACODYL 10 MG RE SUPP: 10 mg | RECTAL | @ 19:00:00 | Stop: 2021-05-15 | NDC 00574705050

## 2021-05-15 MED ADMIN — APIXABAN 2.5 MG PO TABS: 2.5 mg | ORAL | @ 06:00:00 | Stop: 2021-05-17 | NDC 00003089331

## 2021-05-15 MED ADMIN — OXYCODONE HCL 5 MG/5ML PO SOLN: 5 mg | GASTROSTOMY | @ 04:00:00 | Stop: 2021-05-17 | NDC 00121482705

## 2021-05-15 MED ADMIN — LIDOCAINE 5 % EX PTCH: 2 | TRANSDERMAL | @ 09:00:00 | Stop: 2021-05-17

## 2021-05-15 MED ADMIN — GLYCOPYRROLATE 1 MG PO TABS: 1 mg | GASTROSTOMY | @ 06:00:00 | Stop: 2021-05-17 | NDC 60687045811

## 2021-05-15 MED ADMIN — SODIUM CHLORIDE 0.9% IV SOLN (500 ML): 10 mL/h | INTRAVENOUS | @ 19:00:00 | Stop: 2021-05-17 | NDC 00338004903

## 2021-05-15 MED ADMIN — ARTIFICIAL TEARS 83-15 % OP OINT: OPHTHALMIC | @ 07:00:00 | Stop: 2021-05-17

## 2021-05-15 MED ADMIN — GLYCERIN 35 % MT LIQD: 30 mL | ORAL | @ 21:00:00 | Stop: 2021-05-17

## 2021-05-15 MED ADMIN — MAGNESIUM SULFATE 2 GM/50ML IV SOLN: 2 g | INTRAVENOUS | @ 19:00:00 | Stop: 2021-05-15 | NDC 25021061281

## 2021-05-15 MED ADMIN — ARTIFICIAL TEARS 83-15 % OP OINT: OPHTHALMIC | @ 21:00:00 | Stop: 2021-05-17

## 2021-05-15 MED ADMIN — GLYCERIN 35 % MT LIQD: 30 mL | ORAL | @ 02:00:00 | Stop: 2021-05-17

## 2021-05-15 MED ADMIN — GLYCERIN 35 % MT LIQD: 30 mL | ORAL | @ 09:00:00 | Stop: 2021-05-17

## 2021-05-15 MED ADMIN — METOPROLOL TARTRATE 12.5 MG PO TABS: 12.5 mg | GASTROSTOMY | @ 06:00:00 | Stop: 2021-06-10 | NDC 57237010001

## 2021-05-15 MED ADMIN — PANTOPRAZOLE 40 MG/20 ML ORAL SUSPENSION: 40 mg | GASTROSTOMY | @ 06:00:00 | Stop: 2021-05-17 | NDC 00093001220

## 2021-05-15 MED ADMIN — ONDANSETRON HCL 4 MG/2ML IJ SOLN: 8 mg | INTRAVENOUS | @ 19:00:00 | Stop: 2021-05-17 | NDC 60505613000

## 2021-05-15 MED ADMIN — ARTIFICIAL TEARS 83-15 % OP OINT: OPHTHALMIC | @ 14:00:00 | Stop: 2021-05-17

## 2021-05-15 MED ADMIN — LIDOCAINE 5 % EX PTCH: 2 | TRANSDERMAL | @ 23:00:00 | Stop: 2021-05-17 | NDC 00591267911

## 2021-05-15 MED ADMIN — ARTIFICIAL TEARS 83-15 % OP OINT: OPHTHALMIC | @ 02:00:00 | Stop: 2021-05-17

## 2021-05-15 MED ADMIN — GLYCERIN 35 % MT LIQD: 30 mL | ORAL | @ 14:00:00 | Stop: 2021-05-17

## 2021-05-15 MED ADMIN — METOPROLOL TARTRATE 12.5 MG PO TABS: 12.5 mg | GASTROSTOMY | @ 19:00:00 | Stop: 2021-05-17 | NDC 57237010001

## 2021-05-15 MED ADMIN — FAMOTIDINE 20 MG PO TABS: 20 mg | ORAL | @ 19:00:00 | Stop: 2021-05-17 | NDC 60687059511

## 2021-05-15 MED ADMIN — APIXABAN 2.5 MG PO TABS: 2.5 mg | ORAL | @ 19:00:00 | Stop: 2021-05-17 | NDC 00003089331

## 2021-05-15 MED ADMIN — FAMOTIDINE 20 MG PO TABS: 20 mg | ORAL | @ 06:00:00 | Stop: 2021-06-13 | NDC 60687059511

## 2021-05-15 NOTE — Consults
Reason for Consult: Caregiver Resources      SW reviewed patient's medical chart. Tentative dispo plan is to go home w/ [facility] and hospice w/ family members e at [address] [place], [place] [address].    SW called patient's daughter [name] [phone]) to check in on home needs. [name] explained she is kind of confused if her mother is going to go home w/ hospice or palliative care based on hearing different things from different MDs. She also explained that there was a discussion of doing radiation for the cancer, but [name] expressed she is unsure if her mother is a candidate for radiation at this point and if so what is the value if she has only a short prognosis, etc. SW provided support and reached out to MD in radiation oncology requesting him to f/u w/ [name] when possible to answer her questions.     During PC, MD called [name], so SW will f/u w/ [name] later this morning to discuss caregiving resources.    Aura Fey, LCSW  Pager [phone]

## 2021-05-15 NOTE — Progress Notes
SOLID ONCOLOGY SERVICE PROGRESS NOTE     PATIENT:  Erika Kline   DATE OF SERVICE:  05/15/2021  HOSPITAL DAY:  5  CHIEF COMPLAINT:  Failure To Thrive (Pt w/ hx of esophageal cancer, sent by Dr Pollyann Kennedy for eval and admit to solid onc. +Covid)  ADMISSION CATEGORY:  Symptom Control    Last 24 Hour/Overnight Events:   - No acute events overnight    Subjective/Review of Systems:   - Intermittent eye irritation, skin under eyes do not seem as red per patient  - Ongoing intermittent back pain due to bed and gas pain, controlled with PRNs  - Denies CP, SOB, n/v, c/d, or abd pain     Medications:   Scheduled:  apixaban, 2.5 mg, Oral, BID  artificial tears, , Both Eyes, QID  famotidine, 20 mg, Oral, BID  fentaNYL, 1 patch, Transdermal, Q72H  glycerin 35% saliva substitute (Oasis), 2 spray, Oral, Q6H  glycopyrrolate, 1 mg, Per G Tube, QHS  lidocaine, 2 patch, Transdermal, Q24H  metoprolol tartrate, 12.5 mg, Per G Tube, BID  pantoprazole, 40 mg, Per G Tube, BID  zinc sulfate heptahydrate, 50 mg of elemental zinc, Per G Tube, Daily  PRN:  calamine-zinc oxide, melatonin oral/enteral/sublingual, ondansetron **OR** ondansetron injection/IVPB, oxyCODONE, simethicone  Infusions:    Physical Exam:   Temp:  [36.7 ?C (98 ?F)-37 ?C (98.6 ?F)] 36.8 ?C (98.3 ?F)  Heart Rate:  [65-74] 67  Resp:  [17-19] 17  BP: (108-117)/(63-72) 114/72  NBP Mean:  [76-85] 85  SpO2:  [93 %-96 %] 93 %  I/O:  I/O last 2 completed shifts:  In: 750 [NG/GT:750]  Out: 850 [Urine:850]  General: alert, chronically ill appearing, NAD  Head: Atraumatic, normocephalic, PERRL, EOMI, mmm, pharynx normal without lesions.  Neck: supple, no significant adenopathy.  Heart: RRR normal S1, S2,  Lungs: normal work of breathing,clear to auscultation with no wheezes, rales or rhonchi  Abdomen: soft, ttp around the g-tube, nondistended, normal bowel sounds. g-tube with mild surrounding erythema but no drainage  Extremities: warm and well perfused; no lower extremity edema   Skin: normal coloration and turgor, no rashes, no suspicious skin lesions noted.  Neuro: AOx2 (disoriented to time and full situation) forgetful, normal speech    Data:   I have reviewed all of the labs from today. Pertinent labs include:   Lab Results   Component Value Date    WBC 7.61 05/15/2021    HGB 10.7 (L) 05/15/2021    HCT 32.6 (L) 05/15/2021    MCV 98.2 05/15/2021    PLT 300 05/15/2021     Lab Results   Component Value Date    CREAT 0.69 05/15/2021    BUN 22 05/15/2021    NA 135 05/15/2021    K 4.2 05/15/2021    CL 98 05/15/2021    CO2 30 05/15/2021     Micro:  Recent Results (from the past 168 hour(s))   Expedited COVID-19 and Influenza A B PCR, Respiratory Upper    Collection Time: 05/10/21  7:55 PM    Specimen: Nasopharyngeal; Respiratory, Upper   Result Value Ref Range    Specimen Type Respiratory, Upper     COVID-19 PCR/TMA Detected (A) Not Detected    Influenza A PCR Not Detected Not Detected    Influenza B PCR Not Detected Not Detected   Candida Auris Surveillance PCR Unknown Foley: Nurse Protocol)    Collection Time: 05/11/21 12:07 AM    Specimen: Other, Enter source information; Skin  Result Value Ref Range    Candida auris DNA PCR Not Detected Not Detected   MRSA Surveillance, Nares    Collection Time: 05/11/21 12:07 AM    Specimen: Nares; Respiratory, Upper   Result Value Ref Range    MRSA Surveillance       No Methicillin-resistant Staphylococcus aureus isolated.     Imaging:  No imaging has been resulted in the last 24 hours    Problem-Based Assesment and Plan:   Erika Kline is a 87 y.o. female with recently diagnosed esophageal SCC, stomach lymphoma in remission (2005), PAF on apixaban, chronic diastolic dysfunction, nonrheumatic MR sp mitraclip, HTN/HLD, recent COVID19 infection who presents with failure to thrive.      Management issues are as follows:    # Esophageal SCC with G tube dependence: POA Recently diagnosed during hospitalization 11/4. Has not started any cancer directed therapies yet. Has staging imaging (CT C/A/P w contrast) from 11/07 at OSH. Established care with Dr. Pollyann Kennedy on 12/5. CT chest (12/11): slightly enlarged mid esophageal posterior eccentric mass with circumferential esophageal wall invasion, currently in total 19 x 19 mm (3-47)-previously 19 x 14 mm (4-30). CTAP (12/11): no evidence of metastatic disease. (12/10): CEA=4.7 & CA19-9=28  - Pathology slides requested from [facility] in [place] 05/15/21  - Radiation oncology was consulted; plan for outpatientconsult with Dr. Carleene Cooper  - Per Dr. Pollyann Kennedy, defer chemotherapy given poor performance status  - Hospice consult for palliative home based services  - Will need follow-up with Dr. Pollyann Kennedy     # Failure to thrive: POA Likely related to recently diagnosed esophageal SCC with new G tube dependence after extended hospital stay, now with ongoing weight loss and largely bed bound.   - G-tube feeds per below  - PT/OT treatment plan  - Family/pt prefer DC home with [facility] and caregivers  - DME FWW, shower chair ordered  - HH Nursing, PT/OT, HHA ordered  - SW consulted for caregiver services     # Dysphagia, POA:  #G-tube dependent, POA:   #Severe Malnutrition, POA: Due to newly diagnosed esophageal SCC. GT compelted full workup with his aspiration risk and obstruction due to mass, symptoms. Nursing swallow study on admission indicated aspiration and unsafe PO intake.   I have seen and examined the patient and agree with the RD assessment detailed below:  I have seen and examined the patient and agree with the RD assessment detailed below:  Patient meets criteria for: Severe Malnutrition    (current weight 45.6 kg (100 lb 8 oz), BMI (Calculated): 16.72; IBW: 56.7 kg (125 lb), % Ideal Body Weight: 77 %). See RD notes for additional details.  - Strict NPO  - F/U thiamine   - Zinc (12/11): low 52.9, start zinc supplementation for 2 week course  - Coninue tube feeds per RD rec Fibersource HN continuous, 60ml this am  - Free water flush   - HH tube feeds ordered; patient will need duration greater than 90 days  - Nutrition recs appreciated; equest for bolus feeds for discharge    # COVID19 infection: POA Per daughter, patient tested positive on 12/03 BinaxNOW Antigen during SNF outbreak. Initially with symptoms like congestion, mild cough, severe diarrhea, confusion, now improving. No respiratory symptoms currently or hypoxia noted. Symptomatically seems well controlled. COVID19 positive on admission 12/9. Given symptoms improving on admission, held off on anti-COVID therapies  per unofficial ID consult. Symptoms resolved. No oxygen use inpatient.   - COVID recovered per infection prevention    #  Cancer related pain: POA Due to above              - Continue fentanyl 12 mcg patch Q72H              - Continue oxycodone 5 mg Q6H PRN severe pain              - Continue tylenol 650 mg Q6H PRN pain  - Continue glycopyrrolate 1 mg at bedtime for secretions  - Continue simethicone PRN gas pains    # GERD, POA: Home PPI.  - Continue pantoprazole 40mg   BIDGT  - Continue famotidine 4mg  PO BID given persistent symptoms     # Dermatitis, nPOA: Local reaction. Suspect dermatitis to skin under eyes, nursing staff noted patient was using Sani-cloth chemical wipes to eye area. Denies vision changes or purulent drainage.  - Encourage patient not to touch eyes  - Calamine lotion to area under eye  - Continue artificial tears ointment      # Normocytic anemia: nPOA suspect due to dilutional. hgb on admission 11.9, patient received 1L fluid bolus in ED.   - Hgb stable  - Transfuse for hgb <8  - Monitor for bleeding, pt has had scan hemoptysis prior to admission   - CBC daily    # Paroxysmal A fib: POA On frailty dosing apixaban outpatient. CHADSVASC 6 with hx of TIA.   - Continue apixaban 2.5mg  PO BID   - Monitor closely for bleeding given scant hemoptysis reported by family   - Continue home metoprolol tartrate 12.5 mg BID, hold SBP < 100 or HR < 50     # HTN: POA home metoprolol as above     # NSVT beats /PVCs, nPOA: Pacemaker. Asymptomatic, less than 10 beats. Noted on 12/13. Stable with electrolyte repletion with no events >24 hours. Cardiac monitoring DC'ed.  - Replete electrolytes K/Mg >4/2    # Cognitive decline: POA family reports patient becoming more forgetful since her diagnosis c/f onset of dementia and requesting inpatient work up. Patient is currently a/o x 4 and is able to answer questions regarding her recent hospitalization but forgets the details. Query component of delirium from prolonged hospitalization.   - Delirium precautions   - Will need outpatient neurology referral on discharge for appropriate workup   - CTM    Inpatient Checklist  Orders Placed This Encounter      Diet NPO Reason for NPO: Other (specify), Aspiration risk; Please specify: tube feed dependent      Diet tube feeding non bolus Gastrostomy (GT or G-tube); Fibersource HN at 65ml/hr, advance by 10 ml/hr q8h to goal 60 ml/hr x 24 hours    DVT Prophylaxis:  sequential compression devices (SCDs) apixiban  GI Prophylaxis:  indicated due to patient is on chronic acid suppression therapy as an outpatient: Proton pump inhibitor  Central Lines:  none  Tubes/Drains:  percutaneous gastric tube    DME Attestation  This patient has unstable gait. I recommend a standard walker because patient?s mobility limits and impairs the MRADLs in the home. The standard walker will improve the patient?s overall activities of daily living. Patient can safely use the walker. The mobility limitation will be resolved with a standard walker. The mobility limitation can?t be resolved with the use of a cane or crutches.  Bartolo Darter NPI [id number]    Disposition  Continue inpatient management per above, discharge plan to [facility]    Code Status  DNR (No CPR, defibrillation, intubation),  Primary Emergency ContactCharizma, [place], Home Phone: [phone]    Patient was seen, examined, and plan was formulated/discussed with attending Dr.Julion Gatt, [name]., MD     Author:    Nira Retort. Maisie Fus, NP   05/15/2021 7:45 AM    I have seen and examined the patient with the nurse practitioner.  I reviewed the patient's subjective symptoms, the objective physical laboratory findings, procedure data and have developed a treatment plan with her.  I agree with the findings and the plan of care as documented in her note.

## 2021-05-15 NOTE — Other
Patient's Clinical Goal:   Clinical Goal(s) for the Shift: Pt remains stable, No falls or injuries, pain management  Identify possible barriers to advancing the care plan:     Stability of the patient: Moderately Unstable - medium risk of patient condition declining or worsening    Progression of Patient's Clinical Goal: patient alert and oriented x3, forgetful but easily reoriented, VSS, RA, Gtube in place with fibersource at 60gtt (goal rate), C/o pain in the beginning of the shift- oxycodone 5mg  given as ordered. Patient's bed in locked and low position, bed alarm on, call light with reach for safety, medications and orders carried out as prescribed, will continue to monitor patient for safety

## 2021-05-15 NOTE — Consults
IP CM ACTIVE DISCHARGE PLANNING  Department of Care Coordination      Admit [phone]  Anticipated Date of Discharge: 05/17/2021    Following OZ:DGUYQ, Erika Kline., MD      Today's short update     Plan DC tomorrow home   Discussed the following multiple arrangements with patient daughter Erika Kline Room [facility] notified to meet the daughter ,Erika Kline at the room or call her.  Per Barron Alvine this doesn?t hold the discharge and patient can still discharge and they can also follow palliative at home.  [address]  [place], [place] [address]  Phone: [phone]  Fax: [phone]    Re: DME : FWW ,  Shower chair   The FWW delivered and the daughter wants to purchase owned Paediatric nurse and notified [employer] to contact daughter Erika Kline. This shouldn?t hold the discharge.  [facility] - Notified regarding plan discharge tomorrow. Clarified with the daughter Erika Kline re: previous formula and informed me that it is at [facility] and [place] need to change it cause my mom is having a diarrhea. Informed [employer] re: update , waiting for MEDICARE Approval  [facility] notified re: discharge and secured      15:50 Barney from [employer] [facility] need the following re: medicare approval for the new formula and feeding pump(kangaroo) and supplies  Erika Kline at [facility]:  [facility], perfect timing.  Medicare review just came back.  This what we need for Medicare qualification:  Items necessary to determine if therapy is covered by Medicare:   1. Duration of enteral therapy (length of need) in a progress note.  Examples:  greater than 90 days, 6 months, 1 year, etc...    2. Dysphagia Documentation: Medical records indicating the severity of the dysphagia which may include the following: swallow study (if appropriate), speech therapy notes, nutrition assessments, and/or detailed progress notes indicating how much (if any) oral nutrition the patient can consume.    Please let us know when MD has completed, and send to [name] in aidin please, so we can submit for approval.  We will not be able to arrange until Medicare review is complete.  Thanks so much    NP [name] Notified prepare documentation's and order as requested by Medicare.    Disposition     Home with Home Health, Durable Medical Equipment, Palliative  [address] [facility] [place], [place] [address]  Family/Support System in agreement with the current discharge plan: Yes, in agreement and participating    Home Health Coordination Status (if applicable)     Agency accepted (6/6)   [facility]  [address], [address]  [place], [place] [address]  Phone: [phone]  Fax: [phone]      Home Infusion Coordination Status (if applicable)     Pharmacy accepted (6/6)      [facility]  [address]  [place], [place] [address]  Phone: [phone]  Fax: [phone]    DME or RT Equipment Status (if applicable)     DME Provider Status (5/6)    DME or RT Equipment Provider Status: Accepted        Palliative Care Status (if applicable)     Home palliative      [facility]  [address]  [place], [place] [address]  Phone: [phone]  Fax: [phone]  Erika Kline,  05/15/2021

## 2021-05-15 NOTE — Progress Notes
Physical Therapy Treatment      PATIENT: Erika Kline  MRN: [id number]    Treatment Date: 05/15/2021    Patient Presentation: Position: In bed;Bed alarm on;Family/CG present  Lines/devices Drains: Pulse Ox;G/J/Peg tube;HLIV  B/B Devices: External cath    Pertinent Updates: Hospice consult 12/13, plan to dc home w/ family    Precautions   Precautions: Fall risk;Monitor Vitals;Check Labs (Enhanced Droplet isol dc'd)  Orthotic: None  Current Activity Order: Order implies OOB  Weight Bearing Status: Not Applicable    Cognition   Cognition: At Baseline Cognitive Status  Following Commands: Follows one step commands with increased time;Follows one step commands with repetition  Safety Awareness: Good awareness of safety precautions  Barriers to Learning: Physical Limitations    Bed Mobility   Supine Scooting: Stand by Assist  Supine to Sit: Stand by Assist    Functional Mobility   Sit to Stand: Contact Guard Assist  Ambulation: Minimum Assist  Ambulation Distance (Feet): 4 sidesteps to HOB, limtied 2/2 c/o dizziness and hypotension  Gait Pattern: Unsteady;Shuffled;Decreased pace  Assistive Device: Front wheeled walker     Exercises   Ankle Pumps: Active;Bilateral;10 Reps  Seated Knee Flex/Ext: Active;Bilateral;10 Reps     Pain Assessment   Patient complains of pain: No    Patient Status   Activity Tolerance: Poor  Oxygen Needs: Room Air  Response to Treatment: Dizzy;Fatigued;Nursing notified  Compliance with Precautions: Not Applicable  Call light in reach: Yes  Presentation post treatment: In bed;Bed alarm on;Lines/drains intact;Pulse Ox;Family/CG present;Heels floated  Comments: BP in supine: 116/25mmHg, sitting EOB: 121/72, standing: 99/84, 2nd sitting: 109/71.  Attempted to measure 2nd standing BP, but pt opted to sit down EOB.  Educated pt and family re: home safety precaution and stair negotiation . Pt has 2 step down and pt to have 24 hr care.    Interdisciplinary Communication   Interdisciplinary Communication: Nurse;Certified Occupational Therapy Assistant (Audria Nine, RN; TB, COTA)    Treatment Plan   Continue PT Treatment Plan with Focus on: Gait training;Stair training;Balance training    PT Recommendations   Discharge Recommendation: Physical Therapy;Would benefit from continued therapy  Discharge concerns: Requires assistance for mobility;Requires assistance for self care  Discharge Equipment Recommended: Defer to discharge facility;Owns recommended DME    Treatment Completed by: Minda Meo, PTA

## 2021-05-15 NOTE — Consults
NUTRITION RE-ASSESSMENT (Adult)    Admit Date: 05/10/2021 Date of Birth: June 06, 1933 Gender: female MRN: [id number]     Date of Assessment: 05/15/2021   Status: Reassesment   Indication: BMI < or equal to 18.5, Enteral nutrition   Subjective: Spoke w/ pt and pt's daughter at bedside. Noted that TF at current goal rate of 60 ml/hr. Pt reports good TF tolerance. Reports nuasea this AM which resolved w/ zofran and loose BMs. Reports LBM 12/10. Paged by NP [name] for  Bolus TF recs for d/c. Pt's daughter asking questions about being provided w/ syringes, water to flush, and how to clamp syringe for air bubble - deferred to medical team to address.     Problems: Principal Problem:    Failure to thrive in adult POA: Yes  Active Problems:    Weakness generalized POA: Yes       MD H&P 05/10/2021:   87 y.o. 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   ?  History gathered from patient and daughter.   ?  Daughter notes that patient was recently diagnosed in November of this year (please see Onc history below). After her initial hospital admission (diagnosis, G tube placement, c/b by abscess formation) the patient went to SNF rehab on 11/30. Completed antibiotics on 11/30. Got COVID on 12/03, symptoms included diarrhea, headache, diffuse weakness, with confusion. still COVID +. Still awaiting a PET CT for staging.   ?  Currently has occasionally scant bloody salivary output, sometimes with clots. Daughter reports recently patient has coughed up small pieces of tissue ''like fish scales'', feels may be tumor. The coughing up blood/tissue started around 11/30. Does not tolerate any oral intake (high aspiration per daughter), so everything via GT.  Recently diagnosed with COVID on 12/03, symptoms included diarrhea, headache, diffuse weakness, with confusion, all currently improving. No fevers, chills, chest pain, SOB. Patient is largely bedbound currently. Previously was very active prior to her diagnosis. On tube feeds at home (Tube feeding from 2PM to 10 AM, 65 mL/hr Jevity 1.2 cal complete balanced).     Past Medical History:   Diagnosis Date   ? Allergy, unspecified not elsewhere classified    ? Arthritis    ? Basal cell carcinoma    ? Cancer (HCC/RAF)     Stomach Cancer S/P chemo   ? Depression    ? Gastric lymphoma (HCC/RAF)    ? Gastric lymphoma (HCC/RAF)    ? Gastric lymphoma (HCC/RAF)    ? Hyperlipidemia    ? Hypertension     Past Surgical History:   Procedure Laterality Date   ? breast implants  1978   ? BREAST SURGERY             Data   Intake/Outputs: I/O last 2 completed shifts:  In: 750 [NG/GT:750]  Out: 850 [Urine:850]    Pertinent Medications:   Scheduled Meds:  ? apixaban  2.5 mg Oral BID   ? artificial tears   Both Eyes QID   ? famotidine  20 mg Oral BID   ? fentaNYL  1 patch Transdermal Q72H   ? glycerin 35% saliva substitute ([facility])  2 spray Oral Q6H   ? glycopyrrolate  1 mg Per G Tube QHS   ? lidocaine  2 patch Transdermal Q24H   ? metoprolol tartrate  12.5 mg Per G  Tube BID   ? pantoprazole  40 mg Per G Tube BID   ? zinc sulfate heptahydrate  50 mg of elemental zinc Per G Tube Daily     Continuous Infusions:  ? sodium chloride 10 mL/hr (05/15/21 1059)     PRN Meds:.calamine-zinc oxide, melatonin oral/enteral/sublingual, ondansetron **OR** ondansetron injection/IVPB, oxyCODONE, simethicone    FDI Target Drugs: No      Pertinent Labs:   Recent Labs     05/15/21  0555   NA 135   K 4.2   BUN 22   CREAT 0.69   GLUCOSE 91   MG 1.8   CALCIUM 8.5*   PHOS 3.9   ALBUMIN 2.9*   WBC 7.61   HGB 10.7*   HCT 32.6*   BILITOT 0.4   AST 24   ALT 11   ALKPHOS 120*     Accu-Chek:   Recent Labs     05/14/21  0534 05/14/21  1057 05/14/21  1744 05/14/21  2244 05/15/21  0626 05/15/21  1056   GLUCOSEPOC 102* 112* 100* 101* 85 94     Respiratory Status / O2 Device: None (Room air)    Pressure Injury:     Patient Lines/Drains/Airways Status     Active Pressure Ulcers     None              Additional Data: N/A     Diet Info   ? Allergies:   Amlodipine besylate and Clarithromycin  ? Cultural/Ethnic/Religious/Other Food Preferences:  None     ? Nutrition prior to admit:  TF from 2pm-10am: receiving Jevity 1.2 at 65 ml/hr. Bolus feeds: 5 cartons per day at 2pm, 5pm, 8pm, 11pm, 8am. Provides: 1185 ml formula, 1425 kcals, 66 g protein, and 955 ml free water. Previously on continuous feeds for the past 4 weeks and was tolerating fine per daughter. FWF of 30ml water before and after each feed.  ? Current diet order:     Diets/Supplements/Feeds   Diet    Diet NPO Reason for NPO: Other (specify), Aspiration risk; Please specify: tube feed dependent     Start Date/Time: 05/10/21 2200      Number of Occurrences: Until Specified     Order Questions:     ? Reason for NPO Other (specify)     ? Reason for NPO Aspiration risk     ? Please specify tube feed dependent   Nourishments    Diet tube feeding non bolus Gastrostomy (GT or G-tube); Fibersource HN at 62ml/hr, advance by 10 ml/hr q8h to goal 60 ml/hr x 24 hours     Start Date/Time: 05/11/21 1440      Number of Occurrences: Until Specified     Order Comments: at 65ml/hr, advance by 10 ml/hr q8h to goal 60 ml/hr x 24 hours       Order Questions:     ? Route/Method of Feeding: Gastrostomy (GT or G-tube)     ? Adult Enteral Formulas: Fibersource HN     ? PO % consumed: NPO (w TFs) w/ TFs  ? Parenteral Nutrition: N/A  ? Enteral Nutrition: Fibersource HN @ 60 ml/hr x24 hrs  ? Other caloric sources: N/A    Anthropometrics:  Height: 165.1 cm (5' 5'')  Admit Weight: 45.4 kg (100 lb) (05/10/21 1754) Last 5 recorded weights:  Weights 10/04/2015 05/10/2021 05/11/2021 05/11/2021 05/12/2021   Weight 48.7 kg 45.4 kg 43.9 kg 45.6 kg 45.6 kg  IBW: 56.7 kg (125 lb)  % Ideal Body Weight: 77 %  BMI (Calculated): 16.72             Wt Readings from Last 15 Encounters:   05/12/21 45.6 kg (100 lb 8 oz)   10/04/15 48.7 kg (107 lb 6.4 oz)   08/28/15 50.3 kg (111 lb)   06/07/15 50.8 kg (112 lb)   05/17/13 49.9 kg (110 lb)   01/06/13 47.2 kg (104 lb)   11/25/12 48.1 kg (106 lb)   04/07/12 49.9 kg (110 lb)   03/05/12 50.9 kg (112 lb 3.2 oz)   02/05/12 50.4 kg (111 lb 3.2 oz)   10/14/11 50.9 kg (112 lb 3.4 oz)      Estimated Nutrition Needs   Based on current weight of 43.9 kg - adj iso severe malnutrition  1537-1756 kcals/day (35-40 kcals/kg)  66-88 g protein/day (1.5-2.0 g protein/kg)     Diet Education   Teaching provided (Refer to Patient Education records)      05/11/2021: discussed tube feeding regimen: continuous vs bolus in-house; discussed tube feeding formula (explained that we do not carry Jevity 1.2; however, we do have a formula very similar to it that we can trial). Daughter expressed understanding and had no further questions at this time.     Malnutrition Assessment   Malnutrition in the Context of: Chronic Illness/Mild-Moderate Inflammation   Energy Intake: Does not meet criterion  Weight Loss: Does not meet criterion     Nutrition-Focused Physical Exam: 05/15/2021  Severe decrease of muscle and fat as evidenced by observation    Nutrition-Focused Physical Exam: 05/11/2021  Not performed: as patient positive for COVID-19; deferred in an effort to limit contact and mitigate the spread of COVID-19.      Patient meets criteria for: Severe Malnutrition    Nutrition Assessment   Anthropometrics: Body mass index is 16.72 kg/m?Marland Kitchen Indicating very low for age; desired range of BMI >23 and <30 for Geriatric patient. No clinically significant weight changes per EMR. Daughter endorses weight loss within past week. Patient previously 100 lbs ~7 days ago per daughter. Unable to complete NFPE at this time.    Nutrition: NPO w/ TFs (started on 12/10) of Fibersource HN @ 60 ml/hr x24 hrs. x4 day TF avg: 683 ml formula, 819 kcal, 37 g pro, 551 ml H2O, 54.6% RDIs - met 54% estimated low end kcal needs and 56% estimated pro needs.  TF from 2pm-10am: receiving Jevity 1.2 at 65 ml/hr. Bolus feeds: 5 cartons per day at 2pm, 5pm, 8pm, 11pm, 8am. Provides: 1185 ml formula, 1425 kcals, 66 g protein, and 955 ml free water. Previously on continuous feeds for the past 4 weeks and was tolerating fine per daughter. FWF of 30ml water before and after each feed. Patient meeting 93% of estimated lower energy needs and 100% of estimated lower protein needs on home TF regimen.    Tolerance/GI: Per RN flowsheet: Abdomen Inspection: Flat, Soft, Nondistended. LBM: 12/10 per RN flowsheet. Nausea this AM now resolved, no V/C/D/abdominal pain per pt. With very watery stools since COVID-19 infection.    Skin: x3 wounds (scarum, perineum, L lumbar), G tube, PIV    Labs:   Glucose POC x 24 hours: 85-112 WNL  Low Ca, total protein  Elevated alk phos   05/12/2021: zinc 52.9 (L)  *thiamine lab pending    Meds: ppi,  50 mg elemental zinc, mag sulfate iv replete, prn simethicone, prn zofran     Recommendations / Care  Plan    1. Enteral Nutrition Recs: Continue Fibersource HN at goal 60 ml/hr x 24 hours   = 1440 ml formula, 1728 kcals, 78 g protein, 1164 ml free water, and meets >100% DRIs for key vitamins and minerals   - Free water flushes per MD or 140 ml q6h   - Monitor phos, magnesium and K+ as nutrition support advanced at least BID, replace prn; follow magnesium closely as is a cofactor for thiamine    For bolus feeds for d/c:   -Fibersource HN q4hrs from 8am-8pm:    =1440 ml formula, 1728 kcals, 78 g protein, 1164 ml free water, and meets >100% DRIs for key vitamins and minerals    2. Rec'd check Vitamin D level to assess need for supplementation  3. Continue to provide w/ 50 mg elemental zinc for x13 days - suggest d/c zinc at day 14 of supplementation (12/27) and checking zinc and copper levels.  4. Monitor EN intake/tolerance, lytes/BG/labs, frequency and consistency of bowel movements, and weight trends  5. Obtain weekly weights as able please    Will follow-up per nutrition protocol.     Author: Latina Craver, RD, Pager [phone]  05/15/2021 1:02 PM

## 2021-05-15 NOTE — Consults
IP CM ACTIVE DISCHARGE PLANNING  Department of Care Coordination      Admit [phone]  Anticipated Date of Discharge: 05/17/2021    Following FV:CBSWH, Lilia Argue., MD      Today's short update     Spoke with daughter, Myriam Jacobson [phone]) in regard to discharge planning needs. Patient with needs for DME (FWW, shower chair) with referral sent. Patient daughter aware [facility]WW a covered DME and shouwer chair will need to pay out-of-pocket with cost of $51.56 or can be obtained independently. Patient with need for home based palliative, patient daughter aware and agreed with plan for referral sent to [facility] and currently under review. Patient with need for SN/PT/OT/HHA and tube feedin supply provider, freedom of choice given and no preference. Referral place and currenlty under review for HHA and has been accepted with [facility] for tube feeding supplies. Patient per daughter will be residing with family member at discharge at [place], [place] [address], HHA and [facility] made aware of the updted address.    Disposition     Home with Home Health, Palliative, [address] [address], [place] [address]  Family/Support System in agreement with the current discharge plan: Yes, in agreement and participating    [facility] Status (if applicable)     Referral under review (4/6), Choices presented to patient (5/6)    Home Infusion Coordination Status (if applicable)     Pharmacy accepted (6/6)        DME or RT Equipment Status (if applicable)     DME Provider Status (5/6)    DME or RT Equipment Provider Status: Accepted    Lynnda Shields,  79/39/0300

## 2021-05-15 NOTE — Other
Patient's Clinical Goal: rest  Clinical Goal(s) for the Shift: vss comfort  Identify possible barriers to advancing the care plan: none  Stability of the patient: Moderately Unstable - medium risk of patient condition declining or worsening    Progression of Patient's Clinical Goal:     Plan of Care   Continuous feedings at 60cc  Q4 30cc water flushes  Pain management for eyes      Notable/significant events and interventions  none    Additional Notes   Patient is oriented x 3  Patient c/o of IV and eye pain. Pain Managed with oxycodone  Cardiac:NSR  Resp: RA no complaints of SOB  GI: continuous feedings  GU: primafit  BMAT 1. Pt ambulates with weak gait needs assistance.     Review of Discharge Planning  Plan/goals for discharge: ongoing   Barriers:None     Handoff Checklist - Completed at every change of shift  Central Line  no  Foley Catheter  no  High-risk drugs independently verified yes  Sepsis screen reviewed and completed  yes  Pressure injury  no  RN/MD Rounding? yes

## 2021-05-15 NOTE — Progress Notes
Occupational Therapy Treatment    PATIENT: Sheena Donegan  MRN: [id number]    Treatment Date: 05/15/2021    Patient Presentation: Position: In bed;Bed alarm on  Lines/devices Drains: HLIV;G/J/Peg tube;Cardiac Monitor;Pulse Ox  B/B Devices: External cath    Pertinent Updates: Noted d/c plan is tentative for home w/ family support, HH and hospice.    Precautions   Precautions: Fall risk;Monitor Vitals;Check Labs;Isolation (Enhanced Droplet and Contact Precautions)  Orthotic: None  Current Activity Order: Order implies OOB  Weight Bearing Status: Not Applicable    Cognition   Cognition: Exceptions to WDL or Baseline Status  Arousal/Alertness: Appropriate responses to stimuli  Attention Span: Attends with cues to redirect  Memory: Appears intact  Orientation Level: Oriented to person;Oriented to place;Oriented to situation  Following Commands: Follows one step commands with repetition  Problem Solving: Assistance required to generate solutions  Sequencing: Intact  Initiation: Good  Safety Awareness: Fair awareness of safety precautions  Barriers to Learning: Hard of Hearing    Bed Mobility   Supine to Sit: Contact Guard Assist  Sit to Supine: Contact Guard Assist    Functional Transfers   Sit to Stand: Contact Guard Assist  Functional Mobility:  (deferred due to pt w/ c/o nausea/noted overall body shakiness)    Activities of Daily Living (ADLs)   LB Dressing: Performed  LB Dressing Assistance: Moderate Assist  LB Dressing Deficit: Verbal cueing;Supervision/safety;Thread RLE into pants;Thread LLE into pants (Pt resistant to provide much assistance  reporting, ''someone will help me with this'')  LB Dressing Adaptive Equipment: None  LB Dressing Where Assessed: Edge of bed;Standing    Balance   Sitting - Static: Good  Sitting - Dynamic: Good       Pain Assessment   Patient complains of pain: No       Patient Status   Activity Tolerance: Fair  Oxygen Needs: Room Air  Response to Treatment: Nausea;Vital signs stable;Nursing notified  Compliance with Precautions: Not Applicable  Call light in reach: Yes  Presentation post treatment: In bed;On cardiac monitor;Bed alarm on;Lines/drains intact;Pulse Ox  Comments: Pt eager to mobilize however, limited this am by c/o nausea, observed whole body shakiness once in standing.  BP stable throughout (SBP 100's).  Encouraged pt, for OOB w/ staff when feeling better.  RN updated.    Interdisciplinary Communication   Interdisciplinary Communication: Nurse;Occupational Therapist    Treatment Plan   Continue OT Treatment Plan with Focus on: ADL training;Functional mobility training;Patient/family/caregiver education and training;Energy conservation;Assistive device training    OT Recommendations   Discharge Recommendation: Would benefit from continued therapy  Discharge concerns: Requires assistance for self care;Requires assistance for mobility  Discharge Equipment Recommended: Defer to discharge facility;Walker;Paediatric nurse;If patient dc home instead of rehab as recommended  Equipment ordered: MD aware of need to place order    Treatment Completed by: Sula Soda. Kervens Roper, COTA

## 2021-05-16 ENCOUNTER — Telehealth: Payer: MEDICARE

## 2021-05-16 LAB — Glucose,POC
GLUCOSE,POC: 110 mg/dL — ABNORMAL HIGH (ref 65–99)
GLUCOSE,POC: 131 mg/dL — ABNORMAL HIGH (ref 65–99)
GLUCOSE,POC: 91 mg/dL (ref 65–99)
GLUCOSE,POC: 93 mg/dL (ref 65–99)

## 2021-05-16 LAB — Comprehensive Metabolic Panel: ESTIMATED GFR 2021 CKD-EPI: 86 mL/min/{1.73_m2} (ref 0.1–1.2)

## 2021-05-16 LAB — Magnesium: MAGNESIUM: 1.8 meq/L (ref 1.4–1.9)

## 2021-05-16 LAB — Differential Automated: IMMATURE GRANULOCYTES%: 0.5 (ref 1.30–3.40)

## 2021-05-16 LAB — CBC: MEAN CORPUSCULAR HEMOGLOBIN: 31.2 pg (ref 26.4–33.4)

## 2021-05-16 LAB — Phosphorus: PHOSPHORUS: 4.1 mg/dL (ref 2.3–4.4)

## 2021-05-16 MED ORDER — GLYCOPYRROLATE 1 MG PO TABS
1 mg | ORAL_TABLET | Freq: Every evening | GASTROSTOMY | 0 refills | 30.00 days | Status: AC
Start: 2021-05-16 — End: ?
  Filled 2021-05-17: qty 30, 30d supply, fill #0

## 2021-05-16 MED ORDER — FENTANYL 12 MCG/HR TD PT72
1 | MEDICATED_PATCH | TRANSDERMAL | 0 refills | 15.00 days | Status: AC
Start: 2021-05-16 — End: ?
  Filled 2021-05-17: qty 5, 15d supply, fill #0

## 2021-05-16 MED ORDER — CALAMINE-ZINC OXIDE 8-8 % EX LOTN
Freq: Four times a day (QID) | TOPICAL | 0 refills | PRN
Start: 2021-05-16 — End: ?

## 2021-05-16 MED ORDER — ARTIFICIAL TEARS 83-15 % OP OINT
Freq: Four times a day (QID) | OPHTHALMIC | 0 refills | 30.00 days | Status: AC
Start: 2021-05-16 — End: ?
  Filled 2021-05-17: qty 3.5, 30d supply, fill #0

## 2021-05-16 MED ORDER — OXYCODONE HCL 5 MG/5ML PO SOLN
5 mg | Freq: Four times a day (QID) | GASTROSTOMY | 0 refills | 14.00000 days | Status: AC | PRN
Start: 2021-05-16 — End: ?
  Filled 2021-05-17: qty 280, 14d supply, fill #0

## 2021-05-16 MED ORDER — APIXABAN 2.5 MG PO TABS
2.5 mg | ORAL_TABLET | Freq: Two times a day (BID) | ORAL | 0 refills | 30.00 days | Status: AC
Start: 2021-05-16 — End: ?

## 2021-05-16 MED ORDER — METOPROLOL TARTRATE 25 MG PO TABS
12.5 mg | ORAL_TABLET | Freq: Two times a day (BID) | GASTROSTOMY | 0 refills | 30.00000 days | Status: AC
Start: 2021-05-16 — End: ?
  Filled 2021-05-17: qty 30, 30d supply, fill #0

## 2021-05-16 MED ORDER — ONDANSETRON HCL 8 MG PO TABS
8 mg | ORAL_TABLET | Freq: Three times a day (TID) | GASTROSTOMY | 0 refills | 30.00 days | Status: AC | PRN
Start: 2021-05-16 — End: 2021-05-21
  Filled 2021-05-17: qty 90, 30d supply, fill #0

## 2021-05-16 MED ORDER — FAMOTIDINE 20 MG PO TABS
20 mg | ORAL_TABLET | Freq: Two times a day (BID) | GASTROSTOMY | 0 refills | 30 days | Status: AC
Start: 2021-05-16 — End: ?
  Filled 2021-05-17: qty 60, 30d supply, fill #0

## 2021-05-16 MED ORDER — PANTOPRAZOLE 2 MG/ML ORAL SUSPENSION (ADULT)
40 mg | Freq: Two times a day (BID) | ORAL | 0 refills | 20.00000 days | Status: AC
Start: 2021-05-16 — End: 2021-05-30
  Filled 2021-05-17: qty 325, 8d supply, fill #0

## 2021-05-16 MED ORDER — ZINC SULFATE HEPTAHYDRATE (4.07 MG/ML ELEMENTAL ZINC) ORAL SOLUTION
50 mg | Freq: Every day | GASTROSTOMY | 0 refills | 10.00000 days | Status: AC
Start: 2021-05-16 — End: ?
  Filled 2021-05-24: qty 123, 10d supply, fill #0

## 2021-05-16 MED ADMIN — ARTIFICIAL TEARS 83-15 % OP OINT: OPHTHALMIC | @ 08:00:00 | Stop: 2021-05-17

## 2021-05-16 MED ADMIN — APIXABAN 2.5 MG PO TABS: 2.5 mg | ORAL | @ 04:00:00 | Stop: 2021-05-17 | NDC 00003089331

## 2021-05-16 MED ADMIN — GLYCERIN 35 % MT LIQD: 30 mL | ORAL | @ 02:00:00 | Stop: 2021-06-12

## 2021-05-16 MED ADMIN — GLYCERIN 35 % MT LIQD: 30 mL | ORAL | @ 19:00:00 | Stop: 2021-05-17

## 2021-05-16 MED ADMIN — CALAMINE-ZINC OXIDE 8-8 % EX LOTN: 177 mL | TOPICAL | @ 19:00:00 | Stop: 2021-05-17

## 2021-05-16 MED ADMIN — ONDANSETRON HCL 4 MG/2ML IJ SOLN: 8 mg | INTRAVENOUS | @ 21:00:00 | Stop: 2021-05-17 | NDC 60505613000

## 2021-05-16 MED ADMIN — METOPROLOL TARTRATE 12.5 MG PO TABS: 12.5 mg | GASTROSTOMY | @ 18:00:00 | Stop: 2021-05-17 | NDC 57237010001

## 2021-05-16 MED ADMIN — OXYCODONE HCL 5 MG/5ML PO SOLN: 5 mg | GASTROSTOMY | @ 07:00:00 | Stop: 2021-05-22 | NDC 00121482705

## 2021-05-16 MED ADMIN — ARTIFICIAL TEARS 83-15 % OP OINT: OPHTHALMIC | @ 13:00:00 | Stop: 2021-05-17

## 2021-05-16 MED ADMIN — LIDOCAINE 5 % EX PTCH: 2 | TRANSDERMAL | @ 12:00:00 | Stop: 2021-05-17

## 2021-05-16 MED ADMIN — ACETAMINOPHEN 32 MG/ML PO LIQUID: 650 mg | GASTROSTOMY | @ 04:00:00 | Stop: 2021-05-17 | NDC 00121197121

## 2021-05-16 MED ADMIN — ACETAMINOPHEN 32 MG/ML PO LIQUID: 650 mg | GASTROSTOMY | @ 18:00:00 | Stop: 2021-05-17 | NDC 00121197121

## 2021-05-16 MED ADMIN — PANTOPRAZOLE 40 MG/20 ML ORAL SUSPENSION: 40 mg | GASTROSTOMY | @ 18:00:00 | Stop: 2021-05-17 | NDC 00093001220

## 2021-05-16 MED ADMIN — GLYCERIN 35 % MT LIQD: 30 mL | ORAL | @ 07:00:00 | Stop: 2021-05-17

## 2021-05-16 MED ADMIN — OXYCODONE HCL 5 MG/5ML PO SOLN: 5 mg | GASTROSTOMY | @ 01:00:00 | Stop: 2021-05-17 | NDC 00121482705

## 2021-05-16 MED ADMIN — FAMOTIDINE 20 MG PO TABS: 20 mg | ORAL | @ 04:00:00 | Stop: 2021-05-17 | NDC 60687059511

## 2021-05-16 MED ADMIN — MELATONIN 3 MG PO TABS: 3 mg | ORAL | @ 04:00:00 | Stop: 2021-05-17 | NDC 50268052411

## 2021-05-16 MED ADMIN — FAMOTIDINE 20 MG PO TABS: 20 mg | ORAL | @ 18:00:00 | Stop: 2021-05-17 | NDC 60687059511

## 2021-05-16 MED ADMIN — LIDOCAINE 5 % EX PTCH: 2 | TRANSDERMAL | Stop: 2021-05-17 | NDC 00591352530

## 2021-05-16 MED ADMIN — OXYCODONE HCL 5 MG/5ML PO SOLN: 5 mg | GASTROSTOMY | @ 19:00:00 | Stop: 2021-05-17 | NDC 00121482705

## 2021-05-16 MED ADMIN — ARTIFICIAL TEARS 83-15 % OP OINT: OPHTHALMIC | @ 02:00:00 | Stop: 2021-05-17

## 2021-05-16 MED ADMIN — ACETAMINOPHEN 32 MG/ML PO LIQUID: 650 mg | GASTROSTOMY | @ 12:00:00 | Stop: 2021-05-17 | NDC 00121197121

## 2021-05-16 MED ADMIN — PANTOPRAZOLE 40 MG/20 ML ORAL SUSPENSION: 40 mg | GASTROSTOMY | @ 04:00:00 | Stop: 2021-05-17 | NDC 00093001220

## 2021-05-16 MED ADMIN — CALAMINE-ZINC OXIDE 8-8 % EX LOTN: 177 mL | TOPICAL | @ 19:00:00 | Stop: 2021-05-17 | NDC 00904253321

## 2021-05-16 MED ADMIN — CALAMINE-ZINC OXIDE 8-8 % EX LOTN: 177 mL | TOPICAL | @ 14:00:00 | Stop: 2021-05-17 | NDC 00904253321

## 2021-05-16 MED ADMIN — ZINC SULFATE HEPTAHYDRATE (4.07 MG/ML ELEMENTAL ZINC) ORAL SOLUTION: 50 mg | GASTROSTOMY | @ 19:00:00 | Stop: 2021-05-17 | NDC 00904533260

## 2021-05-16 MED ADMIN — APIXABAN 2.5 MG PO TABS: 2.5 mg | ORAL | @ 18:00:00 | Stop: 2021-05-17 | NDC 00003089331

## 2021-05-16 MED ADMIN — GLYCERIN 35 % MT LIQD: 30 mL | ORAL | @ 13:00:00 | Stop: 2021-05-17

## 2021-05-16 MED ADMIN — OXYCODONE HCL 5 MG/5ML PO SOLN: 5 mg | GASTROSTOMY | @ 14:00:00 | Stop: 2021-05-17 | NDC 00121482705

## 2021-05-16 MED ADMIN — GLYCOPYRROLATE 1 MG PO TABS: 1 mg | GASTROSTOMY | @ 04:00:00 | Stop: 2021-05-17 | NDC 00904670961

## 2021-05-16 MED ADMIN — METOPROLOL TARTRATE 12.5 MG PO TABS: 12.5 mg | GASTROSTOMY | @ 04:00:00 | Stop: 2021-05-17 | NDC 57237010001

## 2021-05-16 MED ADMIN — ARTIFICIAL TEARS 83-15 % OP OINT: OPHTHALMIC | @ 19:00:00 | Stop: 2021-05-17

## 2021-05-16 NOTE — Consults
IP CM ACTIVE DISCHARGE PLANNING  Department of Care Coordination      Admit [phone]  Anticipated Date of Discharge: 05/16/2021    Following UJ:[id number], [name]., MD      Today's short update     Per NP patient to DC today with [facility]. HH confirmed and agency updated regarding SOC. CM will continue to follow.    Disposition     Home with Home Health, Durable Medical Equipment, Palliative  [address] [facility] [place], [place] [address]  Family/Support System in agreement with the current discharge plan: Yes, in agreement and participating    Home Health Coordination Status (if applicable)     Agency accepted (6/6)    Home Infusion Coordination Status (if applicable)     Pharmacy accepted (6/6)         DME or RT Equipment Status (if applicable)     DME Provider Status (5/6)    DME or RT Equipment Provider Status: Accepted    Facility Transfer/Placement Status (if applicable)          Medical Transport Arrangement Status (if applicable)               Non-medical Transportation Arrangement Status (if applicable)               New Hemodialysis Status (if applicable)          Resumption of Hemodialysis Status (if applicable)          Palliative Care Status (if applicable)     Home palliative      Hospice Coordination Status (if applicable)               Other Arrangements (if applicable)                                 Hilary Hertz,  05/16/2021

## 2021-05-16 NOTE — Other
Patient's Clinical Goal:   Clinical Goal(s) for the Shift: vss comfort  Identify possible barriers to advancing the care plan: age, mobility, prognosis  Stability of the patient:stable  Progression of Patient's Clinical Goal: afebrile, AOx3 forgetful at times, pulse ox sating 92-96% on room air, VSS, c/o ABD, back, and bilateral eye pain. Tylenol x2 and oxycodone x2. Primafit in place, voiding. BMAT 2. Fentanyl patch Right upper arm. Bed alarm set, turning q2hr., G-tube 23ml/hr at goal rate. Call light within reach.

## 2021-05-16 NOTE — Consults
FINAL DISCHARGE MULTIDISCIPLINARY NOTE  Department of Care Coordination      Admit DTOI:[id number]  Anticipated Date of Discharge: 05/17/2021    Following KD:XIPJA, Lilia Argue., MD    Home Address:[address]  [place] [address]      DISCHARGE INFORMATION:     Discharge Address: [place] [address] [place], [place] [address]    Individual(s) notified of discharge plan:                                       [facility] Choice List: Provided to Pt/Family Date Provided: 05/14/21   Freedom of Choice: Educated and Provided       Final Discharge Needs: [place], Home Infusion Coordination                     Facility Transfer/Placement (if applicable):        Home Health Coordination (if applicable):   Agency Name: [facility] Person: intake  Phone Number(s): [phone]  Fax Number: [phone]  Start of Care Date: 05/16/21  Services: Physical Therapy, Nursing  Comments: Nursing for TF and PT    Home Infusion Coordination (if applicable):   Agency Name: [facility]  Contact Person: Sheran Spine  Phone Number(s): [phone]  Fax Number: [phone]  Start of Care Date: 05/16/21  Services: TF and supplies    Hospice (if applicable):        Home Durable Medical and/or Respiratory Equipment (if applicable):        Hemodialysis (if applicable):        Homeless Discharge (if applicable):   Primary Living Situation: Lives w/Family    Transportation Arrangements (if applicable):        Other Arrangements (if applicable):        Hale Drone,  05/16/2021

## 2021-05-16 NOTE — Discharge Summary
SOLID ONCOLOGY SERVICE DISCHARGE SUMMARY  ADMIT DATE: 05/10/2021  DISCHARGE DATE: 05/16/2021  ATTENDING PHYSICIAN: Norberto Sorenson., MD  NURSE PRACTITIONER: Troy Sine. Shona Needles, NP  PRIMARY ONCOLOGIST: Dr. Pollyann Kennedy  PRIMARY CARE PROVIDER: Roxanne Gates., MD  LOS: 6  ADMISSION CATEGORY: Other  ADMISSION DIAGNOSES: Weakness generalized [R53.1]  Failure to thrive in adult [R62.7]  DISCHARGE DIAGNOSES:   Patient Active Problem List   Diagnosis    CAD (coronary artery disease)    Benign essential HTN    Dyslipidemia    OA (osteoarthritis)    Depression    Lymphoma of cardia of stomach (HCC/RAF)    Cystitis    Weakness generalized    Failure to thrive in adult       Brief History of Present Illness:   See admission H&P for full details of admission.  Briefly, Erika Kline is a 87 y.o. female with recently diagnosed esophageal SCC, stomach lymphoma in remission (2005), PAF on apixaban, chronic diastolic dysfunction, nonrheumatic MR sp mitraclip, HTN/HLD, recent COVID19 infection who presents with failure to thrive.     Hospital Course:   On admission patient underwent restaging scans with CT chest showing slightly enlarged mid esophageal posterior eccentric mass with circumferential esophageal wall invasion and CT AP with no evidence of metastatic disease. Tumor markers were also checked (CEA=4.7 & CA19-9=28). She remained admitted pending home health arrangement. Overall plan for close follow up with Oncology (Dr. Pollyann Kennedy) and RadOnc (Dr. Carleene Cooper) after discharge. Will also refer to neurology on discharge for further workup of chronic cognitive decline.    # Esophageal SCC with G tube dependence: POA Recently diagnosed during hospitalization 11/4. Has not started any cancer directed therapies yet. Has staging imaging (CT C/A/P w contrast) from 11/07 at OSH. Established care with Dr. Pollyann Kennedy on 12/5. CT chest (12/11): slightly enlarged mid esophageal posterior eccentric mass with circumferential esophageal wall invasion, currently in total 19 x 19 mm (3-47)-previously 19 x 14 mm (4-30). CTAP (12/11): no evidence of metastatic disease. (12/10): CEA=4.7 & CA19-9=28  - Pathology slides requested from [facility] in [place] 05/15/21  - Radiation oncology consulted during this hosptitalization; plan for outpatient consult with Dr. Carleene Cooper  - Home palliative care ordered  - Follow up with Dr. Carleene Cooper requested  - Follow up with Dr. Pollyann Kennedy requested      # Failure to thrive: POA Likely related to recently diagnosed esophageal SCC with new G tube dependence after extended hospital stay, now with ongoing weight loss and largely bed bound. SW consulted for caregiver services.  - G-tube feeds per below  - DME FWW, shower chair ordered  - HH Nursing, PT/OT, HHA ordered for discharge    # Dysphagia, POA:  #G-tube dependent, POA:   #Severe Malnutrition, POA: Due to newly diagnosed esophageal SCC. GT compelted full workup with his aspiration risk and obstruction due to mass, symptoms. Nursing swallow study on admission indicated aspiration and unsafe PO intake. :  I have seen and examined the patient and agree with the RD assessment detailed below:  Patient meets criteria for: Severe Malnutrition    (current weight 45.6 kg (100 lb 8 oz), BMI (Calculated): 16.72; IBW: 56.7 kg (125 lb), % Ideal Body Weight: 77 %). See RD notes for additional details.  - Strict NPO  - FU Vitamin B1 as an outpatient  - Zinc (12/11): low 52.9, start zinc supplementation for 2 week course  - Coninue tube feeds per RD rec Fibersource HN continuous, tolerated goal feed  rate  - Transition to Fibersource HN 6 cartons of formula per day on discharge  - HH tube feeds ordered; patient will need duration greater than 90 days     # COVID19 infection: POA Per daughter, patient tested positive on 12/03 BinaxNOW Antigen during SNF outbreak. Initially with symptoms like congestion, mild cough, severe diarrhea, confusion, now improving. No respiratory symptoms currently or hypoxia noted. Symptomatically seems well controlled. COVID19 positive on admission 12/9. Given symptoms improving on admission, held off on anti-COVID therapies  per unofficial ID consult. Symptoms resolved. No oxygen use inpatient.   - COVID recovered per infection prevention     # Cancer related pain: POA Due to above              - Continue fentanyl 12 mcg patch Q72H on discharge              - Continue oxycodone 5 mg Q6H PRN severe pain on discharge              - Continue tylenol 650 mg Q6H PRN pain on discharge  - Continue glycopyrrolate 1 mg at bedtime for secretions on discharge     # GERD, POA: Home PPI.  - Continue pantoprazole 40mg  per GT BID on discharge  - Continue famotidine 20mg  per GT BID given persistent symptoms      # Dermatitis, nPOA: Local reaction. Suspect dermatitis to skin under eyes, nursing staff noted patient was using Sani-cloth chemical wipes to eye area. Denies vision changes or purulent drainage.  - Improving  - Encouraged patient not to touch eyes  - Calamine lotion to area under eye PRN  - Continue artificial tears ointment on discharge     # Normocytic anemia: nPOA suspect due to dilutional. hgb on admission 11.9, patient received 1L fluid bolus in ED.   - Hgb stable  - Transfuse for hgb <8  - Monitor for bleeding, pt has had scan hemoptysis prior to admission   - CBC daily     # Paroxysmal A fib: POA On frailty dosing apixaban outpatient. CHADSVASC 6 with hx of TIA.   - Continue apixaban 2.5mg  PO BID  On discharge  - Continue home metoprolol tartrate 12.5 mg BID, hold SBP < 100 or HR < 50     # HTN: POA home metoprolol as above      # NSVT beats /PVCs, nPOA: Pacemaker. Asymptomatic, less than 10 beats. Noted on 12/13. Stable with electrolyte repletion with no events >24 hours. Cardiac monitoring DC'ed.     # Cognitive decline: POA family reports patient becoming more forgetful since her diagnosis c/f onset of dementia and requesting inpatient work up. Patient is currently a/o x 4 and is able to answer questions regarding her recent hospitalization but forgets the details. Query component of delirium from prolonged hospitalization.   - Neurology referral on discharge     Significant Studies/Procedures:   CT chest w contrast    Result Date: 05/12/2021  IMPRESSION: Oncologic findings: Slightly enlarged mid esophageal posterior eccentric mass with circumferential esophageal wall invasion, currently in total 19 x 19 mm (3-47), previously 19 x 14 mm (4-30). Additional findings: Left ventricle AV pacer. Atherosclerotic calcification thoracic aorta, brachiocephalic, left subclavian, left anterior descending arteries. Generalized cardiomegaly. Increased trace to small bilateral pleural effusions. Unchanged calcified left interlobar lymph node and calcified and noncalcified subcentimeter nodules right upper lobe (3-39), left upper lobe (3-38), consistent with prior granulomatous disease. New patchy ground glass attenuation lingula, consistent with  developing pneumonia and/or aspiration. Unchanged postinflammatory pleural parenchymal scarring bilateral lower, right middle lobes, lingula. Increased bilateral lower lobe atelectasis. Summary: Recently diagnosed esophageal squamous cell carcinoma with slight radiographically increased primary mid esophageal mass; increased trace to small bilateral pleural effusions. Signed by: Kae Heller   05/12/2021 7:10 AM    CT abd+pelvis w contrast    Result Date: 05/12/2021  IMPRESSION: 1. History of esophageal cancer with no definite metastatic disease in the abdomen or pelvis. Single peri-centimeter gastrohepatic ligament lymph node, indeterminate. Attention on follow-up. 2. Moderate intrahepatic and extrahepatic biliary ductal dilatation and mild diffuse dilatation of the main pancreatic duct. Consider further evaluation with an MRCP on a nonemergent basis. I, Alexia Freestone, M.D., have reviewed this radiological study personally and I am in full agreement with the findings of the report presented here. Dictated by: Aletha Halim   05/12/2021 8:51 AM Signed by: Alexia Freestone   05/12/2021 9:10 AM    XR chest ap portable (1 view)    Result Date: 05/10/2021  IMPRESSION: Bilateral peripherally calcified breast implants partially obscure visualization of the lower lung fields, left greater than right. Faint right upper lobe opacities suspicious for an active infectious/inflammatory process. Right upper lobe micronodule nodule on prior CT difficult to identify with certainty. Small calcified granuloma left upper lobe. Left basilar atelectasis and/or scarring. Thoracic aorta tortuous with calcite plaque. Heart size is stable. Metal density cardiac implant is stable in positioning, suggesting a mitral. No acute fracture. Signed by: Ilda Mori   05/10/2021 6:11 PM    Consult:   RadOnc    Pending Studies/Evaluation Needing Follow-up  Last 24 Hour/Overnight Events:   None    Discharge Condition:   Stable.  ECOG Performance Status:  2-Ambulatory and capable of all selfcare but unable to carry out any work activities. Up and about more than 50% of waking hours    Discharge Physical Examination:   BP 109/60  ~ Pulse 67  ~ Temp 36.6 ?C (97.9 ?F) (Oral)  ~ Resp 18  ~ Ht 1.651 m (5' 5'')  ~ Wt 45.6 kg (100 lb 8 oz)  ~ SpO2 94%  ~ BMI 16.72 kg/m?      General: alert, chronically ill appearing, NAD  Head: Atraumatic, normocephalic, PERRL, EOMI, mmm, pharynx normal without lesions.  Neck: supple, no significant adenopathy.  Heart: RRR normal S1, S2,  Lungs: normal work of breathing,clear to auscultation with no wheezes, rales or rhonchi  Abdomen: soft, ttp around the g-tube, nondistended, normal bowel sounds. g-tube with mild surrounding erythema but no drainage  Extremities: warm and well perfused; no lower extremity edema   Skin: normal coloration and turgor, no rashes, no suspicious skin lesions noted.  Neuro: AOx2 (disoriented to time and full situation) forgetful, normal speech    Patient Instructions: Precautions: Please refer to AVS.  Activity: as tolerated  Diet: regular  Wound Care: none needed    Disposition:   Home or Self Care    Follow-up Appointments:   No future appointments.    Home Health Services/DME Set Up?  Yes      Pain Medications/Other Meds Delivered to room? Yes      Primary Oncology Appt within 1 week follow-up set up or requested?  Yes      LACE Risk Stratification:     LACE+ Risk Stratification    Total Score Risk Stratification   0-28 Minimal Risk   29-58 Moderate Risk   59-78 High Risk   79-90 Highest Risk  Readmission Score              68 Total Score    15 Urgent Admission    6 Length of Stay    47 Charlson Score (by age & number of urgent admissions)        Criteria that do not apply:    Female Patient    Discharge Institution    Alternative Level of Care Status    ED Visits in Previous 6 Months    Elective Admission in Previous Year              Outpatient Work-Up Needed:   Follow up with Oncology, Radiation Oncology and Neurology    Referrals:   []  Pallative Care  []  [facility]  []  Interventional Radiology for scheduled paracentesis or thoracentesis  []  Pain Management  []  Other:  [x]  None  Primary Oncologist Notified of Discharge Plan?  Yes    Discharge Medications:        Medication List        START taking these medications      apixaban 2.5 mg tablet  Commonly known as: Eliquis  Take 1 tablet (2.5 mg total) by mouth two (2) times daily.     artificial tears 83-15% ophthalmic ointment  Place into both eyes four (4) times daily.     calamine-zinc oxide lotion  Apply topically every six (6) hours as needed (Irritation to skin face).     famotidine 20 mg tablet  Commonly known as: Pepcid  1 tablet (20 mg total) by Per G Tube route two (2) times daily.     fentaNYL 12 mcg/hr patch  Commonly known as: Duragesic  Place 1 patch onto the skin every three (3) days. Max Daily Amount: 1 patch  Start taking on: May 17, 2021     glycopyrrolate 1 mg tablet  Commonly known as: Robinul  1 tablet (1 mg total) by Per G Tube route at bedtime.     metoprolol tartrate 25 mg tablet  Commonly known as: Lopressor  0.5 tablets (12.5 mg total) by Per G Tube route two (2) times daily.     ondansetron 8 mg tablet  Commonly known as: Zofran  1 tablet (8 mg total) by Per G Tube route every eight (8) hours as needed for Nausea.     oxyCODONE 5 mg/5 mL solution  5 mLs (5 mg total) by Per G Tube route every six (6) hours as needed. Max Daily Amount: 20 mg     pantoprazole 2 mg/mL oral suspension  Take 20 mLs (40 mg total) by mouth two (2) times daily.     zinc sulfate heptahydrate solution  12.3 mLs (50 mg of elemental zinc total) by Per G Tube route daily for 10 days.  Start taking on: May 17, 2021            CONTINUE taking these medications      atorvastatin 20 mg tablet  Commonly known as: Lipitor  1 tablet (20 mg total) by Per NG Tube route daily.     escitalopram 20 mg tablet  Commonly known as: Lexapro  Take 1 tablet (20 mg total) by mouth daily.     triamcinolone 0.1% cream  Commonly known as: Kenalog  Apply to itchy skin twice daily as needed. Avoid face, groin, armpits.            STOP taking these medications      aspirin 81 MG tablet  magnesium oxide 400 mg tablet     metoprolol succinate 25 mg 24 hr tablet  Commonly known as: Toprol-XL               Where to Get Your Medications        These medications were sent to [facility] ([facility]) [phone] [address], [place] [address]      Hours: Mon-Fri 8AM-6PM, Saturdays & Holidays 8AM-5PM (Closed 1PM-2PM for lunch); Closed Sundays Phone: [phone]   apixaban 2.5 mg tablet  artificial tears 83-15% ophthalmic ointment  famotidine 20 mg tablet  fentaNYL 12 mcg/hr patch  glycopyrrolate 1 mg tablet  metoprolol tartrate 25 mg tablet  ondansetron 8 mg tablet  oxyCODONE 5 mg/5 mL solution  pantoprazole 2 mg/mL oral suspension  zinc sulfate heptahydrate solution       Information about where to get these medications is not yet available    Ask your nurse or doctor about these medications  calamine-zinc oxide lotion         This patient was seen and discussed with attending physician Norberto Sorenson., MD, with whom the above assessment and plan was formulated.  05/16/2021 at 2:37 PM    Author: Troy Sine. [place], NP    I have seen and examined the patient with the nurse practitioner.  I reviewed the patient's subjective symptoms, the objective physical laboratory findings, procedure data and have developed a treatment plan with her.  I agree with the findings and the plan of care as documented in her note.

## 2021-05-16 NOTE — Telephone Encounter
Discharge Followup Appointment Info    Is the patient being discharged to SNF?: No   PCP Name: Dalia Heading., MD   Barriers to Scheduling: None   Did you schedule any Specialty visits?: Yes   Were there any scheduling issues?: No   Additional Appts/Tests/Info   Future Appointments  05/29/2021 1:00 PM    Gwynneth Albright., MD, MPH    MP2 [place]          RAD ONC [facility]  07/11/2021   12:00 PM   Bari Mantis., MD      [place] Message sent for sooner appointment  Hem Dr. Constance Holster message sent to [facility] schedulars awaiting response.  Spoke with patients daughter Confirmed appointments

## 2021-05-16 NOTE — Progress Notes
Physical Therapy Treatment      PATIENT: Erika Kline  MRN: [id number]    Treatment Date: 05/16/2021    Patient Presentation: Position: Up in chair;Family/CG present (Options HH RN)  Lines/devices Drains: HLIV;G/J/Peg tube (Clamped for PT)    Precautions   Precautions: Fall risk;Monitor Vitals;Check Labs (recovered COVID)  Orthotic: None  Current Activity Order: Order implies OOB  Weight Bearing Status: Not Applicable    Cognition   Cognition: Exceptions to WDL  Arousal/Alertness: Appropriate responses to stimuli  Attention Span: Attends with cues to redirect  Memory: Decreased recall of recent events  Orientation Level: Oriented to person;Oriented to place;Oriented to situation  Following Commands: Follows one step commands with increased time;Follows one step commands with repetition  Safety Awareness: Good awareness of safety precautions  Barriers to Learning: Physical Limitations    Functional Mobility   Sit to Stand: Moderate Assist  Ambulation: Minimum Assist  Ambulation Distance (Feet): march in place in the room, then total 120 feet in the hallway w/ 2 seated rest breaks, c/o dizziness, flexed posture, chair follow by carepartner  Gait Pattern: Unsteady;Decreased pace  Assistive Device: Front wheeled walker     Pain Assessment   Patient complains of pain: No    Patient Status   Activity Tolerance: Good  Oxygen Needs: Room Air  Response to Treatment: Dizzy;Vital signs changes (Comment)  Compliance with Precautions: Not Applicable  Call light in reach: Yes  Presentation post treatment: Up in chair;Family/CG present  Comments: BP in sitting in the chair: 100/52mmHg, standing: 80/64, in sitting after march in place: 80/23mmHg, in sitting after ambulation of 120 feet in the hallway: 104/72.  Carepartner Marcelino Duster) assisted w/ the chair follow for safety.  Educated daughter re: home safety such as chair follow w/ ambulating.  Left pt up in the chair w/ RN notified re: no chair alarm box available.    Interdisciplinary Communication   Interdisciplinary Communication: Nurse Angela Nevin)    Treatment Plan   Continue PT Treatment Plan with Focus on: Bed mobility training;Transfer training;Gait training;Stair training    PT Recommendations   Discharge Recommendation: Physical Therapy;Would benefit from continued therapy (HHPT if dc home)  Discharge concerns: Requires assistance for mobility;Requires assistance for self care  Discharge Equipment Recommended: Defer to discharge facility;Owns recommended DME    Treatment Completed by: Minda Meo, PTA

## 2021-05-16 NOTE — Progress Notes
Occupational Therapy Treatment    PATIENT: Erika Kline  MRN: [id number]    Treatment Date: 05/16/2021    Patient Presentation: Position: In bed;Bed alarm on;Family/CG present  Lines/devices Drains: HLIV;G/J/Peg tube;Cardiac Monitor;Pulse Ox  B/B Devices: External cath    Pertinent Updates: Recovered COVID, Isolation d/c'd    Precautions   Precautions: Fall risk;Monitor Vitals;Check Labs  Orthotic: None  Current Activity Order: Order implies OOB  Weight Bearing Status: Not Applicable    Cognition   Cognition: Exceptions to WDL or Baseline Status  Arousal/Alertness: Appropriate responses to stimuli  Attention Span: Attends with cues to redirect  Memory: Decreased recall of recent events  Orientation Level: Oriented to person;Oriented to place;Oriented to situation  Following Commands: Follows one step commands with repetition  Problem Solving: Assistance required to generate solutions  Sequencing: Intact  Initiation: Good  Safety Awareness: Good awareness of safety precautions  Barriers to Learning: Hard of Hearing    Bed Mobility   Supine to Sit: Contact Guard Assist  Sit to Supine: Not Performed (Pt remained up in chair)    Functional Transfers   Sit to Stand: Contact Guard Assist  Transfer: From;Bed;To;Chair  Level of Assist: Contact Guard Assist  Type of Transfer: Stand step;Front wheeled walker  Functional Mobility: Contact Guard Assist;Front wheeled walker (Pt walked around bed x2)    Activities of Daily Living (ADLs)   Grooming: Performed  Grooming Assistance: Setup  Grooming Deficit: Catering manager: None  Grooming Where Assessed: Chair  UB Dressing: Performed  UB Dressing Assistance: Minimum Assist  UB Dressing Deficit: Verbal cueing;Pull around back  UB Dressing Adaptive Equipment: None  UB Dressing Where Assessed: Chair    Balance   Sitting - Static: Good  Sitting - Dynamic: Good            Pain Assessment   Patient complains of pain: No    Patient Status   Activity Tolerance: Good  Oxygen Needs: Room Air  Response to Treatment: Nausea;Vital signs changes (Comment);Nursing notified (BP 125/70's prior to tx, 106/70's at session end- Pt asymptomatic throughout)  Compliance with Precautions: Not Applicable  Call light in reach: Yes  Presentation post treatment: Up in chair;Lines/drains intact;Family/CG present;w/RN  Comments: Improved activity tolerance today noted for OOB activity despite continued c/o nausea.  Pt's daughter present throughout, reviewed incorportion of energy conservation techniques during ADL's and use of DME including BSC, shower chair to insure safety    Interdisciplinary Communication   Interdisciplinary Communication: Occupational Therapist;Nurse    Treatment Plan   Continue OT Treatment Plan with Focus on: ADL training;Functional mobility training;Patient/family/caregiver education and training;Energy conservation;Assistive device training    OT Recommendations   Discharge Recommendation: Would benefit from continued therapy  Discharge concerns: Requires assistance for self care;Requires assistance for mobility  Discharge Equipment Recommended: Wheelchair;Walker;Commode;Paediatric nurse;Owns recommended DME (pt's daughter reports pt owns shower stool and  w/c)  Equipment ordered: MD aware of need to place order    Treatment Completed by: Sula Soda. Felisha Claytor, COTA

## 2021-05-17 LAB — Glucose,POC: GLUCOSE,POC: 108 mg/dL — ABNORMAL HIGH (ref 65–99)

## 2021-05-17 LAB — Vitamin B1 (Thiamine),Wh Blood: VITAMIN B1 (THIAMINE),WH BLOOD: 115 nmol/L (ref 70–180)

## 2021-05-17 MED ADMIN — OXYCODONE HCL 5 MG/5ML PO SOLN: 5 mg | GASTROSTOMY | @ 02:00:00 | Stop: 2021-05-17 | NDC 00121482705

## 2021-05-17 NOTE — Telephone Encounter
Discharge Followup Appointment Info    Is the patient being discharged to SNF?: No   PCP Name: Dalia Heading., MD   Barriers to Scheduling: None   Did you schedule any Specialty visits?: Yes   Were there any scheduling issues?: No   Additional Appts/Tests/Info   Future Appointments  05/29/2021 1:00 PM    Gwynneth Albright., MD, MPH    [facility]2 [place]          RAD ONC [facility]  07/11/2021   12:00 PM   Bari Mantis., MD      [place] Message sent for sooner appointment  Hem Dr. Constance Holster message sent to [facility] schedulars awaiting response.  Spoke with patients daughter Confirmed appointments  Future Appointments  06/06/2020 Dr. Eli Phillips awaiting time from office  06/07/2021   1:00 PM    Sim Boast, MD            [place] [facility]        NEUROLOGY   Spoke with daughter Myriam Jacobson confirmed   Update:  06/06/2020 1:45 Dr. Eli Phillips

## 2021-05-17 NOTE — Other
Patient's Clinical Goal: Pain management, Comfort, Rest  Clinical Goal(s) for the Shift: Safety, Stable VS, Pain management, Comfort, Rest  Identify possible barriers to advancing the care plan: none  Stability of the patient: Moderately Stable - low risk of patient condition declining or worsening   Progression of Patient's Clinical Goal: Pt remained A/Ox3-4 throughout shift.  VSS on RA.  Pain managed well controlled with PRNs.  G tube with continuous feeding throughout the shift. D/C instructions given to pt. Belongings accounted for. PIV removed. Reviewed medications and s/s of adverse effects. Medications delivered to bedside and given to daughter. Home health services set up. Reviewed s/s of infection and other reasons to notify MD. F/U appointments given. Pt and the patients daughter verbalized understanding. Discharged accompanied by DC lounge.

## 2021-05-17 NOTE — Progress Notes
Pharmaceutical Services - Meds to [facility] Discharge Medication Reconciliation and Counseling Note    Patient Name: Erika Kline  Medical Record Number: [id number]  Admit date: 05/10/2021 7:39 PM    Age: 87 y.o.  Sex: female  Allergies: Amlodipine besylate and Clarithromycin    Preferred Pharmacy:   [facility] [address], [place] - [address] [facility] [address]  [address] [place] [address]         I reconciled the discharge medications and counseled the patient on all new prescriptions. Discharge prescriptions were delivered to bedside. The purpose, potential side effects, storage, missed doses and any special instructions related to each medication was discussed. Medications continued from home were also reviewed with the patient.    Discharge Medication List from AVS:     Changes To My Medications        START taking these medications        Dose Instructions   apixaban 2.5 mg tablet  Commonly known as: Eliquis   2.5 mg   Take 1 tablet (2.5 mg total) by mouth two (2) times daily.     calamine-zinc oxide lotion    Apply topically every six (6) hours as needed (Irritation to skin face).     famotidine 20 mg tablet  Commonly known as: Pepcid   20 mg   Give 1 tablet (20 mg total) by Per G Tube route two (2) times daily.     fentaNYL 12 mcg/hr patch  Commonly known as: Duragesic  Start taking on: May 17, 2021   1 patch   Place 1 patch onto the skin every three (3) days. Max Daily Amount: 1 patch     glycopyrrolate 1 mg tablet  Commonly known as: Robinul   1 mg   Give 1 tablet (1 mg total) by Per G Tube route at bedtime.     metoprolol tartrate 25 mg tablet  Commonly known as: Lopressor   12.5 mg   Give one-half tablets (12.5 mg total) by Per G Tube route two (2) times daily.     ondansetron 8 mg tablet  Commonly known as: Zofran   8 mg   Give 1 tablet (8 mg total) by Per G Tube route every eight (8) hours as needed for Nausea.     oxyCODONE 5 mg/5 mL solution   5 mg   Give 5 mLs (5 mg total) by Per G Tube route every six (6) hours as needed. Max Daily Amount: 20 mg     pantoprazole 2 mg/mL oral suspension   40 mg   Take 20 mLs (40 mg total) by mouth two (2) times daily.     REFRESH LACRI-LUBE ophthalmic ointment    Place into both eyes four (4) times daily.     zinc sulfate heptahydrate solution  Start taking on: May 17, 2021   50 mg of elemental zinc   Give 12.3 mLs (50 mg of elemental zinc total) by Per G Tube route daily for 10 days.            CONTINUE taking these medications        Dose Instructions   atorvastatin 20 mg tablet  Commonly known as: Lipitor   20 mg   1 tablet (20 mg total) by Per NG Tube route daily.     escitalopram 20 mg tablet  Commonly known as: Lexapro   20 mg   Take 1 tablet (20  mg total) by mouth daily.     triamcinolone 0.1% cream  Commonly known as: Kenalog    Apply to itchy skin twice daily as needed. Avoid face, groin, armpits.            STOP taking these medications        STOP taking these medications   aspirin 81 MG tablet      magnesium oxide 400 mg tablet      metoprolol succinate 25 mg 24 hr tablet  Commonly known as: Toprol-XL                Prescriptions        These medications were sent to [facility] ([facility] [phone] [address], [place] [address]      Hours: Mon-Fri 8AM-6PM, Saturdays & Holidays 8AM-5PM (Closed 1PM-2PM for lunch); Closed Sundays Phone: [phone]   apixaban 2.5 mg tablet  famotidine 20 mg tablet  fentaNYL 12 mcg/hr patch  glycopyrrolate 1 mg tablet  metoprolol tartrate 25 mg tablet  ondansetron 8 mg tablet  oxyCODONE 5 mg/5 mL solution  pantoprazole 2 mg/mL oral suspension  REFRESH LACRI-LUBE ophthalmic ointment  zinc sulfate heptahydrate solution       Information about where to get these medications is not yet available    Ask your nurse or doctor about these medications  calamine-zinc oxide lotion       Daughter stated she has eliquis at home  Zinc to be ordered PTL for Monday    Allene Pyo, PharmD, 05/16/2021, 5:39 PM

## 2021-05-17 NOTE — Nursing Note
Discharge Team Note    Requested by: primary nurse     Needs for Discharge:    Review AVS [x]     Remove PIV [x]     Escort []     Pick up home medications at pharmacy []     Assist with UBER service []      Other []      Interventions:    Reviewed AVS with patient and/or family members, answered all questions and concerns of the discharge planning [x]     Removed patient's PIV  (PIV x 1 [x]  ,  PIV x 2 [] , Other []  )    Picked up home medications at pharmacy []      Escorted patient out of the unit [x]     Assisted with UBER service []      Disposition:    Patient was escorted to Discharge Lounge to wait for the ride []      Patient was escorted to [facility] front entrance [x]     Patient was escorted to [facility] Emergency Department entrance []     Patient was discharged home accompanied by:    Spouse []      Family member [x]      Friend []      Significant Other [] 

## 2021-05-17 NOTE — Telephone Encounter
Discharge Followup Appointment Info    Is the patient being discharged to SNF?: No   PCP Name: Dalia Heading., MD   Barriers to Scheduling: None   Did you schedule any Specialty visits?: Yes   Were there any scheduling issues?: No   Additional Appts/Tests/Info   Future Appointments  05/29/2021 1:00 PM    Gwynneth Albright., MD, MPH    [facility] [place]          RAD ONC [facility]  07/11/2021   12:00 PM   Bari Mantis., MD      [place] Message sent for sooner appointment  Hem Dr. Constance Holster message sent to [facility] schedulars awaiting response.  Spoke with patients daughter Confirmed appointments  Future Appointments  06/06/2020 Dr. Eli Phillips awaiting time from office  06/07/2021   1:00 PM    Sim Boast, MD            [place] [facility]        NEUROLOGY   Spoke with daughter Myriam Jacobson confirmed

## 2021-05-20 ENCOUNTER — Telehealth: Payer: MEDICARE

## 2021-05-20 ENCOUNTER — Non-Acute Institutional Stay: Payer: MEDICARE

## 2021-05-20 DIAGNOSIS — C159 Malignant neoplasm of esophagus, unspecified: Secondary | ICD-10-CM

## 2021-05-20 MED ORDER — ONDANSETRON HCL 8 MG PO TABS
8 mg | ORAL_TABLET | Freq: Four times a day (QID) | GASTROSTOMY | 0 refills | Status: AC | PRN
Start: 2021-05-20 — End: 2021-05-24

## 2021-05-20 NOTE — Telephone Encounter
Call Back Request      Reason for call back: The patients daughter is callign to follow up with Dr. Constance Holster about the recommendation/referral for a pcp for the patient, preferably in [place].    Any Symptoms:  []  Yes  [x]  No       If yes, what symptoms are you experiencing:    o Duration of symptoms (how long):    o Have you taken medication for symptoms (OTC or Rx):      If call was taken outside of clinic hours:    [] Patient or caller has been notified that this message was sent outside of normal clinic hours.     [] Patient or caller has been warm transferred to the physician's answering service. If applicable, patient or caller informed to please call us back if symptoms progress.  Patient or caller has been notified of the turnaround time of 1-2 business day(s).  CBN: [phone]    Thank You

## 2021-05-22 ENCOUNTER — Non-Acute Institutional Stay: Payer: MEDICARE

## 2021-05-22 ENCOUNTER — Telehealth: Payer: MEDICARE

## 2021-05-22 DIAGNOSIS — C159 Malignant neoplasm of esophagus, unspecified: Secondary | ICD-10-CM

## 2021-05-22 NOTE — Telephone Encounter
Home Palliative Care Referral    Referral faxed to   [facility] Room  Phone: [phone]  Fax: [phone]      Thank You,    [place]    [phone]

## 2021-05-22 NOTE — Progress Notes
Occupational Therapy  Discharge Summary    PATIENT: Erika Kline  MRN: [id number]  DOB: 11/17/1933      Date:  05/21/2021   Therapist: Mickle Mallory, OT     Patient has been seen for:  ADL training;Patient and/or family education;Discharge planning;Training on use of assistive devices;Functional balance activities;Functional transfer training    Objective     See Daily Progress Notes for functional levels     Patient showing progress in: ADL training;Functional transfer training;Functional balance activities    Assessment     Goals met: No    Reason Goal(s) Not Met: Decreased endurance;Weakness    Goals:  Short Term Goals to be achieved in: 7 days  Pt will groom self: with stand by assist, standing  Pt will toilet self: with stand by assist  Pt will dress upper body: with stand by assist, sitting edge of bed, sitting in chair  Pt will dress lower body: with stand by assist, sitting edge of bed, sitting in chair, with verbal cues  Pt will perform: to/from toilet, to/from chair, with stand by assist (defer AD to PT if needed)    Continue present treatment plan: No      Updated Discharge Recommendations:  Discharge Recommendation: Would benefit from continued therapy  Discharge concerns: Requires assistance for self care;Requires assistance for mobility  Discharge Equipment Recommended: Wheelchair;Walker;Commode;Civil engineer, contracting;Owns recommended DME  Equipment ordered: MD aware of need to place order

## 2021-05-23 ENCOUNTER — Non-Acute Institutional Stay: Payer: MEDICARE

## 2021-05-23 MED ORDER — ONDANSETRON HCL 4 MG/5ML PO SOLN
8 mg | Freq: Three times a day (TID) | ORAL | 0 refills | Status: AC | PRN
Start: 2021-05-23 — End: 2021-05-24

## 2021-05-23 MED ORDER — ONDANSETRON HCL 4 MG/5ML PO SOLN
4 mg | Freq: Three times a day (TID) | ORAL | 0 refills | Status: AC | PRN
Start: 2021-05-23 — End: 2021-06-06

## 2021-05-24 MED FILL — PANTOPRAZOLE SODIUM 40 MG PO TBEC: ORAL | 20 days supply | Qty: 800 | Fill #1

## 2021-05-28 ENCOUNTER — Telehealth: Payer: MEDICARE

## 2021-05-28 ENCOUNTER — Non-Acute Institutional Stay: Payer: MEDICARE | Attending: Student in an Organized Health Care Education/Training Program

## 2021-05-28 NOTE — Telephone Encounter
Called patient x2; went to voicemail. Left message for patient to return call regarding intake questionaire in preparation for tomorrow's appointment.

## 2021-05-28 NOTE — H&P
PATIENT:  Erika Kline   MRN:  [id number]  DOB:  [birth year]  DATE OF SERVICE: 05/28/2021     PRIMARY CARE PROVIDER: No primary care provider on file.      Subjective:     Chief Complaint: No chief complaint on file.   History of Present Illness: Erika Kline is a a 87 y.o. female with history of *** here for an Annual Wellness Exam and a comprehensive review of chronic medical conditions.    Patient reports ***    Current exercise habits - {exercise activities:35648}    Diet- Tries to eat well balanced meals ***    Hearing difficulties - {YES NO:23722::''Yes''}    Falls in the past year- {YES NO:23722::''Yes''}  Fear of falling- {YES NO:23722::''Yes''}  Balance - good  Use of assistive device-  {YES NO:23722::''Yes''}    Depression- {YES NO:23722::''Yes''}    PHQ-9 total score:   PH-9 TOTAL SCORE [place] 10/04/2015   Total Score 0        GAD-7 total score:  No flowsheet data found.    Memory - good    Safe in current home environment - Yes    Patient Active Problem List   Diagnosis   ? CAD (coronary artery disease)   ? Benign essential HTN   ? Dyslipidemia   ? OA (osteoarthritis)   ? Depression   ? Lymphoma of cardia of stomach (HCC/RAF)   ? Cystitis   ? Weakness generalized   ? Failure to thrive in adult       Past Medical History:   Diagnosis Date   ? Allergy, unspecified not elsewhere classified    ? Arthritis    ? Basal cell carcinoma    ? Cancer (HCC/RAF)     Stomach Cancer S/P chemo   ? Depression    ? Gastric lymphoma (HCC/RAF)    ? Gastric lymphoma (HCC/RAF)    ? Gastric lymphoma (HCC/RAF)    ? Hyperlipidemia    ? Hypertension        Past Surgical History:   Procedure Laterality Date   ? breast implants  1978   ? BREAST SURGERY         Allergies: Amlodipine besylate and Clarithromycin    Outpatient Medications Prior to Visit   Medication Sig   ? apixaban 2.5 mg tablet Take 1 tablet (2.5 mg total) by mouth two (2) times daily.   ? atorvastatin 20 mg tablet 1 tablet (20 mg total) by Per NG Tube route daily.   ? calamine-zinc oxide lotion Apply topically every six (6) hours as needed (Irritation to skin face).   ? escitalopram 20 mg tablet Take 1 tablet (20 mg total) by mouth daily.   ? famotidine 20 mg tablet Give 1 tablet (20 mg total) by Per G Tube route two (2) times daily.   ? fentaNYL 12 mcg/hr patch Place 1 patch onto the skin every three (3) days. Max Daily Amount: 1 patch   ? glycopyrrolate (ROBINUL) 1 mg tablet Give 1 tablet (1 mg total) by Per G Tube route at bedtime.   ? metoprolol tartrate 25 mg tablet Give one-half tablets (12.5 mg total) by Per G Tube route two (2) times daily.   ? ondansetron 4 mg/37mL solution Take 5 mLs (4 mg total) by mouth every eight (8) hours as needed for Nausea or Vomiting.   ? oxyCODONE 5 mg/5 mL solution Give 5 mLs (5 mg total) by Per G Tube route every six (6) hours  as needed. Max Daily Amount: 20 mg   ? pantoprazole 2 mg/mL oral suspension Take 20 mLs (40 mg total) by mouth two (2) times daily.   ? triamcinolone 0.1% cream Apply to itchy skin twice daily as needed. Avoid face, groin, armpits.   ? White Petrolatum-Mineral Oil (ARTIFICIAL TEARS) 83-15% ophthalmic ointment Place into both eyes four (4) times daily.   ? zinc sulfate heptahydrate solution Give 12.3 mLs (50 mg of elemental zinc total) by Per G Tube route daily for 10 days.     No facility-administered medications prior to visit.       Social History     Tobacco Use   ? Smoking status: Never   Vaping Use   ? Vaping Use: Never used   Substance Use Topics   ? Alcohol use: No   ? Drug use: No       Occupational History   ? Not on file       Family History   Problem Relation Age of Onset   ? Heart attack Father    ? Cancer Sister        Immunization History   Administered Date(s) Administered   ? COVID-19, mRNA, (Moderna) 100 mcg/0.5 mL 07/02/2019   ? Tdap 04/17/2009   ? influenza vaccine IM trivalent high dose (Fluzone High Dose) (PF) SYR (57 years of age and older) 06/07/2015   ? influenza, unspecified formulation 04/17/2009, 03/04/2011 ? pneumococcal conjugate vaccine 13-valent (Prevnar) 06/07/2015   ? pneumococcal polysaccharide vaccine 23-valent (Pneumovax) 03/05/2005       Health Maintenance   Topic Date Due   ? Hepatitis B Screening  Never done   ? Hepatitis C Screening  Never done   ? Shingles (Shingrix) Vaccine (1 of 2) Never done   ? Annual Preventive Wellness Visit  Never done   ? Advance Directive  Never done   ? Tdap/Td Vaccine (2 - Td or Tdap) 04/18/2019   ? Influenza Vaccine (1) 01/31/2021   ? COVID-19 Vaccine(Tracks primary and booster doses, not sup/immunocomp) (5 - Booster for Moderna series) 03/08/2021   ? Pneumococcal Vaccine  Completed   ? Osteoporosis Early Detection DEXA Scan  Completed   ? Statin prescribed for ASCVD Prevention or Treatment  Completed       Review of Systems:  No fevers, chills.  No vision or hearing changes.  No CP, palpitations, PND, orthopnea, edema.  No SOB, cough.  No nausea, vomiting, diarrhea, constipation.  No urinary symptoms.  No diabetes or thyroid conditions.  No HA or focal neurological deficits.  No depression or anxiety.    Advanced Care Planning:  This patient has an advanced directive:  {YES NO:23722::''Yes''}  This patient's designated decision maker if or when patient lacks capacity is:  {designated decision WJXBJ:47829}  POLST Form given to patient for review    Objective:     PHYSICAL EXAM:    There were no vitals taken for this visit.  Wt Readings from Last 3 Encounters:   05/12/21 100 lb 8 oz (45.6 kg)   10/04/15 107 lb 6.4 oz (48.7 kg)   08/28/15 111 lb (50.3 kg)      BP Readings from Last 3 Encounters:   05/16/21 103/58   10/04/15 110/70   08/28/15 118/79        General: NAD, pleasant, A,A,O X 3, appears stated age, cooperative  Head:  Normocephalic, without obvious abnormality, atraumatic  Eyes:  conjunctivae/corneas clear. EOMI, PERRL.  Ears:  normal TM's and  external ear canals both ears  Nose: Nares normal. Septum midline. Mucosa normal.  Throat:  lips, mucosa, and tongue normal; teeth and gums normal  Mouth:  No perioral or gingival cyanosis or lesions.  Tongue is normal in appearance.  Neck:  no adenopathy, supple, symmetrical, trachea midline and thyroid not enlarged, symmetric, no tenderness/mass/nodules palpated  Lungs:  clear to auscultation bilaterally  Heart:  regular rate and rhythm, S1, S2 normal, no murmurs, clicks, rubs or gallops  Abdomen: soft, non-tender, non-distended; bowel sounds normal; no masses, no organomegaly  Musculoskeletal:  negative   Extremities:  extremities normal, atraumatic, no clubbing, cyanosis or edema, pulses 2+  Neurologic: CNII-XII grossly intact, cerebellar function intact, reflexes 2+ bilateral upper / lower extremities  Skin:  Skin color, texture, turgor normal. No rashes or lesions      LABS:  Reviewed today  Lab Results   Component Value Date    WBC 8.69 05/16/2021    HGB 10.4 (L) 05/16/2021    HCT 32.4 (L) 05/16/2021    MCV 97.3 05/16/2021    PLT 293 05/16/2021     Lab Results   Component Value Date    CREAT 0.66 05/16/2021    BUN 22 05/16/2021    NA 134 (L) 05/16/2021    K 4.0 05/16/2021    CL 99 05/16/2021    CO2 29 05/16/2021     Lab Results   Component Value Date    ALT 10 05/16/2021    AST 23 05/16/2021    ALKPHOS 119 (H) 05/16/2021    BILITOT 0.4 05/16/2021     Lab Results   Component Value Date    CHOL 189 06/07/2015    CHOLDLCAL 93 06/07/2015    TRIGLY 50 06/07/2015     Lab Results   Component Value Date    TSH 2.9 06/07/2015     Lab Results   Component Value Date    HGBA1C 5.3 06/07/2015       Imaging Studies:   ***  No results found.         Health Maintenance   Topic Date Due   ? Hepatitis B Screening  Never done   ? Hepatitis C Screening  Never done   ? Shingles (Shingrix) Vaccine (1 of 2) Never done   ? Annual Preventive Wellness Visit  Never done   ? Advance Directive  Never done   ? Tdap/Td Vaccine (2 - Td or Tdap) 04/18/2019   ? Influenza Vaccine (1) 01/31/2021   ? COVID-19 Vaccine(Tracks primary and booster doses, not sup/immunocomp) (5 - Booster for Moderna series) 03/08/2021   ? Pneumococcal Vaccine  Completed   ? Osteoporosis Early Detection DEXA Scan  Completed   ? Statin prescribed for ASCVD Prevention or Treatment  Completed        Evaluation of Cognitive Function  Mood/affect: Oriented to time, place, person.   Judgement: intact    Functional Ability/Safety Screening:   Was the patient's timed UP&GO test unsteady or longer than 30 seconds? ***  Results of hearing evaluation: Able to hear whisper or finger-rub at 6 feet? ***    Review of opioid use: {Opioid:31023}    I have:   [x]  Reviewed/ordered []  1 []  2 [x]  ? 3 unique laboratory, radiology, and/or diagnostic tests noted above    [x]  Reviewed []  1 [x]  2 []  ? 3 prior external notes and incorporated into patient assessment    []  Discussed management or test interpretation with external provider(s) as noted  Assessment & Plan:   ***  There are no diagnoses linked to this encounter.     Advance Care Planning:  In addition to E/M services, I spent *** 16 minutes discussing advance care planning with the patient and/or her medical surrogate decision makers. Topics of conversation included: {Blank multiple:19196::''goals of care'',''treatment preferences''}.    Counseling performed:  I counseled the patient about the following:     Depression screening: *** minutes [12 or more needed to bill]     Cardiovascular disease:  *** minutes [12 or more needed to bill]     Obesity: *** minutes [12 or more needed to bill]     Tobacco cessation: *** minutes [12 or more needed to bill]     Alcohol misuse: *** minutes [8 or more needed to bill]     Sexually transmitted disease prevention: *** minutes [16 or more needed to bill]  The above plan of care, diagnosis, order, and follow-up were discussed with the patient. Questions related to this recommended plan of care were answered.  The additional diagnoses assessed at this visit constituted a separate, identifiable service from the routine preventive health examination.    There are no Patient Instructions on file for this visit.  Future Appointments   Date Time Provider Department Center   05/28/2021  1:00 PM Carmin Richmond., MD [facility]IC[facility]   05/29/2021  1:00 PM Aura Fey., MD, MPH [facility]2 [facility] RAD ONC [facility]   06/05/2021  4:00 PM Evaristo Bury., MD [facility]   06/06/2021  3:15 PM Roxanne Gates., MD HEM/ONC 600 MEDICINE   06/07/2021  1:00 PM Gayland Curry, MD [facility] NEUROLOGY     Armanda Magic, MD, MPH  [facility] Medicine  Health Science Clinical Instructor  Department of Medicine, [facility]  [address], [address]  [place], [place] [address]  05/28/2021 12:40 PM

## 2021-05-28 NOTE — Telephone Encounter
UPDATE    Per Cassandra at [facility],    ''Spoke with pt.'s daughter and she will call back after the holidays to schedule.''

## 2021-05-28 NOTE — Telephone Encounter
Call Back Request      Reason for call back: Pt's appt was scheduled on 1/4 @4pm  under daughter [name]'s name. [name] rescheduled the appt for 1/11 but it needs to be 1/4@4pm  for [name].    Please call Santiago Glad back to confirm the appt has be corrected.    [phone]    Any Symptoms:  []  Yes  [x]  No       If yes, what symptoms are you experiencing:    o Duration of symptoms (how long):    o Have you taken medication for symptoms (OTC or Rx):      If call was taken outside of clinic hours:    [] Patient or caller has been notified that this message was sent outside of normal clinic hours.     [] Patient or caller has been warm transferred to the physician's answering service. If applicable, patient or caller informed to please call us back if symptoms progress.  Patient or caller has been notified of the turnaround time of 1-2 business day(s).

## 2021-05-28 NOTE — Consults
RADIATION ONCOLOGY ESOPHAGUS NEW PATIENT VISIT NOTE    ?PATIENT: Erika Kline  ?MRN: [id number]  ?DOB: 1934/02/07    ?DATE OF SERVICE: 05/29/2021    ?REFERRING PRACTITIONER: Roxanne Gates., MD  ?PRIMARY CARE PROVIDER: No primary care provider on file.  ?RESIDENT PHYSICIAN: Ricky R. Cloretta Ned, MD   ?ATTENDING PHYSICIAN: Aura Fey, MD, MPH     ?CHIEF COMPLAINT: esophageal SCC    ?IDENTIFYING DATA: Erika Kline is a 87 y.o. female with PMH of gastric lymphoma in remission since 2005, paroxysmal atrial fibrillation on Apixaban, non-rheumatic MR s/p mitraclip, with a newly diagnosed esophageal Squamous cell carcinoma.     ?STAGE:  Cancer Staging   No matching staging information was found for the patient.     ?DATA OF INTERVAL HISTORY:    ? Esophagus Data Fields ~ Data  ~ Comments    ? Case type ~ Palliative ~     ? Location (epicenter) ~ Mid 1/3 ~     ? Biopsy date (m/d/yyyy) ~ 04/2021 ~     ? Histology ~ Squamous CC ~     ? Grade ~ Intermediate ~     ? Surgery - Primary Site ~ No ~     ? Surgery - Primary Site - Date (m/d/yyyy) ~  ~  ?   ~    Subjective:       History of Present Illness: Erika Kline's oncologic history and presentation is summarized as follows:    03/07/2021: CT chest (done due to SOB/desat with mild exertion): small pulm nodules, indeterminate  ?  11/4-25/2022: Admitted [place] for nausea/vomiting/weight loss/dysphagia.  Hospitalization. CT CAP: periph enhancing lesion in mid esoph (1.8cm), unchanged pulm nodules, no findings in the abd/pelvis.  EGD mass at 25 cm, partially obstructing.  Path: mod diff squamous cell carcinoma (no molec workup done).  PEG tube placed.  Few days later, new 3.5 cm fluid collection at g tube insertion site---aspirated/treated with antibiotics.  Sent to SNF for recovery.    05/06/2021: Initial consult with Dr. Pollyann Kennedy (medical oncology).     Pt last seen by medical oncology several years ago when she had a gastric lymphoma.  After extensive follow up, she was seen only by her primary care doctors.  Recently, moved to [place] to be closer to her son.  Daughters became aware she wasn't doing well and went to visit.  They saw she was extremely thin, with severe nausea/vomiting/not able to keep things down.  She was admitted to a local hospital where workup revealed SCC of the esophagus.  Allegedly, disease not metastatic but there was a question of it being locally advanced.  She had a PEG tube placed since she couldn't keep anything down by mouth. Because of extremely poor KPS, doctors did not think she could withstand therapy and only had a short time to live.     Unfortunately, had an abscess in site of G tube placement. When stable was discharged to SNF to complete antibiotics. And a few days ago, was found to be COVID + (there apparently was an outbreak in the SNF). Pt tested positive but apparently isn't ill.  Daughter that went up to visit there is also COVID + and stuck in her hotel room.    05/10/2021 - 05/16/2021: Admitted to [facility] for FTT    05/12/2021: CT CAP restaging    IMPRESSION:  ?  Oncologic findings:   Slightly enlarged mid esophageal posterior eccentric mass with circumferential esophageal wall invasion, currently in  total 19 x 19 mm (3-47), previously 19 x 14 mm (4-30).  ?  Additional findings:  Left ventricle AV pacer.  ?  Atherosclerotic calcification thoracic aorta, brachiocephalic, left subclavian, left anterior descending arteries. Generalized cardiomegaly.  ?  Increased trace to small bilateral pleural effusions.  ?  Unchanged calcified left interlobar lymph node and calcified and noncalcified subcentimeter nodules right upper lobe (3-39), left upper lobe (3-38), consistent with prior granulomatous disease.  ?  New patchy ground glass attenuation lingula, consistent with developing pneumonia and/or aspiration.   ?  Unchanged postinflammatory pleural parenchymal scarring bilateral lower, right middle lobes, lingula.   ?  Increased bilateral lower lobe atelectasis.  ?  Summary:  ?  Recently diagnosed esophageal squamous cell carcinoma with slight radiographically increased primary mid esophageal mass; increased trace to small bilateral pleural effusions.    IMPRESSION:  ?  1. History of esophageal cancer with no definite metastatic disease in the abdomen or pelvis. Single peri-centimeter gastrohepatic ligament lymph node, indeterminate. Attention on follow-up.  2. Moderate intrahepatic and extrahepatic biliary ductal dilatation and mild diffuse dilatation of the main pancreatic duct. Consider further evaluation with an MRCP on a nonemergent basis.        05/13/2021: Consulted Rad Onc while inpatient - could offer palliative 10 Fx vs. Definitive 25-30 Fx depending on goals of care. Outpatient follow up requested.     05/21/2021: [place] review of pathology slides    FINAL DIAGNOSIS    ?  ESOPHAGUS, MASS (BIOPSY) [OSR# [id number]; 04/08/2021]:  - Squamous cell carcinoma, moderately differentiated  - Immunostains: See report below   MICROSCOPIC DESCRIPTION    Microscopic examination has been performed.  ?  Dr. Leonarda Salon has reviewed this case and concurs.   GROSS DESCRIPTION    Received from [facility] via [facility], Pollyann Kennedy, Marigene Ehlers., MD are four stained slides labeled, [facility], [place] [facility]'' [phone] A1, A1 PAS-D 1, P40 and QC P40 11.9.22.  ?  KPT   IHC REPORT    GENERAL IMMUNOHISTOCHEMISTRY REPORT  (performed at outside institution and reviewed at [facility])  ?  OUTSIDE BLOCK:      A1  ?  ANTIBODY/PROBE: RESULT/COMMENT   p40 Positive   ?  INTERPRETATION: Supports the diagnosis.     Home palliative care ordered upon discharge.     Today, she feels overall quite nauseous. She dry heaves frequently. She has been using continuous tube feeds now. Pain is well controlled. Presents with two daughters.     Current Toxicity  Fatigue: Grade 2: Fatigue not relieved by rest; limiting instrumental ADL  Nausea: Grade 3: Inadequate oral caloric or fluid intake; tube feeding, TPN, or hospitalization indicated  Pain: Grade 1: Mild pain  Vomiting: Grade 1: Intervention not indicated  Dysphagia: Grade 4: Life-threatening consequences; urgent intervention indicated      Review of Systems: Negative except as otherwise noted in the HPI.    Current Outpatient Medications   Medication Sig   ? apixaban 2.5 mg tablet Take 1 tablet (2.5 mg total) by mouth two (2) times daily.   ? atorvastatin 20 mg tablet 1 tablet (20 mg total) by Per NG Tube route daily.   ? calamine-zinc oxide lotion Apply topically every six (6) hours as needed (Irritation to skin face).   ? escitalopram 20 mg tablet Take 1 tablet (20 mg total) by mouth daily.   ? famotidine 20 mg tablet Give 1 tablet (20 mg total) by Per G Tube route two (  2) times daily.   ? famotidine 40 mg/5 mL suspension Take 5 mLs (40 mg total) by mouth two (2) times daily.   ? fentaNYL 12 mcg/hr patch Place 1 patch onto the skin every three (3) days. Max Daily Amount: 1 patch   ? glycopyrrolate (ROBINUL) 1 mg tablet Give 1 tablet (1 mg total) by Per G Tube route at bedtime.   ? metoprolol tartrate 25 mg tablet Give one-half tablets (12.5 mg total) by Per G Tube route two (2) times daily.   ? ondansetron 4 mg/58mL solution Take 5 mLs (4 mg total) by mouth every eight (8) hours as needed for Nausea or Vomiting.   ? oxyCODONE 5 mg/5 mL solution Give 5 mLs (5 mg total) by Per G Tube route every six (6) hours as needed. Max Daily Amount: 20 mg   ? pantoprazole 2 mg/mL oral suspension Take 20 mLs (40 mg total) by mouth two (2) times daily.   ? triamcinolone 0.1% cream Apply to itchy skin twice daily as needed. Avoid face, groin, armpits.   ? White Petrolatum-Mineral Oil (ARTIFICIAL TEARS) 83-15% ophthalmic ointment Place into both eyes four (4) times daily.   ? zinc sulfate heptahydrate solution Give 12.3 mLs (50 mg of elemental zinc total) by Per G Tube route daily for 10 days.     No current facility-administered medications for this encounter.      Allergies   Allergen Reactions   ? Amlodipine Besylate Rash   ? Clarithromycin Rash     PERTINENT PAST MEDICAL HISTORY:   Past Medical History:   Diagnosis Date   ? Allergy, unspecified not elsewhere classified    ? Arthritis    ? Basal cell carcinoma    ? Cancer (HCC/RAF)     Stomach Cancer S/P chemo   ? Depression    ? Gastric lymphoma (HCC/RAF)    ? Gastric lymphoma (HCC/RAF)    ? Gastric lymphoma (HCC/RAF)    ? Hyperlipidemia    ? Hypertension      PAST SURGICAL HISTORY:   Past Surgical History:   Procedure Laterality Date   ? breast implants  1978   ? BREAST SURGERY        PREVIOUS MALIGNANCY: yes gastric lymphoma  PREVIOUS RADIATION THERAPY: none  PREVIOUS CHEMOTHERAPY: yes - 2005 for gastric adenocarcinoma      Radiation Oncology 3Ps             Family History   Problem Relation Age of Onset   ? Heart attack Father    ? Cancer Sister      Social History     Tobacco Use   ? Smoking status: Never   Vaping Use   ? Vaping Use: Never used   Substance Use Topics   ? Alcohol use: No   ? Drug use: No       Marital Status: widowed  Lives with: alone in [place] of Residence: [address]  [place] [address]  Occupation:   Occupational History   ? Not on file     Able to perform activities of daily living: Yes       Objective:      Physical Exam:  BP 142/85  ~ Pulse 67  ~ Temp 36.5 ?C (97.7 ?F) (Tympanic)  ~ Resp 14  ~ SpO2 99%      ECOG - [place] Cooperative Oncology Group performance status: ECOG Score - 3 - Capable of only limited  selfcare, confined to bed or chair more than 50% of waking hours.    Physical Exam  Constitutional:       Appearance: She is well-developed.   HENT:      Head: Normocephalic.   Eyes:      Pupils: Pupils are equal, round, and reactive to light.   Cardiovascular:      Rate and Rhythm: Normal rate.   Pulmonary:      Effort: Pulmonary effort is normal.   Musculoskeletal:      Cervical back: Neck supple.   Skin:     General: Skin is warm and dry. Neurological:      Mental Status: She is alert and oriented to person, place, and time.       Lab Review / Pathology / Radiology:     As per HPI.        Assessment:      Erika Kline is a 87 y.o. female with PMH of gastric lymphoma in remission since 2005, paroxysmal atrial fibrillation on Apixaban, non-rheumatic MR s/p mitraclip, with a newly diagnosed esophageal Squamous cell carcinoma.      Plan/ Recommendation:      We reviewed the patient?s detailed clinical history, imaging and pathology. We recommended that a course of palliative radiation would help with her swallowing/dysphagia and potentially allow her to pleasure feed/drink. It may improve her nausea if the sources of nausea is from the esophageal stenosis.     PLAN  - Patient and her daughter would like to think about treatment options and decisions  - Likely leaning towards 10 Fx palliatve regimen vs. No radiation.       We discussed the risks and benefits of radiation therapy in the context of the patient?s disease, as well as potential alternatives to radiation therapy. We explained that there may be both early and late side effects associated with radiation therapy. Acutely, there is the risk of skin reaction, nausea, vomiting, fatigue, and decreased blood counts (which may put the patient at risk for infection or bleeding), dry cough, radiation pneumonitis (inflammation of the lungs due to radiation), and esophagitis (inflammation of the esophagus due to radiation). These effects are usually self-limited and improve approximately 1-2 weeks after the end of treatment, but may persist in some patients. On a long-term basis, there is risk of brachial plexopathy (permanent damage to the nerves of the brachial plexus), and pulmonary fibrosis leading to decreased pulmonary reserve, which may render the patient oxygen dependent. There is a remote possibility of injury to the spinal cord, heart, or esophageal stricture. We discussed the risks, benefits, alternatives, likelihood of success, and possible results of non-treatment.    High level of Medical Decision Making: (at least 2 of below)  [x]  High complexity of problems addressed. There were no encounter diagnoses. This poses a threat to life or bodily function.  [x]  Extensive amount/complexity of data personally reviewed and analyzed by me (at least 2 of below)   ? [x]  Category 1 (tests, documents, independent historians): review of prior external notes [providers/dates: since 03/2021], review of test results (CT scan [dates: since 03/2021]), assessment requiring independent historian  ? [x]  Category 2 (independent interpretation of tests): CT scan [dates: since 03/2021]  ? []  Category 3 (discussion of management or test interpretation): referring provider and medical oncologist  [x]  High risk of complications/morbidity/mortality of treatment due to inherent nature of radiation therapy and intensive monitoring for radiation therapy toxicity, with or without chemotherapy  cc Patient Care Team:  Aura Fey., MD, MPH as Consult - Attending (Radiation Oncology)  Roxanne Gates., MD (Medicine, Hematology & Oncology)    Author: Woodward Ku. Savjani 05/29/2021 5:32 PM     I attended with the resident physician the patient's consultation visit. I have seen and examined the patient. I discussed in detail treatment options, rationales and indication or  treatment as  well  as  the short  and  long term  side-effects of  the possible radiation treatment. I have reviewed the resident's report and agree with the documented findings and plan of care.    Aura Fey, M.D., M.P.H.

## 2021-05-29 ENCOUNTER — Inpatient Hospital Stay: Payer: MEDICARE | Attending: Radiation Oncology

## 2021-05-29 DIAGNOSIS — C159 Malignant neoplasm of esophagus, unspecified: Secondary | ICD-10-CM

## 2021-05-29 MED ORDER — PANTOPRAZOLE 2 MG/ML ORAL SUSPENSION (ADULT)
40 mg | Freq: Two times a day (BID) | ORAL | 0 refills | Status: AC
Start: 2021-05-29 — End: 2021-06-06

## 2021-05-29 MED ORDER — FAMOTIDINE 40 MG/5ML PO SUSR
40 mg | Freq: Two times a day (BID) | ORAL | 2 refills | Status: AC
Start: 2021-05-29 — End: 2021-06-06

## 2021-06-04 ENCOUNTER — Telehealth: Payer: MEDICARE

## 2021-06-04 NOTE — Telephone Encounter
Call Back Request      Reason for call back: patient's daughter is requesting to speak with NP Aileen in regards to patient's mental and emotional state. She wants to know if patient can be referred somewhere or how they should proceed. Please advise.     CB#: [phone]    Any Symptoms:  []  Yes  [x]  No       If yes, what symptoms are you experiencing:    o Duration of symptoms (how long):    o Have you taken medication for symptoms (OTC or Rx):      If call was taken outside of clinic hours:    [] Patient or caller has been notified that this message was sent outside of normal clinic hours.     [] Patient or caller has been warm transferred to the physician's answering service. If applicable, patient or caller informed to please call us back if symptoms progress.  Patient or caller has been notified of the turnaround time of 1-2 business day(s).

## 2021-06-04 NOTE — Telephone Encounter
Call Back Request      Reason for call back: patient's daughter is requesting to know if appointment scheduled on 06/06/2020 can be changed to a VV. Please advise.     CB#: [phone]    Any Symptoms:  []  Yes  [x]  No       If yes, what symptoms are you experiencing:    o Duration of symptoms (how long):    o Have you taken medication for symptoms (OTC or Rx):      If call was taken outside of clinic hours:    [] Patient or caller has been notified that this message was sent outside of normal clinic hours.     [] Patient or caller has been warm transferred to the physician's answering service. If applicable, patient or caller informed to please call us back if symptoms progress.  Patient or caller has been notified of the turnaround time of 1-2 business day(s).

## 2021-06-05 ENCOUNTER — Ambulatory Visit: Payer: MEDICARE

## 2021-06-05 DIAGNOSIS — E785 Hyperlipidemia, unspecified: Secondary | ICD-10-CM

## 2021-06-05 DIAGNOSIS — I340 Nonrheumatic mitral (valve) insufficiency: Secondary | ICD-10-CM

## 2021-06-05 DIAGNOSIS — Z9889 Other specified postprocedural states: Secondary | ICD-10-CM

## 2021-06-05 DIAGNOSIS — Z95818 Presence of other cardiac implants and grafts: Secondary | ICD-10-CM

## 2021-06-05 DIAGNOSIS — F331 Major depressive disorder, recurrent, moderate: Secondary | ICD-10-CM

## 2021-06-05 DIAGNOSIS — I4891 Unspecified atrial fibrillation: Secondary | ICD-10-CM

## 2021-06-05 DIAGNOSIS — E43 Unspecified severe protein-calorie malnutrition: Secondary | ICD-10-CM

## 2021-06-05 DIAGNOSIS — C159 Malignant neoplasm of esophagus, unspecified: Secondary | ICD-10-CM

## 2021-06-05 DIAGNOSIS — Z931 Gastrostomy status: Secondary | ICD-10-CM

## 2021-06-05 DIAGNOSIS — K219 Gastro-esophageal reflux disease without esophagitis: Secondary | ICD-10-CM

## 2021-06-05 DIAGNOSIS — L989 Disorder of the skin and subcutaneous tissue, unspecified: Secondary | ICD-10-CM

## 2021-06-05 DIAGNOSIS — G893 Neoplasm related pain (acute) (chronic): Secondary | ICD-10-CM

## 2021-06-05 DIAGNOSIS — R269 Unspecified abnormalities of gait and mobility: Secondary | ICD-10-CM

## 2021-06-05 DIAGNOSIS — K5903 Drug induced constipation: Secondary | ICD-10-CM

## 2021-06-05 DIAGNOSIS — R6889 Other general symptoms and signs: Secondary | ICD-10-CM

## 2021-06-05 DIAGNOSIS — F419 Anxiety disorder, unspecified: Secondary | ICD-10-CM

## 2021-06-05 DIAGNOSIS — R112 Nausea with vomiting, unspecified: Secondary | ICD-10-CM

## 2021-06-05 NOTE — Progress Notes
GERIATRICS OUTPATIENT PROGRESS NOTE    PATIENT: Erika Kline  MRN: [id number]  DOB: 08/26/1933  DATE OF SERVICE: 06/05/2021    PRIMARY CARE PROVIDER: Evaristo Bury., MD      CHIEF COMPLAINT  Chief Complaint   Patient presents with   ? Establish Care     Pt would like to review medications, prior auth for famotidine          HISTORY OF PRESENT ILLNESS  Erika Kline is a 88 y.o.      Oncologist Dr. Jerelyn Charles   Rad onc Dr. 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   D/ced to snf on abx for abscess near g tube site     Admit [facility] 12/9-12/15     1. History of esophageal cancer with no definite metastatic disease in the abdomen or pelvis. Single peri-centimeter gastrohepatic ligament lymph node, indeterminate. Attention on follow-up.  2. Moderate intrahepatic and extrahepatic biliary ductal dilatation and mild diffuse dilatation of the main pancreatic duct. Consider further evaluation with an MRCP on a nonemergent basis.    Per rad onc offered course of palliative radiation therapy would help her swallowing/dysphagia.     On fentanyl patch and oxy q 6 hrs     Has been having constipation today improved w/ prune juice daily   Feeling better after bm but nausea still there.     Only sees pt once a week, the curren      Stopped taking lexapro a few years ago     Was on 39  Ml for the tube feeds prior was 65       CHRONIC CONDITIONS  1.  []  Improved  []  Worsened  []  Stable   2.  []  Improved  []  Worsened  []  Stable   3. []  Improved  []  Worsened  []  Stable     Past Medical History:   Diagnosis Date   ? Allergy, unspecified not elsewhere classified    ? Arthritis    ? Basal cell carcinoma    ? Cancer (HCC/RAF)     Stomach Cancer S/P chemo   ? Depression    ? Gastric lymphoma (HCC/RAF)    ? Gastric lymphoma (HCC/RAF)    ? Gastric lymphoma (HCC/RAF)    ? Hyperlipidemia    ? Hypertension      Past Surgical History:   Procedure Laterality Date   ? breast implants  1978   ? BREAST SURGERY       Family History   Problem Relation Age of Onset   ? Heart attack Father    ? Cancer Sister          MEDICATIONS  Medications that the patient states to be currently taking   Medication Sig   ? triamcinolone 0.1% cream Apply to itchy  skin twice daily as needed. Avoid face, groin, armpits.   ? White Petrolatum-Mineral Oil (ARTIFICIAL TEARS) 83-15% ophthalmic ointment Place into both eyes four (4) times daily.   ? [DISCONTINUED] apixaban 2.5 mg tablet Take 1 tablet (2.5 mg total) by mouth two (2) times daily.   ? [DISCONTINUED] famotidine 40 mg/5 mL suspension Take 5 mLs (40 mg total) by mouth two (2) times daily.   ? [DISCONTINUED] fentaNYL 12 mcg/hr patch Place 1 patch onto the skin every three (3) days. Max Daily Amount: 1 patch   ? [DISCONTINUED] glycopyrrolate (ROBINUL) 1 mg tablet Give 1 tablet (1 mg total) by Per G Tube route at bedtime.   ? [DISCONTINUED] metoprolol tartrate 25 mg tablet Give one-half tablets (12.5 mg total) by Per G Tube route two (2) times daily.   ? [DISCONTINUED] ondansetron 4 mg/33mL solution Take 5 mLs (4 mg total) by mouth every eight (8) hours as needed for Nausea or Vomiting.   ? [DISCONTINUED] oxyCODONE 5 mg/5 mL solution Give 5 mLs (5 mg total) by Per G Tube route every six (6) hours as needed. Max Daily Amount: 20 mg   ? [DISCONTINUED] pantoprazole 2 mg/mL oral suspension Take 20 mLs (40 mg total) by mouth two (2) times daily.         PERTINENT SOCIAL HISTORY  Social History     Socioeconomic History   ? Marital status: Widowed   Tobacco Use   ? Smoking status: Never   Vaping Use   ? Vaping Use: Never used   Substance and Sexual Activity   ? Alcohol use: No   ? Drug use: No   ? Sexual activity: Never   Other Topics Concern   ? Need Help Feeding Yourself? No   ? Need Help Getting from Bed to Chair? No   ? Need Help Getting to the Toilet? No   ? Need Help Getting Dressed? No   ? Need Help Bathing or Showering? No   ? Need Help Walking Across the Road? (Includes [place]) No   ? Need Help Using the Telephone? No   ? Need Help Taking your Medications? No   ? Need Help Preparing Meals? No   ? Need Help Managing Money? (Tracking Expenses, Paying Bills) No   ? Need Help Doing Moderately Strenuous Housework? (ex. Laundry) No   ? Need Help Shopping for Personal Items? (Toiletries, Medicines) No   ? Need Help Shopping for Groceries? No   ? Need Help Driving? No   ? Need Help Climbing a Flight of Stairs? No   ? Need Help Getting Places Beyond Walking Distance? (Bus, Taxi) No   ? Do you live with someone who assists you at home? No   ? Do you get help from family members or friends in your home? No   ? Do you employ someone to provide health related care or help you in your home? No   ? Do you provide care for a family member? No   ? Does your home have rugs in the hallway? No   ? Does your home have poor lighting? No   ? Does your home lack grab bars in the bathroom? No   ? Does your home lack handrails on the stairs? No   ? Have you noticed any hearing difficulties? No   ? Do you currently participate in any regular activity to improve or maintain your physical fitness? No   ? Do you always wear a seatbelt when  you ride in a car? No   ? If you drink alcohol, do you drink more than 7 drinks per week or more than 3 drinks on any given day? No   ? Has anyone ever been concerned about your drinking? No   Social History Narrative    Lives w/ daughter           REVIEW OF SYSTEMS   (Check if unchanged, or note how changed)     [x]  Constitutional  []  Mental Status  []  Mood    []  Eyes  []  ENT  []  Cardiovascular    [x]  Resp  []  GI  []  GU    []  Neuro  []  Endo  []  Heme/Lymph    []  Musculoskeletal   []  Gait/balance  []  Cognition    []  Allergy/Immune  []  Skin             PHYSICAL EXAM (Check if Normal, or note positive findings)  VITALS  BP 99/65  ~ Pulse 73  ~ Temp 36.1 ?C (96.9 ?F) (Forehead)  ~ Wt 102 lb 12.8 oz (46.6 kg)  ~ SpO2 98%  ~ BMI 17.11 kg/m?    BP Readings from Last 3 Encounters:   06/05/21 99/65   05/29/21 142/85   05/16/21 103/58     Wt Readings from Last 3 Encounters:   06/05/21 102 lb 12.8 oz (46.6 kg)   05/12/21 100 lb 8 oz (45.6 kg)   10/04/15 107 lb 6.4 oz (48.7 kg)        Gen  [x]  NAD GI  [x]  Abd       []  Rectal  []  Organomegaly      [x]  Masses [x]  Guard/Rebnd Tend        Eyes  [x]  Conj/Lids  [x]  Pupils  []  Fundi GU  Fem:   []  Ext []  Vagina []   Cervix   []   Uterus/Adnexa         Female:   []  Penis []  Scrotum []  Prost     ENT  []  Ears/otosc  []  Nasal Muc  [x]  Oroph  []  Hearing   Neuro  []  A/O []  CN2-12  []  DTR/bbski    Neck  [x]  Inspect/Palp  []  Thyroid     []  motor/sens   Breast  []  Inspect  []  Palpation   MS  []  Gait       []  Insp/palp     []  Tone      []  ROM   Resp  [x]  Effort  [x]  Auscultate  []  Percuss     []  Balance []  Back   CV  [x]  Auscultate  []  Carotids  PULSES: []  Pedal  [] Fem        [x]  Edema   Skin  [x]  Inspect  [x]  Palp   Lymph  [x]  Neck  []  Axillary  []   Femoral   Psych  []  Insight/judgment     [x]  Affect      []  Cognition     Details of positive findings:  Siting in WC   g tube in place, peritube granulation tissue, minimal erythema no e/o pus   Cachectic  No edema   Lungs ctab   SK behind L knee   Nodule R wrist        RECENT LABS (reviewed by me)   Lab Requisition on 05/21/2021   Component Date Value Ref Range Status   ? CLINICAL INFORMATION 05/21/2021    Final  Value:This result contains rich text formatting which cannot be displayed here.   ? FINAL DIAGNOSIS 05/21/2021    Final Value:This result contains rich text formatting which cannot be displayed here.   ? MICROSCOPIC DESCRIPTION 05/21/2021    Final                    Value:This result contains rich text formatting which cannot be displayed here.   ? GROSS DESCRIPTION 05/21/2021    Final                    Value:This result contains rich text formatting which cannot be displayed here.   ? IHC REPORT 05/21/2021    Final                    Value:This result contains rich text formatting which cannot be displayed here.   ? Signatures 05/21/2021    Final                    Value:This result contains rich text formatting which cannot be displayed here.   ? CASE REPORT 05/21/2021    Final                    Value:Surgical Pathology Report                         Case: [id number]                                Authorizing Provider:  Roxanne Gates., MD          Collected:           05/21/2021 0900              Ordering Location:     [facility]  Received:            05/21/2021 0901              Pathologist:           Yisroel Ramming., MD                                                             Specimen:    Consult Review, ESOPHAGEAL MASS (BIOPSY)                                                         STUDIES (reviewed by me)         ASSESSMENT AND PLAN    Malignant neoplasm of esophagus, unspecified location (HCC/RAF)  -     Home Health - Admin Use Only (Use Communication Activity for details); Future  -     Home Health Preferred Agency; Future  -     Referral to Palliative Care (Clinic or Home)  -     Home Health: Physicial Therapy - Services include: Home Safety Evaluation, Investment banker, operational, Teacher, early years/pre; Future  -     fentaNYL 12 mcg/hr patch; Place 1  patch onto the skin every three (3) days. Max Daily Amount: 1 patch  -     Home Health: Social Work; Future  -     Research officer, political party; Future  -     CBC & Auto Differential; Future  - has follow up with oncology later this week, has been seen by rad-onc, deciding re palliative xrt    Gastrointestinal tube present (HCC/RAF)  -     Home Health - Admin Use Only (Use Communication Activity for details); Future  -     Home Health Preferred Agency; Future  -     Referral to Palliative Care (Clinic or Home)  -     Skilled Nursing: G Tube; Future  -     Home Health: Physicial Therapy - Services include: Home Safety Evaluation, Investment banker, operational, Teacher, early years/pre; Future  -     fentaNYL 12 mcg/hr patch; Place 1 patch onto the skin every three (3) days. Max Daily Amount: 1 patch  -     Home Health: Social Work; Future  -     Referral to Clinical Nutrition - Medical Nutrition Counseling including Weight Loss    Gait disorder  -     Home Health Preferred Agency; Future  -     Home Health: Physicial Therapy - Services include: Home Safety Evaluation, Investment banker, operational, Teacher, early years/pre; Future  -     Home Health: Social Work; Future  - dmv placard completed     Gastroesophageal reflux disease, unspecified whether esophagitis present  -     famotidine 40 mg/5 mL suspension; Take 5 mLs (40 mg total) by mouth two (2) times daily.  -     pantoprazole 2 mg/mL oral suspension; Take 20 mLs (40 mg total) by mouth two (2) times daily.    Atrial fibrillation, unspecified type (HCC/RAF)  -     metoprolol tartrate 25 mg tablet; Give one-half tablets (12.5 mg total) by Per G Tube route two (2) times daily.  -     apixaban 2.5 mg tablet; Take 1 tablet (2.5 mg total) by mouth two (2) times daily.  -     TSH with reflex FT4, FT3; Future    Anxiety/MDD   MDD (major depressive disorder), recurrent episode, moderate (HCC/RAF)  -     sertraline 20 mg/mL solution; Take 1.3 mLs (25 mg total) by mouth daily.-- will see if tolerates, can increase to 50 mg weekly    Skin lesion  -     Referral to Dermatology    Drug-induced constipation  - c/w prunes   - consider miralax...     Severe protein-calorie malnutrition (HCC/RAF)  - will have nutrition assessment re tube feeds    Nonrheumatic mitral valve regurgitation  S/P mitral valve clip implantation  - chronic stable     Cancer related pain  -     oxyCODONE 5 mg/5 mL solution; Give 5 mLs (5 mg total) by Per G Tube route every six (6) hours as needed. Max Daily Amount: 20 mg  -     fentaNYL 12 mcg/hr patch; Place 1 patch onto the skin every three (3) days. Max Daily Amount: 1 patch    Hyperlipidemia, unspecified hyperlipidemia type  - off atorva     Excessive oral secretions  -     glycopyrrolate (ROBINUL) 1 mg tablet; Give 1 tablet (1 mg total) by Per G Tube route at bedtime.    Nausea and vomiting, unspecified vomiting type  -  ondansetron 4 mg/46mL solution; Take 5 mLs (4 mg total) by mouth every eight (8) hours as needed for Nausea or Vomiting.       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   HLD   Depression   Severe malnutrition   Hx of covid   Cancer related pain - fentanyl 12 mcg q 72 hrs, oxycodone 5mg  q 6 hrs prn glycopyrrolate 1mg  at bedtime for secreations   GERD  - pantoprazoe, famotidine       Follow up next week       I performed the following items on the day of service:    [x]  Preparing to see the patient (e.g., review of tests)  [x]  Obtaining and/or reviewing separately obtained history   [x]  Performing a medically appropriate examination and/or evaluation   [x]  Counseling and educating the patient/family/caregiver   [x]  Ordering medications, tests, or procedures  []  Referring and communicating with other healthcare professionals (when not separately reported)  [x]  Documenting clinical information in the EHR  []  Independently interpreting results and communicating results to patient/family/caregiver    I spent the following total amount of time on these tasks on the day of service:  New Patient     Established Patient  []  15-29 minutes - 99202    []  up to 9 minutes - 99211  []  30-44 minutes - 99203     []  10-19 minutes - 99212   []  45-59 minutes - 99204      []  20-29 minutes - 99213   [x]  60-74 minutes - 99205   []  30-39 minutes - 99214         []  40-55 minutes - 99215    []  I spent an additional 15-minute-increment(s) for a total of minutes on these tasks on the day of service. [phone] for each additional 15 minutes.)    FOLLOW UP  Return in about 2 weeks (around 06/19/2021).       Author    Aubrianne Molyneux B. Huel Cote 06/05/2021     The above plan of care, diagnosis, orders, and follow-up were discussed with the patient.  Questions related to this recommended plan of care were answered.    There are no Patient Instructions on file for this visit.

## 2021-06-06 ENCOUNTER — Telehealth: Payer: MEDICARE

## 2021-06-06 ENCOUNTER — Non-Acute Institutional Stay: Payer: MEDICARE

## 2021-06-06 LAB — CBC: NUCLEATED RBC%, AUTOMATED: 0 (ref 143–398)

## 2021-06-06 LAB — Comprehensive Metabolic Panel: ALANINE AMINOTRANSFERASE: 24 U/L (ref 8–70)

## 2021-06-06 LAB — Differential Automated: ABSOLUTE BASO COUNT: 0.08 10*3/uL (ref 0.00–0.10)

## 2021-06-06 LAB — TSH with reflex FT4, FT3: TSH: 2.3 u[IU]/mL (ref 0.3–4.7)

## 2021-06-06 MED ORDER — METOPROLOL TARTRATE 25 MG PO TABS
12.5 mg | ORAL_TABLET | Freq: Two times a day (BID) | GASTROSTOMY | 3 refills | Status: AC
Start: 2021-06-06 — End: ?

## 2021-06-06 MED ORDER — SERTRALINE HCL 20 MG/ML PO CONC
25 mg | Freq: Every day | ORAL | 3 refills | Status: AC
Start: 2021-06-06 — End: ?

## 2021-06-06 MED ORDER — PANTOPRAZOLE 2 MG/ML ORAL SUSPENSION (ADULT)
40 mg | Freq: Two times a day (BID) | ORAL | 6 refills | Status: AC
Start: 2021-06-06 — End: 2021-06-08

## 2021-06-06 MED ORDER — APIXABAN 2.5 MG PO TABS
2.5 mg | ORAL_TABLET | Freq: Two times a day (BID) | ORAL | 3 refills | Status: AC
Start: 2021-06-06 — End: ?

## 2021-06-06 MED ORDER — ONDANSETRON HCL 4 MG/5ML PO SOLN
4 mg | Freq: Three times a day (TID) | ORAL | 6 refills | Status: AC | PRN
Start: 2021-06-06 — End: 2021-06-18

## 2021-06-06 MED ORDER — OXYCODONE HCL 5 MG/5ML PO SOLN
5 mg | Freq: Four times a day (QID) | GASTROSTOMY | 0 refills | Status: AC | PRN
Start: 2021-06-06 — End: 2021-06-08

## 2021-06-06 MED ORDER — FENTANYL 12 MCG/HR TD PT72
1 | MEDICATED_PATCH | TRANSDERMAL | 0 refills | Status: AC
Start: 2021-06-06 — End: ?

## 2021-06-06 MED ORDER — GLYCOPYRROLATE 1 MG PO TABS
1 mg | ORAL_TABLET | Freq: Every evening | GASTROSTOMY | 1 refills | Status: AC
Start: 2021-06-06 — End: 2021-06-15

## 2021-06-06 MED ORDER — FAMOTIDINE 40 MG/5ML PO SUSR
40 mg | Freq: Two times a day (BID) | ORAL | 6 refills | Status: AC
Start: 2021-06-06 — End: ?

## 2021-06-06 NOTE — Telephone Encounter
OUTPATIENT PALLIATIVE CARE REFERRAL     I contacted the patient at [phone]  to follow up on the Palliative Care referral. I spoke with patient  and discussed the Palliative Care referral. I answered any questions or concerns regarding palliative care and offered assistance scheduling a palliative care consultation.  I faxed the referral to the following,     Dyke Brackett -in review   P# [phone]  F# [phone]      Thank you,   Ayiana Winslett Polly Cobia   Hospitalist/ Palliative Care Services

## 2021-06-06 NOTE — Patient Instructions
-   referral to [facility] palliative and home health   - consider hospice eval for informational purposes   - labs today

## 2021-06-07 ENCOUNTER — Telehealth: Payer: MEDICARE

## 2021-06-07 ENCOUNTER — Ambulatory Visit: Payer: MEDICARE

## 2021-06-07 ENCOUNTER — Non-Acute Institutional Stay: Payer: MEDICARE

## 2021-06-07 DIAGNOSIS — G893 Neoplasm related pain (acute) (chronic): Secondary | ICD-10-CM

## 2021-06-07 DIAGNOSIS — R269 Unspecified abnormalities of gait and mobility: Secondary | ICD-10-CM

## 2021-06-07 DIAGNOSIS — K219 Gastro-esophageal reflux disease without esophagitis: Secondary | ICD-10-CM

## 2021-06-07 DIAGNOSIS — C159 Malignant neoplasm of esophagus, unspecified: Secondary | ICD-10-CM

## 2021-06-07 MED ORDER — PANTOPRAZOLE 2 MG/ML ORAL SUSPENSION (ADULT)
40 mg | Freq: Two times a day (BID) | ORAL | 6 refills | Status: AC
Start: 2021-06-07 — End: 2021-06-22

## 2021-06-07 MED ORDER — PANTOPRAZOLE 2 MG/ML ORAL SUSPENSION (ADULT)
40 mg | Freq: Two times a day (BID) | ORAL | 6 refills | Status: AC
Start: 2021-06-07 — End: 2021-06-08

## 2021-06-07 MED ORDER — OXYCODONE HCL 5 MG/5ML PO SOLN
5 mg | Freq: Four times a day (QID) | GASTROSTOMY | 0 refills | Status: AC | PRN
Start: 2021-06-07 — End: ?

## 2021-06-07 NOTE — Telephone Encounter
Insurance info has been faxed over.  

## 2021-06-07 NOTE — Telephone Encounter
Call Back Request      Reason for call back: Per Collie Siad calling from [facility] to ask Dr. Sanjuana Kava office to send over Pt.'s medicare insurance information. The information received is the old medicare member ID number and they need the updated medicare ID number. Without this information the are is being delayed. Please advise, thank you.     Fax:[phone]    Any Symptoms:  []  Yes  [x]  No       If yes, what symptoms are you experiencing:    o Duration of symptoms (how long):    o Have you taken medication for symptoms (OTC or Rx):      If call was taken outside of clinic hours:    [] Patient or caller has been notified that this message was sent outside of normal clinic hours.     [] Patient or caller has been warm transferred to the physician's answering service. If applicable, patient or caller informed to please call us back if symptoms progress.  Patient or caller has been notified of the turnaround time of 1-2 business day(s).

## 2021-06-10 NOTE — Addendum Note
Encounter addended by: Gwynneth Albright., MD, MPH on: 06/10/2021 12:17 PM   Actions taken: Clinical Note Signed

## 2021-06-11 NOTE — Telephone Encounter
Spoke with daughter and confirmed apportionment.    Thank you,  Richardson Landry

## 2021-06-12 ENCOUNTER — Non-Acute Institutional Stay: Payer: MEDICARE

## 2021-06-12 ENCOUNTER — Telehealth: Payer: MEDICARE

## 2021-06-13 ENCOUNTER — Non-Acute Institutional Stay: Payer: MEDICARE | Attending: Neurology

## 2021-06-13 ENCOUNTER — Ambulatory Visit: Payer: MEDICARE

## 2021-06-13 ENCOUNTER — Non-Acute Institutional Stay: Payer: MEDICARE

## 2021-06-13 NOTE — Telephone Encounter
I spoke with pt's drt-   She is unable to set up an apt today. PV is booked up tomorrow. I offered apt during your lunch time on Tuesday.     Future Appointments      Jun 13, 2021  2:00 PM  RETURN with Marguerita Beards. Constance Holster, [facility] (MEDICINE) [place]  [address] [place] [address]  [phone]     Jun 18, 2021 12:15 PM  Telephone Encounter with Colbert Ewing B. Beverely Risen, MD  [facility] (MEDICINE) [address]  [place]  [place] [address]  [phone]     Jun 19, 2021  1:30 PM  IV Placement with [facility] [place] (RADIATION ONCOLOGY) [place] [address]  [phone]     Jun 19, 2021  2:00 PM  CT RADIATION THERAPY PLANNING with Gwynneth Albright, MD, MPH, [place] (RADIATION ONCOLOGY) [address] [place]  [place] [address]  [phone]

## 2021-06-13 NOTE — Telephone Encounter
Ok add on tomorrow at 73 If that works for them

## 2021-06-13 NOTE — Telephone Encounter
I offered apt to pt-   I reached out via mychart.     Future Appointments      Jun 13, 2021  2:00 PM  RETURN with Marguerita Beards. Constance Holster, [facility] (MEDICINE) [address] [place]  [address] [place] [address]  [phone]     Jun 14, 2021  4:30 PM  Telephone Encounter with Colbert Ewing B. Beverely Risen, MD  [facility] (MEDICINE) [address]  [address] [place] [address]  [phone]     Jun 18, 2021 12:15 PM  Telephone Encounter with Sandy Salaam. Beverely Risen, MD  [facility] (MEDICINE) [address]  [place]  [place] [address]  [phone]     Jun 19, 2021  1:30 PM  IV Placement with [facility] [place] (RADIATION ONCOLOGY) [place]  [place] [address]  [phone]     Jun 19, 2021  2:00 PM  CT RADIATION THERAPY PLANNING with Gwynneth Albright, MD, MPH, [place] (RADIATION ONCOLOGY) [address] [place] [address]  [phone]

## 2021-06-13 NOTE — Telephone Encounter
Myriam Jacobson from [facility] called to let Dr. Beverely Risen know patient is requesting if she can take glyeleyrolate 3x a day instead of 1x a day, nurse also states patient gets car sickness and want's to know if a new prescription could be given to her for neclacine with higher dosage. Patient is also requesting a prescription for xanax for anxiety. For further questions Myriam Jacobson can be reached at [phone].

## 2021-06-13 NOTE — Progress Notes
PATIENT: Erika Kline  MRN: [id number]  DOB: March 24, 1934  DATE OF SERVICE: 06/13/2021  REFERRING PRACTITIONER: No ref. provider found  PRIMARY CARE PROVIDER: Evaristo Bury., MD    Subjective:     Erika Kline is a 88 y.o. female with squamous cell carcinoma of the esophagus.  Here for follow up visit, symptom management, and treatment planning.    Patient living with daughter in [place] since after hospitalization.  Has home health nursing team for additional support at home.  PT twice a week.  Also, working with palliative care team [facility]).    Tube feedings dependent.  On jevity at 65 ml/hour.  Reports to stop continuous feeding every 2 hours to allow for better tolerance (has been working w/ less bloating and nausea).  Intolerance of oral intake.  Fatigue, but states to be ambulatory using walker at home.    Recently seen by Dr. Carleene Cooper (rad/onc).  Plan to do radiation therapy.  Simulation next week and start the week of 1/23.    Denies CP or SOB.  Denies fevers.    Oncologic History:    106/2022  CT chest (done due to SOB/desat with mild exertion): small pulm nodules, indeterminate    11/4-25/2022  Admitted [facility] for nausea/vomiting/weight loss/dysphagia.  Hospitalization.  CT CAP: periph enhancing lesion in mid esoph (1.8cm), unchanged pulm nodules, no findings in the abd/pelvis.  EGD mass at 25 cm, partially obstructing.  Path: mod diff squamous cell carcinoma (no molec workup done).  PEG tube placed.  Few days later, new 3.5 cm fluid collection at g tube insertion site---aspirated/treated with antibiotics.  Sent to SNF for recovery.    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  PAF?on eliquis, CKD, chronic diastolic dysfunction, nonrheumatic MR sp mitraclip    Review of Systems:    Constitutional: No fevers, chills. Fatigue.  Gaining weight.  Ambulatory and using walker.    Eyes: No recent blurry vision, double vision or visual changes.  ENT: No recent sinus pain or congestion. No sore throat or mouth sores.   Neck: No neck pain or stiffness.  Cardiovascular: No chest pain or pressure, no palpitations.   Pulmonary: Shortness of breath, no cough or wheezing.   Gastrointestinal: Occ abdominal pain, nausea, no vomiting.  Min diarrhea.  No melena or BRBPR.   Genitourinary: No urinary frequency, urgency, or dysuria.   Musculoskeletal: No joint pain, muscle pain or back pain.   Neurologic: No headaches. No numbness, tingling or weakness.   Dermatologic: No rash, pruritus or recent skin changes.   Hematologic: No abnormal bruising or bleeding.   Endocrine: No polyuria, polydipsia, heat or cold intolerance.   Psychiatric: No depressive symptoms, no suicidal or homicidal ideation    Objective:     ECOG performance status: 2.    Physical Exam:  General: No acute distress.  Cachectic, but reports to now gaining weight.  Tube feedings (continuously).  Head: Normocephalic, atraumatic.  Eyes: Sclera anicteric. EOMI.  ENT: Hearing grossly normal bilaterally. Oropharynx is clear, mucus membranes are moist.  No oral ulcers noted. Good dentition.  Neck: Supple. Trachea midline.   Cardiac: Regular rate and rhythm. Normal S1, S2. No murmurs, rubs, or gallops.  Respiratory: Clear to auscultation bilaterally. No wheezes, rales, or rhonchi noted. Respiratory effort appears normal.   Abdomen: Soft, nontender and nondistended. Bowel sounds are present and normoactive. No organomegaly is appreciated.  G-tube intact.  Musculoskeletal: No edema. No cyanosis. Extremities are warm and well-perfused.  Extremities: Warm. No edema. No cyanosis.  Neurologic: Gait appears normal. Sensation intact to light touch in all four extremities. Oriented to person, place and time.   Hematologic: No bruising, purpura or petechiae are noted.   Dermatologic: Skin intact.  No rashes appreciated.   Lymphatic: No palpable cervical, supraclavicular, axillary or inguinal adenopathy appreciated.   Psychiatric: Affect appropriate.  Pleasant and conversant.       Assessment/Plan:     Assessment: Erika Kline is a 88 y.o. female with squamous cell carcinoma of the esophagus.  Here for follow up visit, symptom management, and treatment planning.    Plan:  Squamous Cell carcinoma of the esophagus:  Discussed treatment options including observation alone vs standard therapy vs clinical trial (not a candidate).  Discussed radiation therapy.  Followed by Dr. Carleene Cooper & team.  Simulation next week with a plan to start therapy on the week of 06/24/2021.  Discussed side effects and symptom management.  Also, discussed systemic chemotherapy, but not a good candidate at this time.  Molecular analysis to be sent to [facility].  Continue with home health nursing team as additional support at home.  Continue to optimize supportive care.    Malnutrition/Dysphagia:  G-tube continuous feedings.  Strict NPO.  Jevity 65 ml/hour.  Followed closely by dietician and [facility] nursing.  Follow expectantly.      We discussed today extensively this patient's complicated medical and oncologic history with other physicians and allied health professionals involved in the care.   Leone Brand, NP

## 2021-06-14 ENCOUNTER — Telehealth: Payer: MEDICARE

## 2021-06-14 ENCOUNTER — Ambulatory Visit: Payer: MEDICARE

## 2021-06-14 DIAGNOSIS — I480 Paroxysmal atrial fibrillation: Secondary | ICD-10-CM

## 2021-06-14 DIAGNOSIS — F331 Major depressive disorder, recurrent, moderate: Secondary | ICD-10-CM

## 2021-06-14 DIAGNOSIS — R6889 Other general symptoms and signs: Secondary | ICD-10-CM

## 2021-06-14 DIAGNOSIS — H9190 Unspecified hearing loss, unspecified ear: Secondary | ICD-10-CM

## 2021-06-14 DIAGNOSIS — F419 Anxiety disorder, unspecified: Secondary | ICD-10-CM

## 2021-06-14 DIAGNOSIS — C159 Malignant neoplasm of esophagus, unspecified: Secondary | ICD-10-CM

## 2021-06-14 MED ORDER — ALPRAZOLAM 0.25 MG PO TABS
0.25 mg | ORAL_TABLET | Freq: Every day | ORAL | 1 refills | Status: AC | PRN
Start: 2021-06-14 — End: 2021-06-27

## 2021-06-14 MED ORDER — GLYCOPYRROLATE 1 MG PO TABS
1 mg | ORAL_TABLET | Freq: Three times a day (TID) | GASTROSTOMY | 3 refills | Status: AC | PRN
Start: 2021-06-14 — End: ?

## 2021-06-14 NOTE — Patient Instructions
-   audiology   For dermatology, I would recommend:     [facility]    Drs. Rock[place], Myles Rosenthal  [address]  [place], [place]  [phone]     You can also try:     Dr. Mila Merry   [facility]   [address] [place], [place] [address]   [phone]     [facility] [place]  Dr. Carloyn Manner B. [place], [place]  [place], [place] [address]  Tel: [phone]    Dr. Yehuda Savannah- [facility]  [address] [place]   [place] [address]  Tel: [phone]    Dr. Nancie Neas- [facility]  [address] [place], [place] [address]  Tel: [facility]   Dr. Dannial Monarch  Dr. Collene Mares  Dr. Ledora Bottcher   [address]  [place], [place] [address]   Tel: [phone]    [facility]  Dr. Aggie Moats  [place] [address]  Tel: [phone]     [facility]  Dr. Jacelyn Grip   [address]   [place], [place] [address]   [phone]

## 2021-06-14 NOTE — Progress Notes
GERIATRICS OUTPATIENT PROGRESS NOTE    PATIENT: Erika Kline  MRN: [id number]  DOB: Aug 13, 1933  DATE OF SERVICE: 06/14/2021    PRIMARY CARE PROVIDER: Evaristo Bury., MD      CHIEF COMPLAINT  No chief complaint on file.     eval anxiety     HISTORY OF PRESENT ILLNESS  Erika Kline is a 88 y.o.      Oncologist Dr. Jerelyn Charles   Rad onc Dr. Janeth Rase     Here with dtrs laural and casey    Last seen by me 06/05/21   - referral to [facility] palliative and home health   - consider hospice eval for informational purposes   - labs today   - nutrition consult     [name] from [facility] called to let Dr. Huel Cote know patient is requesting if she can take glyeleyrolate 3x a day instead of 1x a day, nurse also states patient gets car sickness and want's to know if a new prescription could be given to her for neclacine with higher dosage. Patient is also requesting a prescription for xanax for anxiety. For further questions [name] can be reached at [phone].      - had ot and nurse came and counselor just     Was on prior to hospital   Lasix   Amiodarone       CHRONIC CONDITIONS  1.  []  Improved  []  Worsened  []  Stable   2.  []  Improved  []  Worsened  []  Stable   3.   []  Improved  []  Worsened  []  Stable     Past Medical History:   Diagnosis Date   ? Allergy, unspecified not elsewhere classified    ? Arthritis    ? Basal cell carcinoma    ? Cancer (HCC/RAF)     Stomach Cancer S/P chemo   ? Depression    ? Gastric lymphoma (HCC/RAF)    ? Gastric lymphoma (HCC/RAF)    ? Gastric lymphoma (HCC/RAF)    ? Hyperlipidemia    ? Hypertension      Past Surgical History:   Procedure Laterality Date   ? breast implants  1978   ? BREAST SURGERY       Family History   Problem Relation Age of Onset   ? Heart attack Father    ? Cancer Sister          MEDICATIONS  No outpatient medications have been marked as taking for the 06/14/21 encounter (Telephone) with Evaristo Bury., MD.     Outpatient Medications Prior to Visit   Medication Sig   ? apixaban 2.5 mg tablet Take 1 tablet (2.5 mg total) by mouth two (2) times daily.   ? famotidine 40 mg/5 mL suspension Take 5 mLs (40 mg total) by mouth two (2) times daily.   ? fentaNYL 12 mcg/hr patch Place 1 patch onto the skin every three (3) days. Max Daily Amount: 1 patch   ? metoprolol tartrate 25 mg tablet Give one-half tablets (12.5 mg total) by Per G Tube route two (2) times daily.   ? ondansetron 4 mg/61mL solution Take 5 mLs (4 mg total) by mouth every eight (8) hours as needed for Nausea or Vomiting.   ? oxyCODONE 5 mg/5 mL solution Give 5 mLs (5 mg total) by Per G Tube route every six (6) hours as needed. Max Daily Amount: 20 mg   ? pantoprazole 2 mg/mL oral suspension Take 20 mLs (40  mg total) by mouth two (2) times daily.   ? sertraline 20 mg/mL solution Take 1.3 mLs (25 mg total) by mouth daily.   ? triamcinolone 0.1% cream Apply to itchy skin twice daily as needed. Avoid face, groin, armpits.   ? White Petrolatum-Mineral Oil (ARTIFICIAL TEARS) 83-15% ophthalmic ointment Place into both eyes four (4) times daily.   ? escitalopram 20 mg tablet Take 1 tablet (20 mg total) by mouth daily. (Patient not taking: Reported on 06/05/2021.)   ? glycopyrrolate (ROBINUL) 1 mg tablet Give 1 tablet (1 mg total) by Per G Tube route at bedtime.     No facility-administered medications prior to visit.           PERTINENT SOCIAL HISTORY  Social History     Socioeconomic History   ? Marital status: Widowed   Tobacco Use   ? Smoking status: Never   Vaping Use   ? Vaping Use: Never used   Substance and Sexual Activity   ? Alcohol use: No   ? Drug use: No   ? Sexual activity: Never   Other Topics Concern   ? Need Help Feeding Yourself? No   ? Need Help Getting from Bed to Chair? No   ? Need Help Getting to the Toilet? No   ? Need Help Getting Dressed? No   ? Need Help Bathing or Showering? No   ? Need Help Walking Across the Road? (Includes [place]) No   ? Need Help Using the Telephone? No   ? Need Help Taking your Medications? No   ? Need Help Preparing Meals? No   ? Need Help Managing Money? (Tracking Expenses, Paying Bills) No   ? Need Help Doing Moderately Strenuous Housework? (ex. Laundry) No   ? Need Help Shopping for Personal Items? (Toiletries, Medicines) No   ? Need Help Shopping for Groceries? No   ? Need Help Driving? No   ? Need Help Climbing a Flight of Stairs? No   ? Need Help Getting Places Beyond Walking Distance? (Bus, Taxi) No   ? Do you live with someone who assists you at home? No   ? Do you get help from family members or friends in your home? No   ? Do you employ someone to provide health related care or help you in your home? No   ? Do you provide care for a family member? No   ? Does your home have rugs in the hallway? No   ? Does your home have poor lighting? No   ? Does your home lack grab bars in the bathroom? No   ? Does your home lack handrails on the stairs? No   ? Have you noticed any hearing difficulties? No   ? Do you currently participate in any regular activity to improve or maintain your physical fitness? No   ? Do you always wear a seatbelt when you ride in a car? No   ? If you drink alcohol, do you drink more than 7 drinks per week or more than 3 drinks on any given day? No   ? Has anyone ever been concerned about your drinking? No   Social History Narrative    Lives w/ daughter           REVIEW OF SYSTEMS   (Check if unchanged, or note how changed)     [x]  Constitutional  []  Mental Status  []  Mood    []  Eyes  []  ENT  []   Cardiovascular    [x]  Resp  []  GI  []  GU    []  Neuro  []  Endo  []  Heme/Lymph    []  Musculoskeletal   []  Gait/balance  []  Cognition    []  Allergy/Immune  []  Skin             PHYSICAL EXAM (Check if Normal, or note positive findings)  VITALS  There were no vitals taken for this visit.   BP Readings from Last 3 Encounters:   06/13/21 123/73   06/05/21 99/65   05/29/21 142/85     Wt Readings from Last 3 Encounters:   06/13/21 103 lb (46.7 kg)   06/05/21 102 lb 12.8 oz (46.6 kg)   05/12/21 100 lb 8 oz (45.6 kg)        Gen  [x]  NAD GI  [x]  Abd       []  Rectal  []  Organomegaly      [x]  Masses [x]  Guard/Rebnd Tend        Eyes  [x]  Conj/Lids  [x]  Pupils  []  Fundi GU  Fem:   []  Ext []  Vagina []   Cervix   []   Uterus/Adnexa         Female:   []  Penis []  Scrotum []  Prost     ENT  []  Ears/otosc  []  Nasal Muc  [x]  Oroph  []  Hearing   Neuro  []  A/O []  CN2-12  []  DTR/bbski    Neck  [x]  Inspect/Palp  []  Thyroid     []  motor/sens   Breast  []  Inspect  []  Palpation   MS  []  Gait       []  Insp/palp     []  Tone      []  ROM   Resp  [x]  Effort  [x]  Auscultate  []  Percuss     []  Balance []  Back   CV  [x]  Auscultate  []  Carotids  PULSES: []  Pedal  [] Fem        [x]  Edema   Skin  [x]  Inspect  [x]  Palp   Lymph  [x]  Neck  []  Axillary  []   Femoral   Psych  []  Insight/judgment     [x]  Affect      []  Cognition     Details of positive findings:  Siting in WC   g tube in place, peritube granulation tissue, minimal erythema no e/o pus   Cachectic  No edema   Lungs ctab   SK behind L knee   Nodule R wrist     - no exam, telephone visit                   RECENT LABS (reviewed by me)   Office Visit on 06/05/2021   Component Date Value Ref Range Status   ? TSH 06/05/2021 2.3  0.3 - 4.7 mcIU/mL Final    TSH is normal, no further Thyroid tests were performed.    If applicable TSH pregnancy reference intervals:   First trimester (10-[redacted] weeks gestation): 0.03- 4.0 mcIU/mL  Second trimester (14-[redacted] weeks gestation): 0.19- 4.0 mcIU/mL     ? Sodium 06/05/2021 136  135 - 146 mmol/L Final   ? Potassium 06/05/2021 5.3  3.6 - 5.3 mmol/L Final   ? Chloride 06/05/2021 97  96 - 106 mmol/L Final   ? Total CO2 06/05/2021 32 (H)  20 - 30 mmol/L Final   ? Anion Gap 06/05/2021 7 (L)  8 - 19 mmol/L Final   ? Glucose 06/05/2021 97  65 - 99 mg/dL Final   ? Creatinine 63/06/6008 0.84  0.60 - 1.30 mg/dL Final   ? Estimated GFR 06/05/2021 68  See GFR Additional Information mL/min/1.45m2 Final   ? GFR Additional Information 06/05/2021 See Comment Final    GFR >89.........Marland KitchenNormal   GFR 60 - 89...Marland KitchenMarland KitchenNormal to mildly decreased   GFR 45 - 59...Marland KitchenMarland KitchenMildly to moderately decreased   GFR 30 - 44...Marland KitchenMarland KitchenModerately to severely decreased   GFR 15 - 29...Marland KitchenMarland KitchenSeverely decreased   GFR <15.........Marland KitchenKidney failure   The 2021 CKD-EPI creatinine equation was used to calculate the estimated GFR and assumes stable creatinine concentrations.   Results are in mL/min/1.73 square meters.  The patient's eGFR MAY need to be adjusted for drug dosing.   For drug dosing, utilize eGFR or eCrCl.   If using the eGFR in very large or very small patients, then multiply the reported eGFR by the estimated BSA and divide by 1.73 m2, in order to obtain eGFR in units of mL/min.   ? Urea Nitrogen 06/05/2021 25 (H)  7 - 22 mg/dL Final   ? Calcium 93/23/5573 9.6  8.6 - 10.4 mg/dL Final   ? Total Protein 06/05/2021 7.1  6.1 - 8.2 g/dL Final   ? Albumin 22/07/5425 3.5 (L)  3.9 - 5.0 g/dL Final   ? Bilirubin,Total 06/05/2021 0.4  0.1 - 1.2 mg/dL Final   ? Alkaline Phosphatase 06/05/2021 129 (H)  37 - 113 U/L Final   ? Aspartate Aminotransferase 06/05/2021 36  13 - 62 U/L Final    NOTE: New Reference Range.     ? Alanine Aminotransferase 06/05/2021 24  8 - 70 U/L Final    NOTE: New Reference Range.       ? White Blood Cell Count 06/05/2021 7.52  4.16 - 9.95 x10E3/uL Final   ? Red Blood Cell Count 06/05/2021 4.23  3.96 - 5.09 x10E6/uL Final   ? Hemoglobin 06/05/2021 13.8  11.6 - 15.2 g/dL Final   ? Hematocrit 11/23/7626 42.2  34.9 - 45.2 % Final   ? Mean Corpuscular Volume 06/05/2021 99.8 (H)  79.3 - 98.6 fL Final   ? Mean Corpuscular Hemoglobin 06/05/2021 32.6  26.4 - 33.4 pg Final   ? MCH Concentration 06/05/2021 32.7  31.5 - 35.5 g/dL Final   ? Red Cell Distribution Width-SD 06/05/2021 55.7 (H)  36.9 - 48.3 fL Final   ? Red Cell Distribution Width-CV 06/05/2021 15.0  11.1 - 15.5 % Final   ? Platelet Count, Auto 06/05/2021 268  143 - 398 x10E3/uL Final   ? Mean Platelet Volume 06/05/2021 10.8  9.3 - 13.0 fL Final   ? Nucleated RBC%, automated 06/05/2021 0.0  No Ref. Range % Final    Percent Reference Range Not Reported per accrediting agency   ? Absolute Nucleated RBC Count 06/05/2021 0.00  0.00 - 0.00 x10E3/uL Final   ? Neutrophil Abs (Prelim) 06/05/2021 4.13  See Absolute Neut Ct. x10E3/uL Final    This is a preliminary result.  If automated differential see Absolute Neut  Count or if manual differential see Absolute Neut Ct, Manual for final result.   ? Neutrophil Percent, Auto 06/05/2021 54.8  No Ref. Range % Final    Percent reference range not reported per accrediting agency.     ? Lymphocyte Percent, Auto 06/05/2021 29.7  No Ref. Range % Final    Percent reference range not reported per accrediting agency.     ? Monocyte Percent, Auto 06/05/2021 11.4  No Ref. Range % Final  Percent reference range not reported per accrediting agency.     ? Eosinophil Percent, Auto 06/05/2021 2.7  No Ref. Range % Final    Percent reference range not reported per accrediting agency.     ? Basophil Percent, Auto 06/05/2021 1.1  No Ref. Range % Final    Percent reference range not reported per accrediting agency.     ? Immature Granulocytes% 06/05/2021 0.3  No Reference Range % Final    Percent reference range not reported per accrediting agency.     ? Absolute Neut Count 06/05/2021 4.13  1.80 - 6.90 x10E3/uL Final   ? Absolute Lymphocyte Count 06/05/2021 2.23  1.30 - 3.40 x10E3/uL Final   ? Absolute Mono Count 06/05/2021 0.86 (H)  0.20 - 0.80 x10E3/uL Final   ? Absolute Eos Count 06/05/2021 0.20  0.00 - 0.50 x10E3/uL Final   ? Absolute Baso Count 06/05/2021 0.08  0.00 - 0.10 x10E3/uL Final   ? Absolute Immature Gran Count 06/05/2021 0.02  0.00 - 0.04 x10E3/uL Final          STUDIES (reviewed by me)         ASSESSMENT AND PLAN      Hearing loss, unspecified hearing loss type, unspecified laterality  -     Referral to Audiology    Excessive oral secretions  -     glycopyrrolate (ROBINUL) 1 mg tablet; 1 tablet (1 mg total) by Per G Tube route three (3) times daily as needed.  Discussed se     Anxiety  -     ALPRAZolam 0.25 mg tablet; Take 1 tablet (0.25 mg total) by mouth daily as needed for Anxiety. Max Daily Amount: 0.25 mg  See below         Malignant neoplasm of esophagus, unspecified location (HCC/RAF)  - planning radiation therapy with Dr. Guilford Shi   -     fentaNYL 12 mcg/hr patch; Place 1 patch onto the skin every three (3) days. Max Daily Amount: 1 patch  - followed by dr Nedra Hai rosen     Gastrointestinal tube present (HCC/RAF    -     Home Health: Social Work; Future  -     Referral to Clinical Nutrition - Medical Nutrition Counseling including Weight Loss    Gait disorder  cw hh pt     Gastroesophageal reflux disease, unspecified whether esophagitis present  -     famotidine 40 mg/5 mL suspension; Take 5 mLs (40 mg total) by mouth two (2) times daily.  -     pantoprazole 2 mg/mL oral suspension; Take 20 mLs (40 mg total) by mouth two (2) times daily.    Atrial fibrillation, unspecified type (HCC/RAF)  -     metoprolol tartrate 25 mg tablet; Give one-half tablets (12.5 mg total) by Per G Tube route two (2) times daily.  -     apixaban 2.5 mg tablet; Take 1 tablet (2.5 mg total) by mouth two (2) times daily.  -     TSH with reflex FT4, FT3; Future  - was on amio and lasix in the past hold for now, will see cards     Anxiety/MDD   MDD (major depressive disorder), recurrent episode, moderate (HCC/RAF)  -     sertraline 20 mg/mL solution; Take 1.3 mLs (25 mg total) by mouth daily.-- will see if tolerates, can increase to 50 mg weekly  - discussed r/b of benzo. Discussed se, ok trial xanax start w/ 1/2 tab prn  Skin lesion  -     Referral to Dermatology    Drug-induced constipation  - c/w prunes   - consider miralax...     Severe protein-calorie malnutrition (HCC/RAF)  - will have nutrition assessment re tube feeds    Nonrheumatic mitral valve regurgitation  S/P mitral valve clip implantation  - chronic stable     Cancer related pain  - oxyCODONE 5 mg/5 mL solution; Give 5 mLs (5 mg total) by Per G Tube route every six (6) hours as needed. Max Daily Amount: 20 mg  -     fentaNYL 12 mcg/hr patch; Place 1 patch onto the skin every three (3) days. Max Daily Amount: 1 patch    Hyperlipidemia, unspecified hyperlipidemia type  - off atorva     Excessive oral secretions  -     glycopyrrolate (ROBINUL) 1 mg tablet; Give 1 tablet (1 mg total) by Per G Tube route at bedtime.    Nausea and vomiting, unspecified vomiting type  -     ondansetron 4 mg/69mL solution; Take 5 mLs (4 mg total) by mouth every eight (8) hours as needed for Nausea or Vomiting.       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   HLD   Depression   Severe malnutrition   Hx of covid   Cancer related pain - fentanyl 12 mcg q 72 hrs, oxycodone 5mg  q 6 hrs prn glycopyrrolate 1mg  at bedtime for secreations   GERD  - pantoprazoe, famotidine       Follow up next week       Per H.R. 6074, Division B, Section 101 and 102, the Coronavirus Preparedness and Response Supplemental Appropriations Act of 2020, this visit was conducted via audio telecommunication with the patient?s verbal consent after verifying their name and date of birth. Vitals were not taken during this telemedicine appointment. The duration of the telemedicine visit was 23  minutes, to include but not limited to review of medical records and care coordination.    FOLLOW UP  No follow-ups on file.       Author    Gurkirat Basher B. Huel Cote 06/14/2021     The above plan of care, diagnosis, orders, and follow-up were discussed with the patient.  Questions related to this recommended plan of care were answered.    Patient Instructions   - audiology   For dermatology, I would recommend:     [facility]    Drs. Anabella Pascucci, Lorin Picket  [address]  [place], [place] [address]  [phone]     You can also try:     Dr. Annye Rusk   [facility]   [address]   [place], [place] [address]   [phone]     [facility] [place]  Dr. Channing Mutters B. [address]  [address]  [place], [place] [address]  Tel: [phone]    Dr. Arne Cleveland- [facility]  [address]   [place] [address]  Tel: [phone]    Dr. Lucienne Minks- [facility]at[facility]  [address]  [place], [place] [address]  Tel: [phone]    [facility]   Dr. Eugenia Pancoast  Dr. Melton Krebs  Dr. Alcus Dad   [address]  [place], [place] [address]  Tel: [phone]    [facility]  Dr. Delma Officer  [address]  [place] [address]  Tel: [phone]     [facility]  Dr. Kelli Hope   [address]   [place], [place] [address]   [phone]

## 2021-06-17 ENCOUNTER — Ambulatory Visit: Payer: MEDICARE

## 2021-06-17 DIAGNOSIS — R112 Nausea with vomiting, unspecified: Secondary | ICD-10-CM

## 2021-06-17 MED ORDER — ONDANSETRON HCL 4 MG/5ML PO SOLN
4 mg | Freq: Three times a day (TID) | ORAL | 6 refills | Status: AC | PRN
Start: 2021-06-17 — End: 2021-06-27

## 2021-06-18 ENCOUNTER — Non-Acute Institutional Stay: Payer: MEDICARE

## 2021-06-18 ENCOUNTER — Telehealth: Payer: MEDICARE

## 2021-06-19 ENCOUNTER — Inpatient Hospital Stay: Payer: MEDICARE | Attending: Radiation Oncology

## 2021-06-19 DIAGNOSIS — C159 Malignant neoplasm of esophagus, unspecified: Secondary | ICD-10-CM

## 2021-06-19 MED ADMIN — IOHEXOL 350 MG/ML IV SOLN: 70 mL | INTRAVENOUS | @ 22:00:00 | Stop: 2021-06-19 | NDC 00407141491

## 2021-06-19 NOTE — Telephone Encounter
Left msg for patient's daughter: Oral contrast cancelled, ok for patient to eat normally prior to CT scan tomorrow.

## 2021-06-20 ENCOUNTER — Ambulatory Visit: Payer: MEDICARE

## 2021-06-20 MED ORDER — LANSOPRAZOLE 30 MG PO CPDR
ORAL | 0 refills | 45.00000 days
Start: 2021-06-20 — End: ?

## 2021-06-20 MED ORDER — PANTOPRAZOLE 2 MG/ML ORAL SUSPENSION (ADULT)
40 mg | Freq: Two times a day (BID) | ORAL | 6 refills
Start: 2021-06-20 — End: ?

## 2021-06-21 ENCOUNTER — Ambulatory Visit: Payer: MEDICARE

## 2021-06-21 DIAGNOSIS — K219 Gastro-esophageal reflux disease without esophagitis: Secondary | ICD-10-CM

## 2021-06-21 MED ORDER — PANTOPRAZOLE 2 MG/ML ORAL SUSPENSION (ADULT)
40 mg | Freq: Two times a day (BID) | ORAL | 6 refills | Status: AC
Start: 2021-06-21 — End: 2021-06-22

## 2021-06-21 MED ORDER — LANSOPRAZOLE 30 MG PO CPDR
0 refills
Start: 2021-06-21 — End: ?

## 2021-06-22 ENCOUNTER — Ambulatory Visit: Payer: MEDICARE

## 2021-06-22 ENCOUNTER — Non-Acute Institutional Stay: Payer: MEDICARE

## 2021-06-22 MED ORDER — ARTIFICIAL TEARS 83-15 % OP OINT
Freq: Four times a day (QID) | OPHTHALMIC | 0 refills | Status: AC
Start: 2021-06-22 — End: 2021-06-27

## 2021-06-22 MED ORDER — PANTOPRAZOLE 2 MG/ML ORAL SUSPENSION (ADULT)
40 mg | Freq: Two times a day (BID) | ORAL | 6 refills | Status: AC
Start: 2021-06-22 — End: ?

## 2021-06-22 MED ORDER — PANTOPRAZOLE 2 MG/ML ORAL SUSPENSION (ADULT)
40 mg | Freq: Two times a day (BID) | ORAL | 6 refills | Status: AC
Start: 2021-06-22 — End: 2021-06-22

## 2021-06-24 ENCOUNTER — Telehealth: Payer: MEDICARE

## 2021-06-24 ENCOUNTER — Non-Acute Institutional Stay: Payer: MEDICARE

## 2021-06-24 DIAGNOSIS — C159 Malignant neoplasm of esophagus, unspecified: Secondary | ICD-10-CM

## 2021-06-24 NOTE — Telephone Encounter
Her x ray looked ok. If she is not having any symptoms anymore, then no need for [name], if she is then please do [name]

## 2021-06-24 NOTE — Telephone Encounter
Harmon Pier from [facility] called. She stated she faxed over xray they did on Ashyra, to your [place] office. She asked if pt still needs to do the [name] ?     Call back # [phone]

## 2021-06-24 NOTE — Telephone Encounter
Spoke eva, informed of message below.    expressed understanding.

## 2021-06-24 NOTE — Telephone Encounter
Hello,  The results were faxed and have been upload under media for you review.

## 2021-06-24 NOTE — Telephone Encounter
I dont see results scanned in do you?

## 2021-06-25 ENCOUNTER — Telehealth: Payer: MEDICARE

## 2021-06-25 ENCOUNTER — Ambulatory Visit: Payer: MEDICARE

## 2021-06-25 NOTE — Telephone Encounter
Patient was called back in regards to ger Enteral feeding supplies, however several attempts were made with no answer. Detailed message was left with instructions to call the office back.

## 2021-06-26 ENCOUNTER — Inpatient Hospital Stay: Payer: MEDICARE | Attending: Radiation Oncology

## 2021-06-26 ENCOUNTER — Non-Acute Institutional Stay: Payer: MEDICARE

## 2021-06-26 ENCOUNTER — Non-Acute Institutional Stay: Payer: MEDICARE | Attending: Radiation Oncology

## 2021-06-26 DIAGNOSIS — Z931 Gastrostomy status: Secondary | ICD-10-CM

## 2021-06-26 DIAGNOSIS — I4891 Unspecified atrial fibrillation: Secondary | ICD-10-CM

## 2021-06-26 DIAGNOSIS — F419 Anxiety disorder, unspecified: Secondary | ICD-10-CM

## 2021-06-26 DIAGNOSIS — R112 Nausea with vomiting, unspecified: Secondary | ICD-10-CM

## 2021-06-27 MED ORDER — METOPROLOL TARTRATE 25 MG PO TABS
12.5 mg | ORAL_TABLET | Freq: Two times a day (BID) | GASTROSTOMY | 3 refills | Status: AC
Start: 2021-06-27 — End: ?

## 2021-06-27 MED ORDER — ALPRAZOLAM 0.25 MG PO TABS
0.25 mg | ORAL_TABLET | Freq: Every day | ORAL | 1 refills | Status: AC | PRN
Start: 2021-06-27 — End: ?

## 2021-06-27 MED ORDER — ARTIFICIAL TEARS 83-15 % OP OINT
Freq: Four times a day (QID) | OPHTHALMIC | 3 refills | Status: AC
Start: 2021-06-27 — End: ?

## 2021-06-27 MED ORDER — APIXABAN 2.5 MG PO TABS
2.5 mg | ORAL_TABLET | Freq: Two times a day (BID) | ORAL | 3 refills | Status: AC
Start: 2021-06-27 — End: ?

## 2021-06-27 MED ORDER — ONDANSETRON HCL 4 MG/5ML PO SOLN
4 mg | Freq: Three times a day (TID) | ORAL | 6 refills | 8.00000 days | Status: AC | PRN
Start: 2021-06-27 — End: ?

## 2021-06-29 ENCOUNTER — Ambulatory Visit: Payer: MEDICARE

## 2021-06-30 ENCOUNTER — Ambulatory Visit: Payer: MEDICARE

## 2021-06-30 NOTE — Treatment Summary
Radiation Oncology General On-Treatment Visit Note    Patient: Erika Kline  MRN: [id number]   DOB: 19-May-1934    Date of Service: 07/01/2021    Referring Practitioner: No ref. provider found  Primary Care Provider: Evaristo Bury., MD  Resident Physician: Maudie Mercury, MD, PhD   Attending Physician: Aura Fey, MD, MPH       Identifying Data: Vidalia Serpas is a 88 y.o. female with PMH of gastric lymphoma in remission since 2005, paroxysmal atrial fibrillation on Apixaban, non-rheumatic MR s/p mitraclip, with a newly diagnosed esophageal Squamous cell carcinoma.       Treatment Detail:  ***    Interval History: Erika Kline is a 88 y.o. female       ECOG - [place] Cooperative Oncology Group performance status: ECOG Score - 2 - Ambulatory and capable of all selfcare but unable to carry out any work activities.  Up and about more than 50% of waking hours.     Physical Exam  Constitutional:       General: She is not in acute distress.     Appearance: She is well-developed.   HENT:      Head: Normocephalic and atraumatic.   Pulmonary:      Effort: Pulmonary effort is normal. No respiratory distress.   Neurological:      Mental Status: She is alert and oriented to person, place, and time.   Psychiatric:         Thought Content: Thought content normal.       Wt Readings from Last 10 Encounters:   06/13/21 1423 103 lb (46.7 kg)   06/05/21 1607 102 lb 12.8 oz (46.6 kg)   05/12/21 2023 100 lb 8 oz (45.6 kg)   05/11/21 1947 100 lb 8 oz (45.6 kg)   05/11/21 0004 (!) 96 lb 12.8 oz (43.9 kg)   05/10/21 1754 100 lb (45.4 kg)   10/04/15 1124 107 lb 6.4 oz (48.7 kg)   08/28/15 1348 111 lb (50.3 kg)   06/07/15 1025 112 lb (50.8 kg)   05/17/13 1447 110 lb (49.9 kg)   01/06/13 0858 104 lb (47.2 kg)   11/25/12 1048 106 lb (48.1 kg)   04/07/12 1521 110 lb (49.9 kg)     There were no vitals taken for this visit.   Pain Information     No pain information on file          Labs/Radiology:  Lab Results   Component Value Date/Time    HGB 13.8 06/05/2021 05:25 PM    HGB 13.2 07/15/2010 03:17 PM    WBC 7.52 06/05/2021 05:25 PM    WBC 5.74 07/15/2010 03:17 PM    PLT 268 06/05/2021 05:25 PM    PLT 150 07/15/2010 03:17 PM    BUN 25 (H) 06/05/2021 05:25 PM    BUN 9 09/18/2011 10:01 AM    BUN 12 03/04/2011 11:06 AM    CREAT 0.84 06/05/2021 05:25 PM    CREAT 0.8 03/04/2011 11:06 AM    NA 136 06/05/2021 05:25 PM    NA 136 03/04/2011 11:06 AM    K 5.3 06/05/2021 05:25 PM    K 4.8 03/04/2011 11:06 AM    CL 97 06/05/2021 05:25 PM    CL 99 03/04/2011 11:06 AM    CO2 32 (H) 06/05/2021 05:25 PM    CO2 29 03/04/2011 11:06 AM       Reviews:  ? In-room imaging checked  ? QA Checked  ?  Reviewed treatment setup, dosimetry, dose delivery, and treatment  ? CBC Status reviewed      Plan:  ? Continue radiation  ***

## 2021-07-01 ENCOUNTER — Non-Acute Institutional Stay: Payer: MEDICARE

## 2021-07-01 DIAGNOSIS — Z931 Gastrostomy status: Secondary | ICD-10-CM

## 2021-07-01 DIAGNOSIS — C159 Malignant neoplasm of esophagus, unspecified: Secondary | ICD-10-CM

## 2021-07-01 DIAGNOSIS — K659 Peritonitis, unspecified: Secondary | ICD-10-CM

## 2021-07-01 DIAGNOSIS — E860 Dehydration: Secondary | ICD-10-CM

## 2021-07-01 NOTE — Telephone Encounter
thrusday started cipro   Kept trying to vomit   So opened tubed curtleds stuff came out  120 cc cam out and felt better.   Then slept that night   Friday kept her off the cipro.   Gave it to her sat am, gave it too her as soon as stopped the food, and it was worse, like all day with wretch   100 cc out   Yesterday what was weird put her back on cipro tab   Morning meds   Took her off food Saturday.     Yesterday morning started her back on food   Gave morning meds and cipro tablet   Some slight nausea 3 pm had massive attack of same thing gas vomiting. And had to aspirate again 60 cc   That food looked normal, like what she had put in her stomach     Kept saying she felt constipated but had nice healthy bm on Friday am and sat am   No brbpr no melena     consistency of frostys cone     Dr Tracey Harries palliative said to keep her off the food.   He was concerned their might be  A blockage .  Looking more guant Benny Lennert     Talking like she is not coming out of this like this is the end   Was supposed to start rad today     Saw pus g tube site cx   What really set her back     No fever     Cannot take her to [facility] she would not make the trip up there.     Discussed options (focus on comfort at home/transition to hospice vs ER eval for ?abd infection, dehydration) decided on [facility] for eval     Sign out given to [facility] ER MD   Diagnoses and all orders for this visit:    Dehydration  -     Referral to Hospitalist, Adult    Gastrointestinal tube present (HCC/RAF)  -     Referral to Hospitalist, Adult    Malignant neoplasm of esophagus, unspecified location (HCC/RAF)  -     Referral to Hospitalist, Adult    Abdominal infection (HCC/RAF)  -     Referral to Hospitalist, Adult

## 2021-07-02 ENCOUNTER — Non-Acute Institutional Stay: Payer: MEDICARE

## 2021-07-02 ENCOUNTER — Inpatient Hospital Stay: Payer: MEDICARE | Attending: Radiation Oncology

## 2021-07-03 ENCOUNTER — Non-Acute Institutional Stay: Payer: MEDICARE

## 2021-07-03 ENCOUNTER — Inpatient Hospital Stay: Payer: MEDICARE | Attending: Radiation Oncology

## 2021-07-04 ENCOUNTER — Telehealth: Payer: MEDICARE

## 2021-07-04 ENCOUNTER — Ambulatory Visit: Payer: MEDICARE

## 2021-07-04 ENCOUNTER — Non-Acute Institutional Stay: Payer: MEDICARE

## 2021-07-04 ENCOUNTER — Non-Acute Institutional Stay: Payer: MEDICARE | Attending: Neurology

## 2021-07-04 NOTE — Telephone Encounter
Message to Practice/Provider      Message:   Mardene Celeste from [facility] calling on behalf of Dr. Ozzie Hoyle  Requesting Medical records from last 3 months/clinical notes for pt.   Ph: [phone]  Fx: [phone]    Return call is not being requested by the patient or caller.    Patient or caller has been notified of the turnaround time of 1-2 business day(s).

## 2021-07-04 NOTE — Telephone Encounter
Faxed recent note, informed Erika Kline that she must contact medical records to obtain all records from last 3 months.

## 2021-07-05 ENCOUNTER — Telehealth: Payer: MEDICARE

## 2021-07-05 ENCOUNTER — Non-Acute Institutional Stay: Payer: MEDICARE

## 2021-07-05 ENCOUNTER — Ambulatory Visit: Payer: MEDICARE

## 2021-07-05 DIAGNOSIS — G893 Neoplasm related pain (acute) (chronic): Secondary | ICD-10-CM

## 2021-07-05 DIAGNOSIS — C159 Malignant neoplasm of esophagus, unspecified: Secondary | ICD-10-CM

## 2021-07-05 MED ORDER — OXYCODONE HCL 5 MG/5ML PO SOLN
5 mg | Freq: Four times a day (QID) | GASTROSTOMY | 0 refills | Status: AC | PRN
Start: 2021-07-05 — End: ?

## 2021-07-05 NOTE — Progress Notes
GERIATRICS OUTPATIENT PROGRESS NOTE    PATIENT: Erika Kline  MRN: [id number]  DOB: [birth year]  DATE OF SERVICE: 07/05/2021    PRIMARY CARE PROVIDER: Evaristo Bury., MD      CHIEF COMPLAINT  No chief complaint on file.     eval anxiety abd pain     HISTORY OF PRESENT ILLNESS  Erika Kline is a 88 y.o.      Oncologist Dr. Jerelyn Charles   Rad onc Dr. Janeth Rase     Here with dtrs laural and casey    Last seen by me  Sent to eD 1/30     I sent an update to Dr. Pollyann Kennedy as well regarding Mom's recent visit to the ER on Monday and her CT Scan results.  The ER doc said she was backed up and needed to take Miralax twice daily, more water daily and that the CT Scan showed some anomalies that you and Dr. Pollyann Kennedy should review but I don't see anything that we aren't already aware of. I've attached the results for your review. We also want to speak with you about the Palliative care NP.  Thanks  --    Today is feeling not that great   Since the ER having a lot more     Gave her miralax bid x last 2 days   yesterdfya just once b/c day beore that had up to 9 bms, small to medium in size. But they were decent    And seems liket things were moving   [name] the nurse came yesterday   Delrae Sawyers it to her once a day, then if she needs can go back up to bid     Gave it to her this morning.   Has not had a bm yet today.     - yesterday just small bm       If gave meds w/ water would have  Bouts of nausea feels sick to stomach, then does dry heaves. And burping and belching, she does not feel like the air in her stomach is getting out. '  Occurring more frequently   Got her up to use the bathroom and that caused an attack.     Had 5-6 cups water yesterdya   Last gave her at 7pm that caused attack, then     Is getting tube feeds since brought her back from the ER   4-5 cans yesterday   Took her off a few hrs this morning.     On the cipro tablets bid     Checked tube this morning no inflammation or redness.       Has 5 days left  Of cipro  (total 14 days)   ''pseudomonas'' from around the g tubee - discharge   No g tube still a little bit of blood /discharge after violent gas attacks     Yesterday was rough   abd is mildly tender in lower abd when they palpate     When gas gets bad gets pain near g tube ares   ,lower abd pain both sharp and dulle, more sharp     Most of the pain after the first major attack from the cipro     fentynl patch inc from 37.5 - has not gotten yt -- NP from palliative changed t he patch   Also has morphien was told to use for breakthrough pain     Asked to replaced.   Needs oxy refilled.  Labs from 07/01/21    Most recent to oldest [Reference Range]: 1   eGFR 64   *NA*  (07/01/21 1:18 PM)    Lymphocytes Absolute [0.7-5.0 x10e3/mcL] 1.6 x10e3/mcL   (07/01/21 1:18 PM)    Monocytes Absolute [0.1-1.1 x10e3/mcL] 0.9 x10e3/mcL   (07/01/21 1:18 PM)    Eosinophils Absolute [0.0-0.7 x10e3/mcL] 0.1 x10e3/mcL   (07/01/21 1:18 PM)    Basophils Absolute [0.0-0.2 x10e3/mcL] 0.1 x10e3/mcL   (07/01/21 1:18 PM)    Anion Gap [3-11] 10   (07/01/21 1:18 PM)    Neutrophils Absolute [1.8-8.4 x10e3/mcL] 3.7 x10e3/mcL   (07/01/21 1:18 PM)    Globulin [1.0-5.0 gm/dL] 4.1 gm/dL   (5/36/64 4:03 PM)    Albumin [3.5-5.2 gm/dL] 3.3 gm/dL   *LOW*  (4/74/25 9:56 PM)    Alkaline Phosphatase [40-150 units/L] 76 units/L   (07/01/21 1:18 PM)    ALT [0-55 units/L] 18 units/L   (07/01/21 1:18 PM)    AST [5-34 units/L] 33 units/L   (07/01/21 1:18 PM)    Basophils [0.0-2.0 %] 1.2 %   (07/01/21 1:18 PM)    Bilirubin Direct [0.0-0.5 mg/dL] 0.3 mg/dL   (3/87/56 4:33 PM)    Bilirubin Total [0.2-1.2 mg/dL] 0.8 mg/dL   (2/95/18 8:41 PM)    BUN [7-20 mg/dL] 15 mg/dL   (6/60/63 0:16 PM)    Calcium [8.4-10.2 mg/dL] 9.7 mg/dL   (0/10/93 2:35 PM)    Chloride [98-107 mmol/L] 96 mmol/L   *LOW*  (07/01/21 1:18 PM)    CO2 [22-29 mmol/L] 30 mmol/L   *High*  (07/01/21 1:18 PM)    Creatinine [0.55-1.02 mg/dL] 5.73 mg/dL   (07/23/23 4:27 PM)    Eosinophils [0.0-6.0 %] 1.5 %   (07/01/21 1:18 PM) Glucose [70-99 mg/dL] 81 mg/dL   (0/62/37 6:28 PM)    Hematocrit [37.0-47.0 %] 41.3 %   (07/01/21 1:18 PM)    Hemoglobin [12.0-16.0 gm/dL] 31.5 gm/dL   (1/76/16 0:73 PM)    Potassium [3.5-5.1 mmol/L] 4.5 mmol/L   (07/01/21 1:18 PM)    Lactic Acid [0.7-2.0 mmol/L] 1.4 mmol/L   (07/01/21 1:32 PM)    Lipase [<=60 units/L] 13 units/L   (07/01/21 1:18 PM)    Lymphocytes [20.0-45.0 %] 25.1 %   (07/01/21 1:18 PM)    MCH [26.0-34.0 pg] 32.5 pg   (07/01/21 1:18 PM)    MCHC [31.0-36.5 gm/dL] 71.0 gm/dL   (11/25/92 8:54 PM)    MCV [80.0-99.0 fL] 94.7 fL   (07/01/21 1:18 PM)    Monocytes [2.0-10.0 %] 13.6 %   *High*  (07/01/21 1:18 PM)    MPV [5.1-10.0 fL] 8.6 fL   (07/01/21 1:18 PM)    Sodium [627-035 mmol/L] 136 mmol/L   (07/01/21 1:18 PM)    Neutrophils [50.0-75.0 %] 58.6 %   (07/01/21 1:18 PM)    Platelets x 10*3 [140-440 /CMM] 229 /CMM   (07/01/21 1:18 PM)    RBC x 10*6 [4.20-5.40 /CMM] 4.35 /CMM   (07/01/21 1:18 PM)    RDW [11.0-15.5 %] 13.7 %   (07/01/21 1:18 PM)    Total Protein [6.4-8.3 gm/dL] 7.4 gm/dL   (0/09/38 1:82 PM)    WBC x 10*3 [3.6-11.2 /CMM] 6.3 /CMM   (07/01/21 1:18 PM)          CHRONIC CONDITIONS  1.  []  Improved  []  Worsened  []  Stable   2.  []  Improved  []  Worsened  []  Stable   3.   []  Improved  []  Worsened  []  Stable  Past Medical History:   Diagnosis Date   ? Allergy, unspecified not elsewhere classified    ? Arthritis    ? Basal cell carcinoma    ? Cancer (HCC/RAF)     Stomach Cancer S/P chemo   ? Depression    ? Gastric lymphoma (HCC/RAF)    ? Gastric lymphoma (HCC/RAF)    ? Gastric lymphoma (HCC/RAF)    ? Hyperlipidemia    ? Hypertension      Past Surgical History:   Procedure Laterality Date   ? breast implants  1978   ? BREAST SURGERY       Family History   Problem Relation Age of Onset   ? Heart attack Father    ? Cancer Sister          MEDICATIONS  No outpatient medications have been marked as taking for the 07/05/21 encounter (Telemedicine) with Evaristo Bury., MD.     Outpatient Medications Prior to Visit Medication Sig   ? ALPRAZolam 0.25 mg tablet Take 1 tablet (0.25 mg total) by mouth daily as needed for Anxiety. Max Daily Amount: 0.25 mg   ? apixaban 2.5 mg tablet Take 1 tablet (2.5 mg total) by mouth two (2) times daily.   ? famotidine 40 mg/5 mL suspension Take 5 mLs (40 mg total) by mouth two (2) times daily.   ? fentaNYL 12 mcg/hr patch Place 1 patch onto the skin every three (3) days. Max Daily Amount: 1 patch   ? glycopyrrolate (ROBINUL) 1 mg tablet 1 tablet (1 mg total) by Per G Tube route three (3) times daily as needed.   ? metoprolol tartrate 25 mg tablet Give one-half tablets (12.5 mg total) by Per G Tube route two (2) times daily.   ? ondansetron 4 mg/32mL solution Take 5 mLs (4 mg total) by mouth every eight (8) hours as needed for Nausea or Vomiting.   ? pantoprazole 2 mg/mL oral suspension Take 20 mLs (40 mg total) by mouth two (2) times daily.   ? sertraline 20 mg/mL solution Take 1.3 mLs (25 mg total) by mouth daily.   ? White Petrolatum-Mineral Oil (ARTIFICIAL TEARS) 83-15% ophthalmic ointment Place into both eyes four (4) times daily.   ? oxyCODONE 5 mg/5 mL solution Give 5 mLs (5 mg total) by Per G Tube route every six (6) hours as needed. Max Daily Amount: 20 mg     No facility-administered medications prior to visit.           PERTINENT SOCIAL HISTORY  Social History     Socioeconomic History   ? Marital status: Widowed   Tobacco Use   ? Smoking status: Never   Vaping Use   ? Vaping Use: Never used   Substance and Sexual Activity   ? Alcohol use: No   ? Drug use: No   ? Sexual activity: Never   Other Topics Concern   ? Need Help Feeding Yourself? No   ? Need Help Getting from Bed to Chair? No   ? Need Help Getting to the Toilet? No   ? Need Help Getting Dressed? No   ? Need Help Bathing or Showering? No   ? Need Help Walking Across the Road? (Includes [place]) No   ? Need Help Using the Telephone? No   ? Need Help Taking your Medications? No   ? Need Help Preparing Meals? No   ? 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 Need  Help Doing Moderately Strenuous Housework? (ex. Laundry) No   ? Need Help Shopping for Personal Items? (Toiletries, Medicines) No   ? Need Help Shopping for Groceries? No   ? Need Help Driving? No   ? Need Help Climbing a Flight of Stairs? No   ? Need Help Getting Places Beyond Walking Distance? (Bus, Taxi) No   ? Do you live with someone who assists you at home? No   ? Do you get help from family members or friends in your home? No   ? Do you employ someone to provide health related care or help you in your home? No   ? Do you provide care for a family member? No   ? Does your home have rugs in the hallway? No   ? Does your home have poor lighting? No   ? Does your home lack grab bars in the bathroom? No   ? Does your home lack handrails on the stairs? No   ? Have you noticed any hearing difficulties? No   ? Do you currently participate in any regular activity to improve or maintain your physical fitness? No   ? Do you always wear a seatbelt when you ride in a car? No   ? If you drink alcohol, do you drink more than 7 drinks per week or more than 3 drinks on any given day? No   ? Has anyone ever been concerned about your drinking? No   Social History Narrative    Lives w/ daughter           REVIEW OF SYSTEMS   (Check if unchanged, or note how changed)     [x]  Constitutional  []  Mental Status  []  Mood    []  Eyes  []  ENT  []  Cardiovascular    [x]  Resp  []  GI  []  GU    []  Neuro  []  Endo  []  Heme/Lymph    []  Musculoskeletal   []  Gait/balance  []  Cognition    []  Allergy/Immune  []  Skin             PHYSICAL EXAM (Check if Normal, or note positive findings)  VITALS  There were no vitals taken for this visit.   BP Readings from Last 3 Encounters:   06/13/21 123/73   06/05/21 99/65   05/29/21 142/85     Wt Readings from Last 3 Encounters:   06/13/21 103 lb (46.7 kg)   06/05/21 102 lb 12.8 oz (46.6 kg)   05/12/21 100 lb 8 oz (45.6 kg)        Gen  [x]  NAD GI [x]  Abd       []  Rectal  []  Organomegaly      [x]  Masses [x]  Guard/Rebnd Tend        Eyes  [x]  Conj/Lids  [x]  Pupils  []  Fundi GU  Fem:   []  Ext []  Vagina []   Cervix   []   Uterus/Adnexa         Female:   []  Penis []  Scrotum []  Prost     ENT  []  Ears/otosc  []  Nasal Muc  [x]  Oroph  []  Hearing   Neuro  []  A/O []  CN2-12  []  DTR/bbski    Neck  [x]  Inspect/Palp  []  Thyroid     []  motor/sens   Breast  []  Inspect  []  Palpation   MS  []  Gait       []  Insp/palp     []  Tone      []   ROM   Resp  [x]  Effort  [x]  Auscultate  []  Percuss     []  Balance []  Back   CV  [x]  Auscultate  []  Carotids  PULSES: []  Pedal  [] Fem        [x]  Edema   Skin  [x]  Inspect  [x]  Palp   Lymph  [x]  Neck  []  Axillary  []   Femoral   Psych  []  Insight/judgment     [x]  Affect      []  Cognition     Details of positive findings:  Siting in WC   g tube in place, peritube granulation tissue, minimal erythema no e/o pus   Cachectic  No edema   Lungs ctab   SK behind L knee   Nodule R wrist     - no exam, telephone visit                   RECENT LABS (reviewed by me)   Office Visit on 06/05/2021   Component Date Value Ref Range Status   ? TSH 06/05/2021 2.3  0.3 - 4.7 mcIU/mL Final    TSH is normal, no further Thyroid tests were performed.    If applicable TSH pregnancy reference intervals:   First trimester (10-[redacted] weeks gestation): 0.03- 4.0 mcIU/mL  Second trimester (14-[redacted] weeks gestation): 0.19- 4.0 mcIU/mL     ? Sodium 06/05/2021 136  135 - 146 mmol/L Final   ? Potassium 06/05/2021 5.3  3.6 - 5.3 mmol/L Final   ? Chloride 06/05/2021 97  96 - 106 mmol/L Final   ? Total CO2 06/05/2021 32 (H)  20 - 30 mmol/L Final   ? Anion Gap 06/05/2021 7 (L)  8 - 19 mmol/L Final   ? Glucose 06/05/2021 97  65 - 99 mg/dL Final   ? Creatinine 29/56/2130 0.84  0.60 - 1.30 mg/dL Final   ? Estimated GFR 06/05/2021 68  See GFR Additional Information mL/min/1.25m2 Final   ? GFR Additional Information 06/05/2021 See Comment   Final    GFR >89.........Marland KitchenNormal   GFR 60 - 89...Marland KitchenMarland KitchenNormal to mildly decreased   GFR 45 - 59...Marland KitchenMarland KitchenMildly to moderately decreased   GFR 30 - 44...Marland KitchenMarland KitchenModerately to severely decreased   GFR 15 - 29...Marland KitchenMarland KitchenSeverely decreased   GFR <15.........Marland KitchenKidney failure   The 2021 CKD-EPI creatinine equation was used to calculate the estimated GFR and assumes stable creatinine concentrations.   Results are in mL/min/1.73 square meters.  The patient's eGFR MAY need to be adjusted for drug dosing.   For drug dosing, utilize eGFR or eCrCl.   If using the eGFR in very large or very small patients, then multiply the reported eGFR by the estimated BSA and divide by 1.73 m2, in order to obtain eGFR in units of mL/min.   ? Urea Nitrogen 06/05/2021 25 (H)  7 - 22 mg/dL Final   ? Calcium 86/57/8469 9.6  8.6 - 10.4 mg/dL Final   ? Total Protein 06/05/2021 7.1  6.1 - 8.2 g/dL Final   ? Albumin 62/95/2841 3.5 (L)  3.9 - 5.0 g/dL Final   ? Bilirubin,Total 06/05/2021 0.4  0.1 - 1.2 mg/dL Final   ? Alkaline Phosphatase 06/05/2021 129 (H)  37 - 113 U/L Final   ? Aspartate Aminotransferase 06/05/2021 36  13 - 62 U/L Final    NOTE: New Reference Range.     ? Alanine Aminotransferase 06/05/2021 24  8 - 70 U/L Final    NOTE: New Reference Range.       ?  White Blood Cell Count 06/05/2021 7.52  4.16 - 9.95 x10E3/uL Final   ? Red Blood Cell Count 06/05/2021 4.23  3.96 - 5.09 x10E6/uL Final   ? Hemoglobin 06/05/2021 13.8  11.6 - 15.2 g/dL Final   ? Hematocrit 96/09/5407 42.2  34.9 - 45.2 % Final   ? Mean Corpuscular Volume 06/05/2021 99.8 (H)  79.3 - 98.6 fL Final   ? Mean Corpuscular Hemoglobin 06/05/2021 32.6  26.4 - 33.4 pg Final   ? MCH Concentration 06/05/2021 32.7  31.5 - 35.5 g/dL Final   ? Red Cell Distribution Width-SD 06/05/2021 55.7 (H)  36.9 - 48.3 fL Final   ? Red Cell Distribution Width-CV 06/05/2021 15.0  11.1 - 15.5 % Final   ? Platelet Count, Auto 06/05/2021 268  143 - 398 x10E3/uL Final   ? Mean Platelet Volume 06/05/2021 10.8  9.3 - 13.0 fL Final   ? Nucleated RBC%, automated 06/05/2021 0.0  No Ref. Range % Final    Percent Reference Range Not Reported per accrediting agency   ? Absolute Nucleated RBC Count 06/05/2021 0.00  0.00 - 0.00 x10E3/uL Final   ? Neutrophil Abs (Prelim) 06/05/2021 4.13  See Absolute Neut Ct. x10E3/uL Final    This is a preliminary result.  If automated differential see Absolute Neut  Count or if manual differential see Absolute Neut Ct, Manual for final result.   ? Neutrophil Percent, Auto 06/05/2021 54.8  No Ref. Range % Final    Percent reference range not reported per accrediting agency.     ? Lymphocyte Percent, Auto 06/05/2021 29.7  No Ref. Range % Final    Percent reference range not reported per accrediting agency.     ? Monocyte Percent, Auto 06/05/2021 11.4  No Ref. Range % Final    Percent reference range not reported per accrediting agency.     ? Eosinophil Percent, Auto 06/05/2021 2.7  No Ref. Range % Final    Percent reference range not reported per accrediting agency.     ? Basophil Percent, Auto 06/05/2021 1.1  No Ref. Range % Final    Percent reference range not reported per accrediting agency.     ? Immature Granulocytes% 06/05/2021 0.3  No Reference Range % Final    Percent reference range not reported per accrediting agency.     ? Absolute Neut Count 06/05/2021 4.13  1.80 - 6.90 x10E3/uL Final   ? Absolute Lymphocyte Count 06/05/2021 2.23  1.30 - 3.40 x10E3/uL Final   ? Absolute Mono Count 06/05/2021 0.86 (H)  0.20 - 0.80 x10E3/uL Final   ? Absolute Eos Count 06/05/2021 0.20  0.00 - 0.50 x10E3/uL Final   ? Absolute Baso Count 06/05/2021 0.08  0.00 - 0.10 x10E3/uL Final   ? Absolute Immature Gran Count 06/05/2021 0.02  0.00 - 0.04 x10E3/uL Final          STUDIES (reviewed by me)         ASSESSMENT AND PLAN        Cancer related pain  -     oxyCODONE 5 mg/5 mL solution; Give 5 mLs (5 mg total) by Per G Tube route every six (6) hours as needed. Max Daily Amount: 20 mg  - discusse dw/ pt, dtrs, onc, plan for hospice eval.   bil dil noted w/ normal lft /bili   Ongoing dry heaving abd pain episodes, unclear if ca related bu tmore likley given now having bs but ongoing pain. Appreciate hospice c/s  Excessive oral secretions  -     glycopyrrolate (ROBINUL) 1 mg tablet; 1 tablet (1 mg total) by Per G Tube route three (3) times daily as needed.  Discussed se     Anxiety  -     ALPRAZolam 0.25 mg tablet; Take 1 tablet (0.25 mg total) by mouth daily as needed for Anxiety. Max Daily Amount: 0.25 mg  See below         Malignant neoplasm of esophagus, unspecified location (HCC/RAF)  - planning radiation therapy with Dr. Guilford Shi   -     fentaNYL 12 mcg/hr patch; Place 1 patch onto the skin every three (3) days. Max Daily Amount: 1 patch  - followed by dr Nedra Hai rosen     Gastrointestinal tube present (HCC/RAF    -     Home Health: Social Work; Future  -     Referral to Clinical Nutrition - Medical Nutrition Counseling including Weight Loss    Gait disorder  cw hh pt     Gastroesophageal reflux disease, unspecified whether esophagitis present  -     famotidine 40 mg/5 mL suspension; Take 5 mLs (40 mg total) by mouth two (2) times daily.  -     pantoprazole 2 mg/mL oral suspension; Take 20 mLs (40 mg total) by mouth two (2) times daily.    Atrial fibrillation, unspecified type (HCC/RAF)  -     metoprolol tartrate 25 mg tablet; Give one-half tablets (12.5 mg total) by Per G Tube route two (2) times daily.  -     apixaban 2.5 mg tablet; Take 1 tablet (2.5 mg total) by mouth two (2) times daily.  -     TSH with reflex FT4, FT3; Future  - was on amio and lasix in the past hold for now, will see cards     Anxiety/MDD   MDD (major depressive disorder), recurrent episode, moderate (HCC/RAF)  -     sertraline 20 mg/mL solution; Take 1.3 mLs (25 mg total) by mouth daily.-- will see if tolerates, can increase to 50 mg weekly  - discussed r/b of benzo. Discussed se, ok trial xanax start w/ 1/2 tab prn     Skin lesion  -     Referral to Dermatology    Drug-induced constipation  - c/w prunes   - consider miralax...     Severe protein-calorie malnutrition (HCC/RAF)  - will have nutrition assessment re tube feeds    Nonrheumatic mitral valve regurgitation  S/P mitral valve clip implantation  - chronic stable     Cancer related pain  -     oxyCODONE 5 mg/5 mL solution; Give 5 mLs (5 mg total) by Per G Tube route every six (6) hours as needed. Max Daily Amount: 20 mg  -     fentaNYL 12 mcg/hr patch; Place 1 patch onto the skin every three (3) days. Max Daily Amount: 1 patch    Hyperlipidemia, unspecified hyperlipidemia type  - off atorva     Excessive oral secretions  -     glycopyrrolate (ROBINUL) 1 mg tablet; Give 1 tablet (1 mg total) by Per G Tube route at bedtime.    Nausea and vomiting, unspecified vomiting type  -     ondansetron 4 mg/21mL solution; Take 5 mLs (4 mg total) by mouth every eight (8) hours as needed for Nausea or Vomiting.       Esophageal Squamous cell carcinoma   Gastric lymphoma in remission since 2005  Pafib -  apix and mtp  Non rheumatic MR sp mitraclip   HLD   Depression   Severe malnutrition   Hx of covid   Cancer related pain - fentanyl 12 mcg q 72 hrs, oxycodone 5mg  q 6 hrs prn glycopyrrolate 1mg  at bedtime for secreations   GERD  - pantoprazoe, famotidine       Follow up next week       Per H.R. 6074, Division B, Section 101 and 102, the Coronavirus Preparedness and Response Supplemental Appropriations Act of 2020, this visit was conducted via audio telecommunication with the patient?s verbal consent after verifying their name and date of birth. Vitals were not taken during this telemedicine appointment. The duration of the telemedicine visit was 23  minutes, to include but not limited to review of medical records and care coordination.    FOLLOW UP  No follow-ups on file.       Author    Makeshia Seat B. Huel Cote 07/05/2021     The above plan of care, diagnosis, orders, and follow-up were discussed with the patient.  Questions related to this recommended plan of care were answered.    Patient Instructions   Agree miralax daily , with twice a day as needed.   Ok use suppository today   Ok to stop cipro after 10 days.

## 2021-07-05 NOTE — Patient Instructions
Agree miralax daily , with twice a day as needed.   Ok use suppository today

## 2021-07-06 NOTE — Patient Instructions
RADIATION THERAPY - PATIENT EDUCATION      General Instructions:      Many side effects including fatigue, pain/skin reaction on the treatment site, and nausea/vomiting, which you may experience during your treatment, should slowly improve a few weeks after completion of treatment.   You may continue activity and diet as tolerated, unless otherwise noted.   We have an in-house Registered Dietician and Child psychotherapist available if needed and/or upon request.   Please talk to your Radiation Oncologist about taking supplements/vitamins during your treatment. Antioxidants are discouraged during radiation treatment.   You may shower, but do not scrub the treated area. Avoid sun exposure to the treated area and use sunscreen. Your skin in the treatment area will be more sensitive to the sun, up to a year after treatment is completed.    Use mild soaps until skin is completely healed.   In addition to chest pain, please go to the nearest emergency room if you are experiencing increase shortness of breath, passing out/ fainting, unusual or bad headache, sudden inability to speak, see, walk or move, sudden weakness or drooping, dizziness that does not go away, sudden confusion, heavy bleeding, excessive vomiting or diarrhea, severe pain, high fever or seizures.     Site Specific Symptoms: ESOPHAGUS     You may experience the following common symptoms during and/or after completion of treatment:     Fatigue    Pressure or discomfort in area of radiation   Skin changes (peeling, blistering, itching, or redness)   Esophagitis (irritation/inflammation of the esophagus)   Nausea/Vomiting       Radiation Therapy: Managing Short-Term Side Effects   Radiation therapy uses high-energy X-rays or particles to kill cancer cells. Some normal cells can also be affected. This causes side effects such as dry skin, tiredness (fatigue), or changes in your appetite. Most side effects go away when your radiation therapy is over but it may take 4 to 6 weeks to improve.   Having side effects of radiation therapy does not mean that your cancer is getting worse or that therapy isn?t working.     Caring for your skin  Skin problems may happen where your body gets radiation. Your skin may become dry, itchy, red, and start peeling. It may darken in that spot, like a tan. To care for your skin:   Don?t scrub on the treatment area. Clean that area of the skin every day. Use warm water and mild soap, or as your healthcare provider advises. Pat the skin afterward or let it air dry.  Ask your therapy team what lotion to use and when to use it. And let them know if you are using any kind of lotion or moisturizers.  Keep the treated area out of direct sunlight. Hats, protective clothing, and sunscreen are very helpful.  Don't remove ink marks unless your radiation therapist says you can. Don?t scrub the marks when you wash. Let water run over them and pat them dry.  Protect your skin from heat or cold. Don't use hot tubs, saunas, hot pads, or ice packs.  Wear soft, loose clothing to keep skin from rubbing.    Fighting tiredness  The cancer itself or the radiation therapy may cause you to feel tired. Your body is working hard to heal and repair itself. To feel better:   Try light exercise each day. Take short walks.  Plan tasks for the times when you tend to have the most energy. Ask for help  when you need it.  Relax before you go to bed to sleep better. Try reading or listening to soothing music.  Let your cancer care team know if you continue to have fatigue that is not getting better. They may be able to offer ways to help.     Coping with appetite changes  Tell your therapy team if you find it hard to eat or have no appetite. You may need to see a nutritionist. This is a healthcare provider with special training in meal planning. To keep your strength up, you need to eat well and maintain your weight. Think of healthy eating as part of your treatment. Try these tips: Eat slowly.  Eat small meals several times a day.  Eat more food when you?re feeling better, even if it is not mealtime.  Ask others to keep you company when you eat.  Stock up on easy-to-prepare foods.  Eat foods high in protein and calories.  Drink plenty of water and other fluids.  Ask your healthcare provider before taking any vitamins.    Site-specific side effects  These side effects include the following:   You may lose hair in the area being treated. The hair may not grow back after treatment.  Your mouth or throat can become dry or sore if your head or neck is being treated. Sip cool water to help ease discomfort.  Tell your healthcare provider if you have nausea, diarrhea, or constipation. You may need to take medicine or follow a special diet.    Talk with your healthcare team  Radiation therapy can also have other side effects, including some that might not show up until years later. Talk with your healthcare team about what to expect with the type of radiation therapy you are getting, including when you should call them with concerns.      Supplements To Avoid During Treatment  Radiation works in part by creating free radicals -- highly energized molecules that damage cancer cells, free radicals environment can damage all cells but in the case of radiation treatment they are focused on the cancer cells. Antioxidants help prevent or neutralize free radicals.  Due to the potential conflict between the goal of radiation therapy (to make free radicals to kill cancerous cells) and the goal of antioxidants (to neutralize free radicals), it is recommended that patients stop taking any antioxidant supplements during radiation therapy, as they can reduce the effectiveness of your treatment. When radiation is finished, you can resume taking your supplements.  Throughout your treatment, do your best to eat a well-balanced diet of whole fruits, vegetables, and whole grains that contains all of the nutrients you need. Vitamins and minerals that come naturally from food are safe and not likely to interfere with treatment as long as they are consumed in reasonable doses.   During the course of your radiation therapy, avoid these supplements (pills):  Vitamin A  Vitamin C/Emergen-C/Airborne  Vitamin E  Selenium  CoQ10  Turmeric/Curcumin  Ginger  Lycopene  Green Tea Capsules   To contact our Dietitian/Nutritionist on site, please call:    Debarah Crape, MS, RDN  Registered Dietitian, Department Of Radiation Oncology  [facility]  [address]  [place], [place] [address]  [phone] (cell), [phone] (office), fax [phone]  [email or web address]     To contact our Child psychotherapist on site, please call:    Hulen Shouts, MSW, LCSW  Clinical Social Worker III  Radiation Oncology  [address]  [address] ~ [address]  [place], [place] [address]  Phone: [phone] ~ Fax: [phone]      Remember the symptoms or side effects mentioned above are usually temporary and begin to resolve a few weeks after treatment ends. It is advisable to continue skin care regimen as directed by your Radiation Oncologist and Radiation Oncology Nurses. If needed, there are medications that can be prescribed to make you more comfortable, so please do not hesitate to reach out to your Radiation Oncology team if you start to experience any of these symptoms.      If the symptoms or side effects mentioned above do not resolve over time or you notice any new symptoms, such as heavy bleeding, a fever or flu-like symptoms, please let us know by calling [facility] Radiation Oncology Mondays-Fridays 8 a.m. - 5 p.m.  [facility] at [phone]   [facility] at [phone]  [facility] at [phone]    Call  [phone] (after hours and weekends, ask for Radiation Oncologist on-call) and go to the Emergency Department for further evaluation.?     Nursing station: [place] [phone]; [address] [phone]; Sherlon Handing [phone]

## 2021-07-08 ENCOUNTER — Non-Acute Institutional Stay: Payer: MEDICARE | Attending: Radiation Oncology

## 2021-07-08 ENCOUNTER — Non-Acute Institutional Stay: Payer: MEDICARE

## 2021-07-08 ENCOUNTER — Ambulatory Visit: Payer: MEDICARE

## 2021-07-08 ENCOUNTER — Telehealth: Payer: MEDICARE

## 2021-07-09 ENCOUNTER — Inpatient Hospital Stay: Payer: MEDICARE

## 2021-07-09 ENCOUNTER — Non-Acute Institutional Stay: Payer: MEDICARE

## 2021-07-09 ENCOUNTER — Non-Acute Institutional Stay: Payer: MEDICARE | Attending: Radiation Oncology

## 2021-07-09 NOTE — Consults
Patient Consent to Telehealth   The patient agreed to participate in the video visit prior to joining the visit.      VIDEO VISIT AT PT REQUEST--75 mon incl prep time  PATIENT: Erika Kline  MRN: [id number]  DOB: 07-29-1933  DATE OF SERVICE: 06/07/2021  REFERRING PRACTITIONER: No ref. provider found  PRIMARY CARE PROVIDER: Evaristo Bury., MD    Subjective:     Erika Kline is a 88 y.o. female with squamous cell carcinoma of the esophagus.  Here for follow up visit, symptom management, and treatment planning.    Made it back home after [facility] hospitalization.  Weak, but slowly stronger.  ROS as below.  Denies CP or SOB.  Denies fevers.    Oncologic History:    106/2022  CT chest (done due to SOB/desat with mild exertion): small pulm nodules, indeterminate    11/4-25/2022  Admitted [facility] for nausea/vomiting/weight loss/dysphagia.  Hospitalization.  CT CAP: periph enhancing lesion in mid esoph (1.8cm), unchanged pulm nodules, no findings in the abd/pelvis.  EGD mass at 25 cm, partially obstructing.  Path: mod diff squamous cell carcinoma (no molec workup done but our eval showed PDL1 CPS 5; rest of [facility] is pending).  PEG tube placed.  Few days later, new 3.5 cm fluid collection at g tube insertion site---aspirated/treated with antibiotics.  Sent to SNF for recovery.    12/9-15/2022  Admitted [facility]. CT CAP: Slightly enlarged esoph mass from outside film of Nov.  Circumferential wall invasion. No overt evidence of met disease but some indeterminate nodes. Rad onc consulted.      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  PAF?on eliquis, CKD, chronic diastolic dysfunction, nonrheumatic MR sp mitraclip    Review of Systems:    Constitutional: No fevers, chills. Fatigue.  Gaining weight.  Ambulatory and using walker.    Eyes: No recent blurry vision, double vision or visual changes.  ENT: No recent sinus pain or congestion. No sore throat or mouth sores.   Neck: No neck pain or stiffness.  Cardiovascular: No chest pain or pressure, no palpitations.   Pulmonary: Shortness of breath, no cough or wheezing.   Gastrointestinal: Occ abdominal pain, nausea, no vomiting.  Min diarrhea.  No melena or BRBPR.   Genitourinary: No urinary frequency, urgency, or dysuria.   Musculoskeletal: No joint pain, muscle pain or back pain.   Neurologic: No headaches. No numbness, tingling or weakness.   Dermatologic: No rash, pruritus or recent skin changes.   Hematologic: No abnormal bruising or bleeding.   Endocrine: No polyuria, polydipsia, heat or cold intolerance.   Psychiatric: No depressive symptoms, no suicidal or homicidal ideation    Objective:     ECOG performance status: 2-3.    No physical exam as this was a video visit      Assessment/Plan:     Assessment: Erika Kline is a 88 y.o. female with squamous cell carcinoma of the esophagus.  Here for follow up visit, symptom management, and treatment planning.    Plan:  Squamous Cell carcinoma of the esophagus (PDL1 CPS 5) :  Discussed treatment options including observation alone vs standard therapy vs clinical trial (not a candidate).  There are many ways to treat this tumor successfully, but her perf status is extremely poor.  On the other hand, if we can improve nutrition and functional status, she may be a great candidate for active therapy.  Explained that radiation (short course as planned by Dr  Raldow) remains a viable option.  Whether or not she can withstand the daily treatments much less the addition of chemotherapy remains to be seen.  For now, she will follow with Dr Carleene Cooper and come to see [name] if/when she can.    Discussed side effects and symptom management.  Continue with home health nursing team as additional support at home.  Continue to optimize supportive care.    Malnutrition/Dysphagia:  G-tube continuous feedings.  Strict NPO.  Jevity 65 ml/hour.  Followed closely by dietician and [facility] nursing.  Follow expectantly.    We discussed today extensively this patient's complicated medical and oncologic history with other physicians and allied health professionals involved in the care.   Roxanne Gates, MD FACP

## 2021-07-10 ENCOUNTER — Inpatient Hospital Stay: Payer: MEDICARE | Attending: Radiation Oncology

## 2021-07-10 ENCOUNTER — Non-Acute Institutional Stay: Payer: MEDICARE

## 2021-07-10 ENCOUNTER — Telehealth: Payer: MEDICARE

## 2021-07-10 NOTE — Telephone Encounter
Appointment Accommodation Request      Appointment Type:  New Pt   Reason for sooner request:PEr pt daughter   Pt palative care nurse, mention if pt can have an emergency visiti with dr. Abbott Pao has had issuses since being discharged since February and has had 2 infections in g tube. And it is currently red around the gause. Pt was on zipro for fourteen days. A lot of issues with g tube are. Pt has a fairly new g tube     Date/Time Requested (If any): asap    Last seen by MD:   New pt   Any Symptoms:  [x]  Yes  []  No       If yes, what symptoms are you experiencing:   o Duration of symptoms (how long):     Patient or caller was offered an appointment but declined.    Patient or caller was advised to seek emergency services if conditions are urgent or emergent.    Patient or caller has been notified of the turnaround time of 1-2 business (days).

## 2021-07-11 ENCOUNTER — Non-Acute Institutional Stay: Payer: MEDICARE

## 2021-07-11 ENCOUNTER — Telehealth: Payer: MEDICARE

## 2021-07-11 ENCOUNTER — Non-Acute Institutional Stay: Payer: MEDICARE | Attending: Neurology

## 2021-07-11 MED ORDER — GLYCOPYRROLATE 1 MG PO TABS
1 mg | ORAL_TABLET | Freq: Three times a day (TID) | GASTROSTOMY | 3 refills | PRN
Start: 2021-07-11 — End: ?

## 2021-07-11 NOTE — Telephone Encounter
Spoke to pharmacy     They want Pantoprazole 1444ml for 30 day supply so that pt doesn't run out as quick

## 2021-07-11 NOTE — Telephone Encounter
New Rx Request      Last seen by MD: 07/05/21    Reason for the request: Per Pt.'s rx is calling on behalf of Pt. To ask Dr. Beverely Risen if she can write a new prescription for medication and change the dosage. Please advise, thank you.     Any Symptoms:  []  Yes  [x]  No       If yes, what symptoms are you experiencing:    o Duration of symptoms (how long):    o Have you taken medication for symptoms (OTC or Rx):      Is a particular medication being requested? Pantoprazole  1444ml    Was an appointment offered? No     Patient or caller has been notified of the turnaround time of 1-2 business day(s).

## 2021-07-11 NOTE — Telephone Encounter
Ok do I need to send new order or verbal ok

## 2021-07-11 NOTE — Telephone Encounter
New RX please.     Thank you

## 2021-07-12 ENCOUNTER — Non-Acute Institutional Stay: Payer: MEDICARE | Attending: Radiation Oncology

## 2021-07-12 ENCOUNTER — Non-Acute Institutional Stay: Payer: MEDICARE

## 2021-07-12 DIAGNOSIS — K219 Gastro-esophageal reflux disease without esophagitis: Secondary | ICD-10-CM

## 2021-07-12 NOTE — Telephone Encounter
Call Back Request      Reason for call back: Pt's daughter is calling to know status on the accomodation for pt?   I explained to Pt';s daughter that Dr. Marquette Old has been all gone week. Pt;s daughter is requesting a call back.     Please advise.      Any Symptoms:  []  Yes  [x]  No       If yes, what symptoms are you experiencing:   None   o Duration of symptoms (how long): none    o Have you taken medication for symptoms (OTC or Rx): none      If call was taken outside of clinic hours:    [] Patient or caller has been notified that this message was sent outside of normal clinic hours.     [] Patient or caller has been warm transferred to the physician's answering service. If applicable, patient or caller informed to please call us back if symptoms progress.  Patient or caller has been notified of the turnaround time of 1-2 business day(s). Yes

## 2021-07-13 MED ORDER — GLYCOPYRROLATE 1 MG PO TABS
1 mg | ORAL_TABLET | Freq: Three times a day (TID) | GASTROSTOMY | 5 refills | Status: AC | PRN
Start: 2021-07-13 — End: ?

## 2021-07-13 MED ORDER — PANTOPRAZOLE 2 MG/ML ORAL SUSPENSION (ADULT)
40 mg | Freq: Two times a day (BID) | ORAL | 6 refills | Status: AC
Start: 2021-07-13 — End: 2021-07-17

## 2021-07-14 DIAGNOSIS — R627 Adult failure to thrive: Secondary | ICD-10-CM

## 2021-07-14 DIAGNOSIS — I10 Essential (primary) hypertension: Secondary | ICD-10-CM

## 2021-07-14 DIAGNOSIS — C159 Malignant neoplasm of esophagus, unspecified: Secondary | ICD-10-CM

## 2021-07-14 DIAGNOSIS — E43 Unspecified severe protein-calorie malnutrition: Secondary | ICD-10-CM

## 2021-07-14 DIAGNOSIS — K5901 Slow transit constipation: Secondary | ICD-10-CM

## 2021-07-14 DIAGNOSIS — R531 Weakness: Secondary | ICD-10-CM

## 2021-07-14 NOTE — Progress Notes
Patient Consent to Telehealth   The patient agreed to participate in the video visit prior to joining the visit.      VIDEO VISIT--45 min incl prep time  PATIENT: Erika Kline  MRN: [id number]  DOB: 1934-04-15  DATE OF SERVICE: 07/14/2021  REFERRING PRACTITIONER: No ref. provider found  PRIMARY CARE PROVIDER: Evaristo Bury., MD    Subjective:     Erika Kline is a 88 y.o. female with squamous cell carcinoma of the esophagus.  Here for follow up visit, symptom management, and treatment planning.    Since last visit, put on cipro by pall care MD/NP for skin infection around g tube.  Became very ill and seen in ER at [facility].  Scans with concern for obstruction.  Sent home and have been discussing hospice care.  Over past few days, though, she has been feeling better---more energy, tolerating more tube feeds, out of bed more.  Still hasn't started XRT.    Denies CP or SOB.  Denies fevers.    Oncologic History:    106/2022  CT chest (done due to SOB/desat with mild exertion): small pulm nodules, indeterminate    11/4-25/2022               Admitted [facility] for nausea/vomiting/weight loss/dysphagia.  Hospitalization.  CT CAP: periph enhancing lesion in mid esoph (1.8cm), unchanged pulm nodules, no findings in the abd/pelvis.  EGD mass at 25 cm, partially obstructing.  Path: mod diff squamous cell carcinoma (no molec workup done but our eval showed PDL1 CPS 5; rest of [facility] is pending).  PEG tube placed.  Few days later, new 3.5 cm fluid collection at g tube insertion site---aspirated/treated with antibiotics.  Sent to SNF for recovery.  ?  12/9-15/2022               Admitted [facility]. CT CAP: Slightly enlarged esoph mass from outside film of Nov.  Circumferential wall invasion. No overt evidence of met disease but some indeterminate nodes. Mod intrahepatic and extrahepatic bil dil but bilirubin normal in the blood.  Rad onc consulted.      07/01/2021  ER at [facility].  Given cipro for pseudomonas infection around g tube (?) and got very sick.  Scans with bil dil (chronic) and moderately severe constipation.    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  PAF?on eliquis, CKD, chronic diastolic dysfunction, nonrheumatic MR sp mitraclip    Review of Systems:    Constitutional: No fevers, chills. Fatigue.  Recently worse but now slowly better again?  Eyes: No recent blurry vision, double vision or visual changes.  ENT: No recent sinus pain or congestion. No sore throat or mouth sores.   Neck: No neck pain or stiffness.  Cardiovascular: No chest pain or pressure, no palpitations.   Pulmonary: Shortness of breath, no cough or wheezing.   Gastrointestinal: Occ abdominal pain, nausea, no vomiting. Constipation alt w/diarrhea.  No melena or BRBPR.   Genitourinary: No urinary frequency, urgency, or dysuria.   Musculoskeletal: No joint pain, muscle pain or back pain.   Neurologic: No headaches. No numbness, tingling or weakness.   Dermatologic: No rash, pruritus or recent skin changes.   Hematologic: No abnormal bruising or bleeding.   Endocrine: No polyuria, polydipsia, heat or cold intolerance.   Psychiatric: No depressive symptoms, no suicidal or homicidal ideation    Objective:   NO PHYSICAL EXAM AS THIS WAS A VIDEO VISIT    ECOG performance  status: 2-3.  There were no vitals taken for this visit.  LAST EXAM:  Physical Exam:  General: No acute distress.  Cachectic, but reports to now gaining weight.  Tube feedings (continuously).  Head: Normocephalic, atraumatic.  Eyes: Sclera anicteric. EOMI.  ENT: Hearing grossly normal bilaterally. Oropharynx is clear, mucus membranes are moist.  No oral ulcers noted. Good dentition.  Neck: Supple. Trachea midline.   Cardiac: Regular rate and rhythm. Normal S1, S2. No murmurs, rubs, or gallops.  Respiratory: Clear to auscultation bilaterally. No wheezes, rales, or rhonchi noted. Respiratory effort appears normal.   Abdomen: Soft, nontender and nondistended. Bowel sounds are present and normoactive. No organomegaly is appreciated.  G-tube intact.  Musculoskeletal: No edema. No cyanosis. Extremities are warm and well-perfused.   Extremities: Warm. No edema. No cyanosis.  Neurologic: Gait appears normal. Sensation intact to light touch in all four extremities. Oriented to person, place and time.   Hematologic: No bruising, purpura or petechiae are noted.   Dermatologic: Skin intact.  No rashes appreciated.   Lymphatic: No palpable cervical, supraclavicular, axillary or inguinal adenopathy appreciated.   Psychiatric: Affect appropriate.  Pleasant and conversant.       Assessment/Plan:     Assessment: Erika Kline is a 88 y.o. female with squamous cell carcinoma of the esophagus.  Here for follow up visit, symptom management, and treatment planning.    Plan:  Squamous Cell carcinoma of the esophagus:  Since last visit, was to begin XRT with Dr Carleene Cooper.  Then, issue described above came up with her g tube infeciton and cipro.  Acutely worse and hospice was called.  Since hospice came last week though, she has gotten better and wants again to pursue XRT.    Discussed that active therapy would indeed include short course XRT and in the person with appropriate perf status, chemo as well.  Radiation isn't that toxic, but getting back and forth to the treatments can indeed be challenging.  She/her family had planned on renting a hotel room close by the medical center.  Today (on video with appropriate qualifications) she looks as she did a few weeks ago and might indeed be a candidate for radiation.  They will speak with Dr Carleene Cooper.  It would also be entirely reasonable to proceed with hospice care.  See goals of care note below.  Continue with home health nursing team as additional support at home.  Continue to optimize supportive care.    Malnutrition/Dysphagia:  G-tube continuous feedings.  Strict NPO.  Jevity 65 ml/hour.  Followed closely by dietician and [facility] nursing.  Follow expectantly.    Cancer Pain  Notes refer to a pall care team locally that is managing her pain.  Cont to find the right balance between enough and not too much nor too little.    Goals of Care  Very much focused on quality of life, but if active therapy can improve her quality, she is willing to consider.  They will speak with Rad Onc and of course, hospice is on board waiting for whenever she wishes to enroll.    We discussed today extensively this patient's complicated medical and oncologic history with other physicians and allied health professionals involved in the care.     Roxanne Gates, MD FACP

## 2021-07-15 ENCOUNTER — Non-Acute Institutional Stay: Payer: MEDICARE

## 2021-07-15 ENCOUNTER — Non-Acute Institutional Stay: Payer: MEDICARE | Attending: Radiation Oncology

## 2021-07-15 ENCOUNTER — Telehealth: Payer: MEDICARE

## 2021-07-15 NOTE — Telephone Encounter
Faxed to the number mentioned below attn:[email or web address]

## 2021-07-16 ENCOUNTER — Telehealth: Payer: MEDICARE

## 2021-07-16 ENCOUNTER — Non-Acute Institutional Stay: Payer: MEDICARE | Attending: Radiation Oncology

## 2021-07-16 ENCOUNTER — Non-Acute Institutional Stay: Payer: MEDICARE

## 2021-07-16 ENCOUNTER — Ambulatory Visit: Payer: MEDICARE | Attending: Gastroenterology

## 2021-07-16 DIAGNOSIS — C159 Malignant neoplasm of esophagus, unspecified: Secondary | ICD-10-CM

## 2021-07-16 DIAGNOSIS — K8689 Other specified diseases of pancreas: Secondary | ICD-10-CM

## 2021-07-16 DIAGNOSIS — K219 Gastro-esophageal reflux disease without esophagitis: Secondary | ICD-10-CM

## 2021-07-16 DIAGNOSIS — Z931 Gastrostomy status: Secondary | ICD-10-CM

## 2021-07-16 DIAGNOSIS — K838 Other specified diseases of biliary tract: Secondary | ICD-10-CM

## 2021-07-16 DIAGNOSIS — R198 Other specified symptoms and signs involving the digestive system and abdomen: Secondary | ICD-10-CM

## 2021-07-16 DIAGNOSIS — L089 Local infection of the skin and subcutaneous tissue, unspecified: Secondary | ICD-10-CM

## 2021-07-16 DIAGNOSIS — K5900 Constipation, unspecified: Secondary | ICD-10-CM

## 2021-07-16 DIAGNOSIS — K9422 Gastrostomy infection: Secondary | ICD-10-CM

## 2021-07-16 MED ORDER — SULFAMETHOXAZOLE-TRIMETHOPRIM 200-40 MG/5ML PO SUSP
20 mL | Freq: Two times a day (BID) | ORAL | 0 refills | Status: AC
Start: 2021-07-16 — End: ?

## 2021-07-16 MED ORDER — PANTOPRAZOLE 2 MG/ML ORAL SUSPENSION (ADULT)
40 mg | Freq: Two times a day (BID) | ORAL | 0 refills | Status: AC
Start: 2021-07-16 — End: 2021-07-25

## 2021-07-16 NOTE — Patient Instructions
It was a pleasure meeting with you today in the [facility].  During our visit we discussed the following:    Try taking Reglan with meals and see if fullness improves  Appointment with dermatologist in April 2023  Schedule CT scan - Radiology Scheduling: [id number]   Labs in clinic  Take Bactrim 20mL through tube twice daily for 5 days  Talk to surgeon about how the G tube was placed before we can replace it  Recommend someone from wound care evaluate G tube site    Please call the clinic with any questions or concerns. [phone]    Donnajean Chesnut S. Garlon Hatchet, MD  [place] Division of Digestive Diseases

## 2021-07-16 NOTE — Telephone Encounter
Called the pt and left a VM with Dr Sanjuana Kava message below

## 2021-07-16 NOTE — Consults
Outpatient Gastroenterology Consult     ATTENDING: Georgetta Haber, MD  PATIENT: Erika Kline  MRN: [id number]  DOB: 1934-03-22  DATE OF SERVICE: 07/16/2021    REFERRING PRACTITIONER: Evaristo Bury., MD  PRIMARY CARE PROVIDER: Evaristo Bury., MD    REASON FOR REFERRAL:   Chief Complaint   Patient presents with   ? Establish Care   ? G-tube Problem        Subjective:     Chief Complaint:  Erika Kline is a 88 y.o. 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    Initial clinic visit 07/16/21:    Patient admitted to OSH in 04/2021 with nausea/emesis, weight loss, dysphagia. Found to have partially obstructing mass in esophagus at 25cm on EGD with path c/w SCC. Underwent laparoscopic G tube placement with general surgeon Dr. Aris Everts on 04/10/21 c/b abscess around G tube s/p aspiration. Admitted to [facility] in 05/2021 - found to have moderate intra/extrahepatic biliary dilatation. Presented to ER at [facility] on 07/01/21 with possible infection around G tube. Treated with ciprofloxacin. CTAP without abscess around G tube. Followed by Dr. Pollyann Kennedy and last seen on 07/08/21 - initially was transitioned to hospice, however now pursuing XRT.     She has been having bleeding around the G tube as well as leakage of formula. No bleeding from the tube. Also has abdominal pain in the area of the tube and prevents her from moving around. Takes Eliquis. Also has been having full with feeding and started on miralax BID with regular bowel moments. On fentanyl and oxycodone. Prescribed reglan, has not tired.     Past Medical History:  Past Medical History:   Diagnosis Date   ? Allergy, unspecified not elsewhere classified    ? Arthritis    ? Basal cell carcinoma    ? Cancer (HCC/RAF)     Stomach Cancer S/P chemo   ? Depression    ? Gastric lymphoma (HCC/RAF)    ? Gastric lymphoma (HCC/RAF)    ? Gastric lymphoma (HCC/RAF) ? Hyperlipidemia    ? Hypertension      Patient Active Problem List   Diagnosis   ? CAD (coronary artery disease)   ? Benign essential HTN   ? Dyslipidemia   ? OA (osteoarthritis)   ? Depression   ? Lymphoma of cardia of stomach (HCC/RAF)   ? Cystitis   ? Weakness generalized   ? Failure to thrive in adult   ? Esophageal cancer (HCC/RAF)        Past Surgical History:  Past Surgical History:   Procedure Laterality Date   ? breast implants  1978   ? BREAST SURGERY         Family History: family history includes Cancer in her sister; Heart attack in her father.  [x]   No family history of colorectal cancer, gastric cancer, inflammatory bowel disease, celiac disease, Barretts esophagus.   []   Family history of colorectal cancer    []   Family history of IBD   []   Family history of celiac disease  []   Family history of other GI disease    Social History:  reports that she has never smoked. She does not have any smokeless tobacco history on file. She reports that she does not drink alcohol and does not use drugs.      Allergies :    is allergic to amlodipine  besylate and clarithromycin.    Medications:  Outpatient Medications Marked as Taking for the 07/16/21 encounter (Office Visit) with Danyon Mcginness, Valetta Mole., MD   Medication   ? ALPRAZolam 0.25 mg tablet   ? apixaban 2.5 mg tablet   ? famotidine 40 mg/5 mL suspension   ? glycopyrrolate (ROBINUL) 1 mg tablet   ? metoprolol tartrate 25 mg tablet   ? ondansetron 4 mg/42mL solution   ? oxyCODONE 5 mg/5 mL solution   ? pantoprazole 2 mg/mL oral suspension   ? sertraline 20 mg/mL solution   ? White Petrolatum-Mineral Oil (ARTIFICIAL TEARS) 83-15% ophthalmic ointment       Review of Systems:  A complete ROS was performed. Pertinent positives and negatives are in the HPI.  Remainder of 14 systems is negative.      Objective:     Physical Exam:    Vitals: BP 108/71  ~ Pulse 81  ~ Ht 5' 5'' (1.651 m)  ~ SpO2 98%  ~ BMI 17.14 kg/m?     Constitutional:WD/WN alert, appears stated age and cooperative   Skin: Skin color normal. No obvious rashes or lesions   Head: Normocephalic, without obvious abnormality, atraumatic   Eyes: conjunctivae clear - no icteris.     Mouth: No thrush   Back:  No CVA tenderness.   Lungs: clear to auscultation bilaterally   Heart: regular rate and rhythm, no appreciated murmur, click, rub or gallop   Abdomen: Abdomen is soft. 18Fr G tube in place with presumed 7-10cc balloon (not changed since surgery). There erythema erythema of the skin on the left side of the G tube side with significant TTP and induration in the area. No drainage or bleeding.    Musculoskeletal: negative   Extremities: extremities grossly normal, atraumatic, no cyanosis or edema   Neurologic: Grossly normal   Psychiatric: oriented to time, place and person      Lab Review:  I have   [x] reviewed radiology,  [x] reviewed labs, [x] reviewed diag med test, [x] reviewed & summarized old records, [] requested outside medical records.     Lab Results   Component Value Date    WBC 7.52 06/05/2021    HGB 13.8 06/05/2021    HCT 42.2 06/05/2021    MCV 99.8 (H) 06/05/2021    PLT 268 06/05/2021     Lab Results   Component Value Date    CREAT 0.84 06/05/2021    BUN 25 (H) 06/05/2021    NA 136 06/05/2021    K 5.3 06/05/2021    CL 97 06/05/2021    CO2 32 (H) 06/05/2021    ALT 24 06/05/2021    AST 36 06/05/2021    ALKPHOS 129 (H) 06/05/2021    BILITOT 0.4 06/05/2021    ALBUMIN 3.5 (L) 06/05/2021    LIPASE 84 (H) 05/10/2021     No results found for: SRWEST, CRP, GLIADINIGA, GLIADINIGG, ENDOMAB, TRNGLUTIGAAB, IGASER  Lab Results   Component Value Date    INR 1.0 05/10/2021     No results found for: HELPYLAB, HELPYLAGSTL      Imaging:  07/01/21 CTAP:  The common bile duct is dilated measuring up to 10 mm.  There is mild to moderate intrahepatic biliary dilatation.  The pancreatic duct is dilated measuring up to 6 mm at the pancreatic head.  Both the common bile duct and the pancreatic duct appear dilated to the level of the ampulla.    Gallbladder has no evidence of radiopaque gallstone or gallbladder wall thickening.  There is a 1.8 cm likely cyst in the left liver. The spleen, pancreas, and adrenal glands are normal in appearance.    Both kidneys have a prominent extrarenal pelvis, left more than right. There is no significant hydronephrosis.  The urinary bladder is unremarkable.    The bowel has no abnormal dilatation.  Moderate to large colonic stool.  Colonic diverticulosis.  Percutaneous gastrostomy catheter with balloon in the gastric lumen.  There is no evidence of an abscess around the gastrostomy tube or in the abdominal wall.    There is no free fluid or free air.    Osteopenia.  Degenerative changes of the spine.  Mild to moderate superior L2 vertebral compression and mild L3 vertebral compression are age indeterminate.    Minimal pleural effusion at the left lung base.  A few small nodular lung opacities, measuring up to 12 mm in the lateral left lung base.    GI Studies:  04/2021 EGD:  Findings: There was a friable and firm mass at 25cm from the incisors that was partially obstructing and hemi-circumferential. There was oozing and resistance with attempted passage of the endoscope.   Procedures: Cold forceps biopsies were taken.    Visit Diagnoses  / Problems addressed on 07/16/2021:     Encounter Diagnoses   Name Primary?   ? Skin infection at gastrostomy tube site (HCC/RAF) Yes   ? Abdominal fullness    ? Constipation, unspecified constipation type    ? Squamous cell esophageal cancer (HCC/RAF)    ? Dilated bile duct    ? Gastrostomy tube in place (HCC/RAF)       Assessment & Plan :       Erika Kline is a 88 y.o. female who presents for:     # Esophageal SCC  # G tube in place  # Skin infection at G tube    Diagnosed with SCC of the esophagus in 04/2021 and s/p surgically placed G tube at that time which was c/b abscess at G tube site s/p drainage. Had worsening pain around G tube and presented to [facility] in 07/01/21 with CTAP negative for abscess. Treated with cipro without improvement. Now with erythema on left side of G tube over skin with induration and tenderness, likely due to superficial skin infection, possibly with abscess, although no fluctuance. No systemic symptoms. G tube has not been changed since placement, however functioning properly.     - start Bactrim DS liquid BID x 5 days for strep/staph coverage  - repeat CTAP to r/o abscess - obtain GFR  - referred to dermatology  - palliative care to request home wound care services  - do not recommend upsizing tube at this time  - advised patient's daughter to discuss how G tube was placed with surgeon - looks like 18Fr balloon tube, however unclear if sutures involved    # Post-prandial fullness: Has fullness after tube feeds, likely related to delayed gastric emptying in setting of opiate use and constipation, however has not improved with laxative use.  # Constipation: Stable on miralax BID.  - prescribed reglan by palliative care - advised to take with meals  - continue miralax BID    # Dilated bile ducts  # Dilated pancreatic ducts    CTAP with CBD 10 mm with mild to moderate intrahepatic biliary dilatation and PD up to 6mm. LFTs with mild elevation in ALP, otherwise normal.     - consider MRCP for further evaluation    RTC after  above work-up completed    I reviewed the patient's labs, imaging, and/or endoscopies and my interpretation is above.     I reviewed notes from the patient's PCP Evaristo Bury., MD and have incorporated this into the patient history.     The above recommendation were discussed with the patient. The patient has all questions answered satisfactorily and is in agreement with this recommended plan of care.    Berel Najjar S. Tyrina Hines, MD   07/16/2021 4:07 PM      Medical Decision Making addressed on 07/16/2021:      Billing by Medical Decision Making meets requirements for 2/3 areas - Problem, Data, Risk:  Problem: MODERATE complexity - 1 or more chronic illnesses with exacerbation, progression or side effects of treatment was addressed during this encounter.  Data Complexity: MODERATE complexity - review tests, order tests, review notes, independent historian (3/4) OR interpret test OR discuss with specialist [1/3 categories].   Risk: MODERATE risk - Prescription drug management.

## 2021-07-16 NOTE — Telephone Encounter
PDL Call to Clinic    Reason for Call: Crystal from [facility] is returning Dr. Sanjuana Kava call regarding radiation.    Appointment Related?  []  Yes  [x]  No     If yes;  Date:  Time:    Call warm transferred to PDL: [x]  Yes  []  No    Call Received by Clinic Representative: Dr. Beverely Risen    If call not answered/not accepted, call received by Patient Services Representative:

## 2021-07-16 NOTE — Telephone Encounter
Refill Request    Verified patient was seen within the last 6 months. If not seen within 6 months, offered appointment.    Pharmacy is calling for 700ML  14 day supply on RX for billing  pantoprazole 2 mg/mL oral suspension        Collected prescription information from patient.     Pended orders for provider to review and sign.     Pharmacy location was confirmed and class was set for pended orders.    Patient or caller has been notified of the turnaround time of 1-2 business day(s).

## 2021-07-17 ENCOUNTER — Non-Acute Institutional Stay: Payer: MEDICARE

## 2021-07-17 ENCOUNTER — Telehealth: Payer: MEDICARE

## 2021-07-17 ENCOUNTER — Inpatient Hospital Stay: Payer: MEDICARE | Attending: Radiation Oncology

## 2021-07-17 NOTE — Telephone Encounter
Spoke to her   Thanks

## 2021-07-17 NOTE — Telephone Encounter
Faxed note.

## 2021-07-17 NOTE — Telephone Encounter
Hello Dr. Beverely Risen,     Spoke with [facility] with [place]. Requesting clinical or justifications for recently prescribed duo bags for food and water, needing to know the reason that patient needs this for insurance medicare purposed.    [facility] - Call back requested  [phone]    Thank you,  Judson Roch

## 2021-07-17 NOTE — Telephone Encounter
Kristine called from [place] to request a call back with Dr. Beverely Risen.   Minette Headland has radiology questions in regards to patient.      Please reach out to [place] at your earliest availability.   [phone]    Thank you.

## 2021-07-17 NOTE — Telephone Encounter
I spoke with Thompson Grayer [phone] to clarify that the patients radiation treatments are purely palliative. She will look into it and we will fax over notes

## 2021-07-18 ENCOUNTER — Non-Acute Institutional Stay: Payer: MEDICARE

## 2021-07-18 ENCOUNTER — Ambulatory Visit: Payer: MEDICARE

## 2021-07-18 ENCOUNTER — Telehealth: Payer: MEDICARE

## 2021-07-18 MED ORDER — FENTANYL 25 MCG/HR TD PT72
1 | MEDICATED_PATCH | TRANSDERMAL | 0 refills | 15.00000 days | Status: AC
Start: 2021-07-18 — End: ?
  Filled 2021-07-24: qty 5, 15d supply, fill #0

## 2021-07-18 NOTE — Telephone Encounter
Called rosa.   No answer left message

## 2021-07-18 NOTE — Telephone Encounter
Forwarded by:  

## 2021-07-18 NOTE — Telephone Encounter
Hi Dr. Beverely Risen,     Minette Headland called again in regards to receiving a call back to discuss radiology in regards to patient.     Previous phone number recorded was incorrectly on my end .   Please reach out to [place] at your earliest availability.   Correct phone number: [phone]    Call Priority: Not Urgent    Thank you

## 2021-07-18 NOTE — Telephone Encounter
Spoke to her   She will check w billing for radiation to see if compatible w/ hospice

## 2021-07-18 NOTE — Telephone Encounter
Call Back Request      Reason for call back:   Patient's daughter requesting a call back from MD, states her mothers G Tube has been completely blocked for the last 2.5 - 3 hours.  They have tried everything to unblock G Tube but have had no luck.  Please assist, thank you.  CBN [phone]    Any Symptoms:  []  Yes  [x]  No       If yes, what symptoms are you experiencing:    o Duration of symptoms (how long):    o Have you taken medication for symptoms (OTC or Rx):      If call was taken outside of clinic hours:    [] Patient or caller has been notified that this message was sent outside of normal clinic hours.     [] Patient or caller has been warm transferred to the physician's answering service. If applicable, patient or caller informed to please call us back if symptoms progress.  Patient or caller has been notified of the turnaround time of 1-2 business day(s).

## 2021-07-18 NOTE — Telephone Encounter
Call Back Request      Reason for call back: Rosa if returning Dr. Sanjuana Kava call, she states they just need justifications for the bags.     Any Symptoms:  []  Yes  [x]  No       If yes, what symptoms are you experiencing:    o Duration of symptoms (how long):    o Have you taken medication for symptoms (OTC or Rx):      If call was taken outside of clinic hours:    [] Patient or caller has been notified that this message was sent outside of normal clinic hours.     [] Patient or caller has been warm transferred to the physician's answering service. If applicable, patient or caller informed to please call us back if symptoms progress.  Patient or caller has been notified of the turnaround time of 1-2 business day(s).

## 2021-07-19 ENCOUNTER — Non-Acute Institutional Stay: Payer: MEDICARE | Attending: Radiation Oncology

## 2021-07-19 ENCOUNTER — Non-Acute Institutional Stay: Payer: MEDICARE

## 2021-07-19 NOTE — Addendum Note
Addended by: Janna Arch on: 07/18/2021 04:19 PM     Modules accepted: Orders

## 2021-07-19 NOTE — Telephone Encounter
Spoke to patients daughter. Daughter stated that they are currently at ED and patient might get admitted. Daughter also wanted me to inform you that she got confirmation from the surgeon who placed the G-tube stating that ''balloon fixed to stomach wall with silk 3 times''. Daughter also stated that ED admitting doctor might be giving [name] a call to provide details of patient.

## 2021-07-20 NOTE — Patient Instructions
GASTROINTESTINAL RADIATION THERAPY - PATIENT EDUCATION      General Instructions:      Many side effects including fatigue, pain/skin reaction on the treatment site, and nausea/vomiting, which you may experience during your treatment, should slowly improve a few weeks after completion of treatment.   You may continue activity and diet as tolerated, unless otherwise noted.   We have an in-house Registered Dietician and Child psychotherapist available if needed and/or upon request.   Please talk to your Radiation Oncologist about taking supplements/vitamins during your treatment. Antioxidants are discouraged during radiation treatment.   You may shower, but do not scrub the treated area. Avoid sun exposure to the treated area and use sunscreen. Your skin in the treatment area will be more sensitive to the sun, up to a year after treatment is completed.    Use mild soaps until skin is completely healed.   In addition to chest pain, please go to the nearest emergency room if you are experiencing increase shortness of breath, passing out/ fainting, unusual or bad headache, sudden inability to speak, see, walk or move, sudden weakness or drooping, dizziness that does not go away, sudden confusion, heavy bleeding, excessive vomiting or diarrhea, severe pain, high fever or seizures.     Site Specific Symptoms: GI       You may experience the following common symptoms during and/or after completion of treatment:     Fatigue    Pressure or discomfort in area of radiation   Skin changes (peeling, blistering, itching, or redness)   Diarrhea    Esophagitis (irritation/inflammation of the esophagus)   Urinary symptoms (increased frequency or pain)   Nausea/Vomiting       Radiation Therapy: Managing Short-Term Side Effects   Radiation therapy uses high-energy X-rays or particles to kill cancer cells. Some normal cells can also be affected. This causes side effects such as dry skin, tiredness (fatigue), or changes in your appetite. Most side effects go away when your radiation therapy is over but it may take 4 to 6 weeks to improve.   Having side effects of radiation therapy does not mean that your cancer is getting worse or that therapy isn?t working.     Caring for your skin  Skin problems may happen where your body gets radiation. Your skin may become dry, itchy, red, and start peeling. It may darken in that spot, like a tan. To care for your skin:   Don?t scrub on the treatment area. Clean that area of the skin every day. Use warm water and mild soap, or as your healthcare provider advises. Pat the skin afterward or let it air dry.  Ask your therapy team what lotion to use and when to use it. And let them know if you are using any kind of lotion or moisturizers.  Keep the treated area out of direct sunlight. Hats, protective clothing, and sunscreen are very helpful.  Don't remove ink marks unless your radiation therapist says you can. Don?t scrub the marks when you wash. Let water run over them and pat them dry.  Protect your skin from heat or cold. Don't use hot tubs, saunas, hot pads, or ice packs.  Wear soft, loose clothing to keep skin from rubbing.    Fighting tiredness  The cancer itself or the radiation therapy may cause you to feel tired. Your body is working hard to heal and repair itself. To feel better:   Try light exercise each day. Take short walks.  Plan  tasks for the times when you tend to have the most energy. Ask for help when you need it.  Relax before you go to bed to sleep better. Try reading or listening to soothing music.  Let your cancer care team know if you continue to have fatigue that is not getting better. They may be able to offer ways to help.     Coping with appetite changes  Tell your therapy team if you find it hard to eat or have no appetite. You may need to see a nutritionist. This is a healthcare provider with special training in meal planning. To keep your strength up, you need to eat well and maintain your weight. Think of healthy eating as part of your treatment. Try these tips:   Eat slowly.  Eat small meals several times a day.  Eat more food when you?re feeling better, even if it is not mealtime.  Ask others to keep you company when you eat.  Stock up on easy-to-prepare foods.  Eat foods high in protein and calories.  Drink plenty of water and other fluids.  Ask your healthcare provider before taking any vitamins.    Site-specific side effects  These side effects include the following:   You may lose hair in the area being treated. The hair may not grow back after treatment.  Your mouth or throat can become dry or sore if your head or neck is being treated. Sip cool water to help ease discomfort.  Tell your healthcare provider if you have nausea, diarrhea, or constipation. You may need to take medicine or follow a special diet.    Talk with your healthcare team  Radiation therapy can also have other side effects, including some that might not show up until years later. Talk with your healthcare team about what to expect with the type of radiation therapy you are getting, including when you should call them with concerns.      Supplements To Avoid During Treatment  Radiation works in part by creating free radicals -- highly energized molecules that damage cancer cells, free radicals environment can damage all cells but in the case of radiation treatment they are focused on the cancer cells. Antioxidants help prevent or neutralize free radicals.  Due to the potential conflict between the goal of radiation therapy (to make free radicals to kill cancerous cells) and the goal of antioxidants (to neutralize free radicals), it is recommended that patients stop taking any antioxidant supplements during radiation therapy, as they can reduce the effectiveness of your treatment. When radiation is finished, you can resume taking your supplements.  Throughout your treatment, do your best to eat a well-balanced diet of whole fruits, vegetables, and whole grains that contains all of the nutrients you need. Vitamins and minerals that come naturally from food are safe and not likely to interfere with treatment as long as they are consumed in reasonable doses.   During the course of your radiation therapy, avoid these supplements (pills):  Vitamin A  Vitamin C/Emergen-C/Airborne  Vitamin E  Selenium  CoQ10  Turmeric/Curcumin  Ginger  Lycopene  Green Tea Capsules   To contact our Dietitian/Nutritionist on site, please call:    Debarah Crape, MS, RDN  Registered Dietitian, Department Of Radiation Oncology  [facility]  [address]  [place], [place] [address]  [phone] (cell), [phone] (office), fax [phone]  [email or web address]     To contact our Social Worker on site, please call:  Hulen Shouts, MSW, LCSW  Clinical Social Worker III  Radiation Oncology  [address] [facility] ~ [address]  [place], [place] [address]  Phone: [phone] ~ Fax: [phone]      Remember the symptoms or side effects mentioned above are usually temporary and begin to resolve a few weeks after treatment ends. It is advisable to continue skin care regimen as directed by your Radiation Oncologist and Radiation Oncology Nurses. If needed, there are medications that can be prescribed to make you more comfortable, so please do not hesitate to reach out to your Radiation Oncology team if you start to experience any of these symptoms.      If the symptoms or side effects mentioned above do not resolve over time or you notice any new symptoms, such as heavy bleeding, a fever or flu-like symptoms, please let us know by calling [facility] Mondays-Fridays 8 a.m. - 5 p.m.  [facility] at [phone]   [facility] at [phone]  [facility] at [phone]    Call  [phone] (after hours and weekends, ask for Radiation Oncologist on-call) and go to the Emergency Department for further evaluation.?     Nursing station: [place] [phone]; [address] [phone]; [place]lon Handing [phone]

## 2021-07-22 ENCOUNTER — Ambulatory Visit: Payer: MEDICARE

## 2021-07-23 ENCOUNTER — Inpatient Hospital Stay: Payer: MEDICARE | Attending: Radiation Oncology

## 2021-07-23 ENCOUNTER — Non-Acute Institutional Stay: Payer: Commercial Managed Care - Pharmacy Benefit Manager | Attending: Radiation Oncology

## 2021-07-23 ENCOUNTER — Inpatient Hospital Stay: Payer: MEDICARE

## 2021-07-23 ENCOUNTER — Non-Acute Institutional Stay: Payer: MEDICARE

## 2021-07-23 DIAGNOSIS — K219 Gastro-esophageal reflux disease without esophagitis: Secondary | ICD-10-CM

## 2021-07-23 MED ORDER — PANTOPRAZOLE 2 MG/ML ORAL SUSPENSION (ADULT)
40 mg | Freq: Two times a day (BID) | ORAL | 0 refills
Start: 2021-07-23 — End: ?

## 2021-07-23 NOTE — Telephone Encounter
I have called her multiple times, and left messages. If/when she calls back can you provider her with this information   Patient needs a dual bag for food and water.  Given she continues to have difficulty and pain with her tube, putting her a dual bag providing her food and her water it may minimize agitating her tube and prevent ongoing infections.

## 2021-07-24 ENCOUNTER — Inpatient Hospital Stay: Payer: PRIVATE HEALTH INSURANCE

## 2021-07-25 ENCOUNTER — Inpatient Hospital Stay: Payer: BLUE CROSS/BLUE SHIELD

## 2021-07-25 ENCOUNTER — Telehealth: Payer: BLUE CROSS/BLUE SHIELD

## 2021-07-25 MED ORDER — PANTOPRAZOLE 2 MG/ML ORAL SUSPENSION (ADULT)
40 mg | Freq: Two times a day (BID) | ORAL | 0 refills | 18.00000 days | Status: AC
Start: 2021-07-25 — End: ?
  Filled 2021-07-31: qty 700, 18d supply, fill #0

## 2021-07-25 NOTE — Telephone Encounter
Patient Mykenzi Vanzile MRN: [id number]       PMD-Dr. Renee Harder on *no availability within 7 days*  [address] [phone]   *Starla Link, would you be able to assist with requesting an apt accommodation? Please advise. Thank you*        Best regards,       Bufford Lope  Hospitalist Services            From: Jenny [email or web address]         ----------------  Discharges  ----------------  2/17    Daiva Huge. 21-Nov-2035. PMD Beverely Risen  f/u PMD 2 weeks        Jenny Reichmann, MD  Assistant  Professor, [facility] Hospitalist Service  [facility]  [address] [place]  [place], [place] [address]  Office: [phone], Fax: [phone]

## 2021-07-26 ENCOUNTER — Inpatient Hospital Stay: Payer: MEDICARE

## 2021-07-26 NOTE — Telephone Encounter
Hi Kathlee Nations,   Can you reach out to [facility] to provide Dr.Sanna's recommendations below?     [facility] - Call back requested  [phone]    Thank you for your help!

## 2021-07-26 NOTE — Telephone Encounter
Faxed MD's recommendations to [facility] fax: [phone]

## 2021-07-26 NOTE — Telephone Encounter
Called Rose at [phone] and left a message to call back to inform the following information from Dr Beverely Risen to justify why patient needs bags:   Patient needs a dual bag for food and water. Given she continues to have difficulty and pain with her tube, putting her a dual bag providing her food and her water it may minimize agitating her tube and prevent ongoing infections.

## 2021-07-29 ENCOUNTER — Inpatient Hospital Stay: Payer: MEDICARE | Attending: Radiation Oncology

## 2021-07-29 ENCOUNTER — Non-Acute Institutional Stay: Payer: MEDICARE | Attending: Radiation Oncology

## 2021-07-29 ENCOUNTER — Inpatient Hospital Stay: Payer: MEDICARE

## 2021-07-29 DIAGNOSIS — C159 Malignant neoplasm of esophagus, unspecified: Secondary | ICD-10-CM

## 2021-07-29 DIAGNOSIS — K219 Gastro-esophageal reflux disease without esophagitis: Secondary | ICD-10-CM

## 2021-07-29 MED ORDER — GUAIFENESIN ER 600 MG PO TB12
600 mg | ORAL_TABLET | Freq: Two times a day (BID) | ORAL | 0 refills | 30.00 days | Status: AC
Start: 2021-07-29 — End: 2021-07-30

## 2021-07-29 MED ORDER — GUAIFENESIN 100 MG/5ML PO LIQD
100 mg | Freq: Four times a day (QID) | ORAL | 2 refills | 12.00 days | Status: AC | PRN
Start: 2021-07-29 — End: ?

## 2021-07-29 MED ORDER — FAMOTIDINE 40 MG/5ML PO SUSR
40 mg | Freq: Two times a day (BID) | ORAL | 6 refills | 20.00 days | Status: AC
Start: 2021-07-29 — End: ?
  Filled 2021-07-31: qty 200, 20d supply, fill #0

## 2021-07-29 NOTE — Treatment Summary
Radiation Oncology Abdominal On-Treatment Visit Note    Patient: Erika Kline  MRN: [id number]  DOB: 1934-02-14    Date of Service: 07/29/2021    Referring Practitioner: No ref. provider found  Primary Care Provider: Evaristo Bury., MD  Resident Physician: Maudie Mercury, MD, PhD   Attending Physician: Aura Fey, MD, MPH     Identifying Data: Erika Kline is a 88 y.o. female with PMH of gastric lymphoma in remission since 2005, paroxysmal atrial fibrillation on Apixaban, non-rheumatic MR s/p mitraclip, with a esophageal squamous cell carcinoma, ongoing palliative radiation for obstruction    Treatment Detail:      Interval History: [name] has gas pain starting a few days ago, using GasX, venting tube but secretions from tube exacerbating the wound. Using Miralax daily. Using antacid. Also noticing fatigue. Has thick secretions, using glycopyrrolate at night    Current Toxicity  Bloating: Grade 2: Symptomatic, decreased oral intake; change in bowel function  Fatigue: Grade 1: Fatigue relieved by rest      ECOG - [place] Cooperative Oncology Group performance status: ECOG Score - 3 - Capable of only limited selfcare, confined to bed or chair more than 50% of waking hours.     Physical Exam  Constitutional:       General: She is not in acute distress.     Appearance: She is well-developed.      Comments: In wheelchair, productive oral secretions   HENT:      Head: Normocephalic and atraumatic.   Pulmonary:      Effort: Pulmonary effort is normal. No respiratory distress.   Neurological:      Mental Status: She is alert and oriented to person, place, and time.   Psychiatric:         Thought Content: Thought content normal.       Wt Readings from Last 10 Encounters:   06/13/21 1423 103 lb (46.7 kg)   06/05/21 1607 102 lb 12.8 oz (46.6 kg)   05/12/21 2023 100 lb 8 oz (45.6 kg)   05/11/21 1947 100 lb 8 oz (45.6 kg)   05/11/21 0004 (!) 96 lb 12.8 oz (43.9 kg)   05/10/21 1754 100 lb (45.4 kg)   10/04/15 1124 107 lb 6.4 oz (48.7 kg)   08/28/15 1348 111 lb (50.3 kg)   06/07/15 1025 112 lb (50.8 kg)   05/17/13 1447 110 lb (49.9 kg)   01/06/13 0858 104 lb (47.2 kg)   11/25/12 1048 106 lb (48.1 kg)   04/07/12 1521 110 lb (49.9 kg)     BP 107/66  ~ Pulse 78  ~ Temp 37.1 ?C (98.7 ?F) (Tympanic)  ~ Resp 14  ~ SpO2 96%    Pain Information (Last Filed)     Score Location Comments Edu?      8 Abdomen None None          Labs/Radiology:  Lab Results   Component Value Date/Time    HGB 13.8 06/05/2021 05:25 PM    HGB 13.2 07/15/2010 03:17 PM    WBC 7.52 06/05/2021 05:25 PM    WBC 5.74 07/15/2010 03:17 PM    PLT 268 06/05/2021 05:25 PM    PLT 150 07/15/2010 03:17 PM    BUN 25 (H) 06/05/2021 05:25 PM    BUN 9 09/18/2011 10:01 AM    BUN 12 03/04/2011 11:06 AM    CREAT 0.84 06/05/2021 05:25 PM    CREAT 0.8 03/04/2011 11:06 AM    NA 136  06/05/2021 05:25 PM    NA 136 03/04/2011 11:06 AM    K 5.3 06/05/2021 05:25 PM    K 4.8 03/04/2011 11:06 AM    CL 97 06/05/2021 05:25 PM    CL 99 03/04/2011 11:06 AM    CO2 32 (H) 06/05/2021 05:25 PM    CO2 29 03/04/2011 11:06 AM       Reviews:  ? In-room imaging checked  ? QA Checked  ? Reviewed treatment setup, dosimetry, dose delivery, and treatment  ? CBC Status reviewed      Plan:  ? Continue radiation  ? GasX up to 500mg /daily  ? Mucinex and famotidine rx sent  ? Swallow evaluation after radiation to assess for potential pleasure feeds  ? Consider suction device for persistent secretions

## 2021-07-29 NOTE — Patient Instructions
Mucinex (Guaifenisin) liquid and famotidine liquid prescription sent to [facility] [phone]) (Mailout)  [phone]    Try aspirating with syringe from G-tube opening to reduce gas.    Can take up to 500 mg daily GasX (125mg  x 4 times a day).    Referral made to [facility] Therapy/Evaluation for after radiation to assess swallow function and safety for drinking.

## 2021-07-30 ENCOUNTER — Inpatient Hospital Stay: Payer: MEDICARE

## 2021-07-31 ENCOUNTER — Inpatient Hospital Stay: Payer: MEDICARE

## 2021-07-31 ENCOUNTER — Inpatient Hospital Stay: Payer: MEDICARE | Attending: Radiation Oncology

## 2021-08-01 ENCOUNTER — Inpatient Hospital Stay: Payer: MEDICARE

## 2021-08-01 ENCOUNTER — Ambulatory Visit: Payer: MEDICARE

## 2021-08-01 ENCOUNTER — Telehealth: Payer: MEDICARE

## 2021-08-01 NOTE — Telephone Encounter
@  45- Pt and her daughter came to the clinic , re: redness on the GT site, Per pt's daughter that her mom had some nausea again last night that causes some leakage of food and gastric liquid that causes redness at the GT site,. NO fever , just redness, no pus noted,. I told them that if the redness gets worse and pt's started to have fever to go to ED or URgent care.

## 2021-08-02 ENCOUNTER — Inpatient Hospital Stay: Payer: MEDICARE

## 2021-08-03 MED ORDER — FENTANYL 25 MCG/HR TD PT72
1 | MEDICATED_PATCH | TRANSDERMAL | 0 refills
Start: 2021-08-03 — End: ?

## 2021-08-03 NOTE — Patient Instructions
DISCHARGE INSTRUCTIONS: GI    Return Visit with: Dr. Carleene Cooper as needed  Please follow-up with your Medical Oncologist     Scans: No scans needed at this time     Labs needed: None Needed      GI RADIATION THERAPY - PATIENT EDUCATION      General Instructions:      Many side effects including fatigue, pain/skin reaction on the treatment site, and nausea/vomiting, which you may experience during your treatment, should slowly improve a few weeks after completion of treatment.   You may continue activity and diet as tolerated, unless otherwise noted.   We have a Arboriculturist available if needed and/or upon request.   You may resume taking supplements/vitamins after radiation therapy has been completed.   You may shower, but do not scrub the treated area. Avoid sun exposure to the treated area and use sunscreen. Your skin in the treatment area will be more sensitive to the sun, up to a year after treatment is completed.    Use mild soaps until skin is completely healed.   Continue skin care regimen as directed by your Radiation Oncologist and Radiation Nurses.   In addition to chest pain, please go to the nearest emergency room if you are experiencing increase shortness of breath, passing out/ fainting, unusual or bad headache, sudden inability to speak, see, walk or move, sudden weakness or drooping, dizziness that does not go away, sudden confusion, heavy bleeding, excessive vomiting or diarrhea, severe pain, high fever or seizures.     Site Specific Symptoms: GI       You may experience the following common symptoms during and/or after completion of treatment:     Fatigue    Pressure or discomfort in area of radiation   Skin changes (peeling, blistering, itching, or redness)   Diarrhea    Esophagitis (irritation/inflammation of the esophagus)   Nausea/Vomiting         If needed, there are medications that can be prescribed to make you more comfortable. Please do not hesitate to reach out to your Radiation Oncology team if you start to experience any of these symptoms.    Remember these symptoms are usually temporary and begin to resolve a few weeks after treatment ends.     If the symptoms or side effects mentioned above do not resolve over time or you notice any new symptoms, such as heavy bleeding, a fever or flu-like symptoms, please let us know by calling [facility] Radiation Oncology Mondays-Fridays 8 a.m. - 5 p.m.  [facility] at [phone]   [facility] at [phone]  [facility] at [phone]    Call  [phone] (after hours and weekends, ask for Radiation Oncologist on-call) and go to the Emergency Department for further evaluation.?     Nursing station: [place] [phone]; [address] [phone]; Sherlon Handing [phone]

## 2021-08-04 ENCOUNTER — Non-Acute Institutional Stay: Payer: MEDICARE

## 2021-08-04 DIAGNOSIS — R112 Nausea with vomiting, unspecified: Secondary | ICD-10-CM

## 2021-08-04 DIAGNOSIS — C159 Malignant neoplasm of esophagus, unspecified: Secondary | ICD-10-CM

## 2021-08-05 ENCOUNTER — Non-Acute Institutional Stay: Payer: MEDICARE

## 2021-08-05 ENCOUNTER — Inpatient Hospital Stay: Payer: MEDICARE | Attending: Radiation Oncology

## 2021-08-05 ENCOUNTER — Inpatient Hospital Stay
Admit: 2021-08-05 | Discharge: 2021-08-05 | Disposition: A | Discharge status: Home / Self Care | Payer: MEDICARE | Source: Home / Self Care

## 2021-08-05 DIAGNOSIS — R238 Other skin changes: Secondary | ICD-10-CM

## 2021-08-05 DIAGNOSIS — K9422 Gastrostomy infection: Secondary | ICD-10-CM

## 2021-08-05 DIAGNOSIS — L089 Local infection of the skin and subcutaneous tissue, unspecified: Secondary | ICD-10-CM

## 2021-08-05 LAB — Comprehensive Metabolic Panel
ANION GAP: 9 mmol/L (ref 8–19)
POTASSIUM: 4.2 mmol/L (ref 3.6–5.3)

## 2021-08-05 LAB — Lipase: LIPASE: 24 U/L (ref 13–69)

## 2021-08-05 LAB — CBC: MEAN CORPUSCULAR HEMOGLOBIN: 31.8 pg (ref 26.4–33.4)

## 2021-08-05 MED ORDER — CEPHALEXIN 250 MG/5ML PO SUSR
250 mg | Freq: Four times a day (QID) | ORAL | 0 refills | Status: AC
Start: 2021-08-05 — End: ?

## 2021-08-05 MED ADMIN — MORPHINE SULFATE 4 MG/ML IV SOLN: 2 mg | INTRAVENOUS | @ 03:00:00 | Stop: 2021-08-05 | NDC 00641612525

## 2021-08-05 MED ADMIN — IOHEXOL 350 MG/ML IV SOLN: 100 mL | INTRAVENOUS | @ 06:00:00 | Stop: 2021-08-05 | NDC 00407141491

## 2021-08-05 MED ADMIN — SODIUM CHLORIDE 0.9 % IV BOLUS: 500 mL | INTRAVENOUS | @ 05:00:00 | Stop: 2021-08-05

## 2021-08-05 MED ADMIN — ONDANSETRON HCL 4 MG/2ML IJ SOLN: 4 mg | INTRAVENOUS | @ 07:00:00 | Stop: 2021-08-05 | NDC 60505613000

## 2021-08-05 MED ADMIN — ONDANSETRON HCL 4 MG/2ML IJ SOLN: 4 mg | INTRAVENOUS | @ 03:00:00 | Stop: 2021-08-05 | NDC 60505613000

## 2021-08-05 MED ADMIN — CEPHALEXIN 250 MG/5ML PO SUSR: 500 mg | ORAL | @ 09:00:00 | Stop: 2021-08-05 | NDC 67877054588

## 2021-08-05 MED ADMIN — SODIUM CHLORIDE 0.9 % IV BOLUS: 500 mL | INTRAVENOUS | @ 05:00:00 | Stop: 2021-08-05 | NDC 00338004903

## 2021-08-05 NOTE — ED Provider Notes
[facility]  Emergency Department Service Report    Triage     Erika Kline, a 88 y.o. female, presents with G-tube Problem (Leaking of brownish gastric output around GT site , daughter reports nausea/vomiting causes leaking of fluid around GT site, redness and irritation noted; denies fever , bleeding,or pus discharge; Hx of Esophageal Ca , on radiation)    Arrived on 08/04/2021 at 4:28 PM   Arrived by Wheelchair [4]    ED Triage Vitals   Temp Temp Source BP Heart Rate Resp SpO2 O2 Device Pain Score Weight   08/04/21 1634 08/04/21 1634 08/04/21 1634 08/04/21 1634 08/04/21 1634 08/04/21 1634 08/04/21 1939 08/04/21 1635 08/04/21 1635   37.3 ?C (99.1 ?F) Temporal 134/66 (!) 101 16 97 % None (Room air) Four 46.7 kg (103 lb)       Allergies   Allergen Reactions   ? Amlodipine Besylate Rash   ? Clarithromycin Rash        Initial Physician Contact       Comprehensive Exam Initiated  Contact Date: 08/04/21  Contact Time: 1843    History   Erika Kline is a 88 y.o. female, with history of esophageal cancer on radiation and G-tube dependence, brought in by daughter for nausea, vomiting sensation, and redness and irritation around her G-tube site. Daughter is at bedside providing history as patient expresses she is feeling uncomfortable currently. Per daughter, patient was found to have esophageal cancer in October 2022 and has been receiving radiation for the past 3 weeks in the lower parts of her esophagus. Her last radiation is scheduled for tomorrow. In the last 3 weeks she has noted that she has had progressive nausea. Patient is not on any chemotherapy. Daughter states that patient has been feeling increased nausea and dry heaving in the last 3 days which is worse than prior. Patient reports some tenderness around her G-tube site that daughter attributes to patient's vomiting and putting pressure around her G-tube. Daughter notes more redness and irritation around insertion site in the last 2 days. Patient is still able to tolerate feeding and medication through her G-tube. Patient was advised to come to the Emergency Department by her radiation nurse if she develops any fevers. Daughter denies any fevers but would like to be evaluated for concerns of infection due to noticing more redness. Patient takes Miralax daily. She had 3 bowel moments today that were loose. Patient denies any diarrhea or blood in her stool. She denies any fever, back pain, flank pain, dysuria, chest pain, shortness of breath, congestion, sore throat, rhinorrhea, headache, or vision changes.    Daughter reports patient's G-tube was placed in October 2022 and was replaced once at this hospital 3 weeks ago. Patient is on Zofran and pantoprazole for nausea. She also takes famotidine and Reglan. Patient last took her Zofran today at 14:00.    Past Medical History:   Diagnosis Date   ? Allergy, unspecified not elsewhere classified    ? Arthritis    ? Basal cell carcinoma    ? Cancer (HCC/RAF)     Stomach Cancer S/P chemo   ? Depression    ? Gastric lymphoma (HCC/RAF)    ? Gastric lymphoma (HCC/RAF)    ? Gastric lymphoma (HCC/RAF)    ? Hyperlipidemia    ? Hypertension         Past Surgical History:   Procedure Laterality Date   ? breast implants  1978   ? BREAST SURGERY  Past Family History   family history includes Cancer in her sister; Heart attack in her father.     Past Social History   she reports that she has never smoked. She does not have any smokeless tobacco history on file. She reports that she does not drink alcohol, does not use drugs, and does not engage in sexual activity.     Physical Exam   Physical Exam  Vitals and nursing note reviewed.   Constitutional:       General: She is not in acute distress.     Comments:   (+) Thin-appearing.   HENT:      Head: Normocephalic and atraumatic.      Right Ear: External ear normal.      Left Ear: External ear normal.      Nose: Nose normal.      Mouth/Throat:      Mouth: Mucous membranes are moist.   Eyes:      Extraocular Movements: Extraocular movements intact.      Pupils: Pupils are equal, round, and reactive to light.   Cardiovascular:      Rate and Rhythm: Regular rhythm.      Heart sounds: Normal heart sounds.   Pulmonary:      Effort: Pulmonary effort is normal.      Comments:   Clear to auscultation bilaterally.  Abdominal:      General: There is no distension.      Palpations: Abdomen is soft.      Tenderness: There is no guarding or rebound.      Comments:   (+) Mild epigastric tenderness to palpation.  (+) G-tube located in left upper quadrant with peripheral erythema and small ulcer near the insertion site. No fluid drainage or purulence.  No evidence of peritonitis.   Musculoskeletal:         General: No deformity.   Skin:     Findings: No erythema or rash.   Neurological:      General: No focal deficit present.      Mental Status: She is alert and oriented to person, place, and time.   Psychiatric:         Mood and Affect: Mood normal.         Behavior: Behavior normal.       Medical Decision Making   Erika Kline is a 88 y.o. female, with history of esophageal cancer on radiation and G-tube dependence, brought in by daughter for nausea, vomiting sensation, and redness and irritation around her G-tube site.     Presentation consistent with epigastric ABD pain, redness/pain around insertion site of her G tube and nausea/vomiting. Differential diagnosis includes radiation gastritis, radiation damage to local tissues, cannot rule out pancreatitis, G tube dysfunction, infection, pancreatitis, gallbladder disease. Abdominal exam without peritoneal signs. No evidence of acute abdomen at this time. Well appearing. Low suspicion for acute hepatobiliary disease (includng acute cholecystitis), acute pancreatitis, PUD (including perforation), acute infectious processes (pneumonia, hepatitis, pyelonephritis), atypical appendicitis, vascular catastrophe, bowel obstruction or viscus perforation. Presentation not consistent with other acute, emergent causes of abdominal pain at this time.    Plan: labs, CT A/P, serial reassessment    Medical Decision Making  Amount and/or Complexity of Data Reviewed  Independent Historian:      Details: Daughter at bedside, given patient feeling very uncomfortable.  Labs: ordered.     Details: CBC, CMP, lipase  Radiology: ordered. Decision-making details documented in ED Course.     Details: CT  abdomen and pelvis      Risk  Prescription drug management.        Chart Review   Previous medical records requested.  Pertinent items reviewed:   ED Course      Laboratory Results     Labs Reviewed   COMPREHENSIVE METABOLIC PANEL - Abnormal; Notable for the following components:       Result Value    Sodium 133 (*)     Albumin 3.6 (*)     All other components within normal limits   LIPASE - Normal   CBC       Imaging Results     CT abd+pelvis w contrast   Final Result by Kathryne Hitch., MD (03/05 2309)   IMPRESSION:      1.  No acute CT abnormality in the abdomen or pelvis. A gastrostomy tube is present with the tip and balloon within the stomach. History of esophageal cancer with no definite evidence of metastatic disease in the abdomen or pelvis.         I, Leona Carry, M.D., have reviewed the study and agree with the findings presented here.      Dictated by: Durwin Reges   08/04/2021 10:43 PM      Signed by: Leona Carry   08/04/2021 11:09 PM          Consults         Progress Notes / Reassessments     ED Course as of 08/05/21 0306   Sun Aug 04, 2021   1933 Heart Rate(!): 101  IV fluids ordered   [RK]   2211 10:11 PM I, Molly Maduro MD, have received signout from [facility] on this patient. The time listed is the first encounter I have made in this note. Patient pending CT abdomen for erythema around G tube site to rule out abscess/deep infection or SBO.   [HA]   2349 CT abd+pelvis w contrast  IMPRESSION:  ?  1.  No acute CT abnormality in the abdomen or pelvis. A gastrostomy tube is present with the tip and balloon within the stomach. History of esophageal cancer with no definite evidence of metastatic disease in the abdomen or pelvis. [RK]   Mon Aug 05, 2021   0009 Prescription for Keflex sent to patient's pharmacy. [DY]      ED Course User Index  [DY] Cindy Hazy  [HA] Jacquelyne Balint Waymond Cera., MD  [RK] Cephas Darby III, MD     Labs and CT scan show no acute abnormalitites. Mild hyponatremia but only to 133 No leukocytosis, no elevated liver enzymes. Discussed with patient and daughter. They are comfortable with discharge. She is feeling improved. Likely due to radiation gastritis. Will cover for infection/cellulitis at the site of the G tube. Advised them they use a barrier ointment to protect skin. Return precautions discussed. Pt verbalized understanding and will follow up with her PCP.  ED Procedure Notes     Any ED procedures performed are documented on separate ED procedure notes.    Clinical Impression     1. Skin irritation    2. Malignant neoplasm of esophagus, unspecified location (HCC/RAF)    3. Nausea and vomiting, unspecified vomiting type      Disposition and Follow-up   Disposition: Discharge [1]     Future Appointments   Date Time Provider Department Center   08/05/2021  1:50 PM Aura Fey., MD, MPH MP2 RAD[facility] [place]   08/05/2021  2:15 PM Janeth Rase  C., MD, MPH MP2 RADON[facility] [place]   09/03/2021 11:45 AM Maisie Fus., MD TORR [facility] [place]   09/23/2021  1:00 PM Ather, Robin Searing., MD GASTROSB [facility] [place]       Follow up with:  Evaristo Bury., MD  [address]  [address]  [place] [address]  [phone]    In 2 days  As needed      Return precautions are specified on After Visit Summary.    Discharge Medication List as of 08/05/2021 12:10 AM      START taking these medications    Details   cephalexin 250 mg/5 mL suspension Take 5 mLs (250 mg total) by mouth four (4) times daily for 7 days., Starting Mon 08/05/2021, Until Mon 08/12/2021, Normal             Orders Placed This Encounter   ? CT abd+pelvis w contrast   ? CBC without differential   ? Comprehensive Metabolic Panel   ? Lipase   ? ondansetron 4 mg/2 mL inj 4 mg   ? sodium chloride 0.9% IV soln bolus 500 mL   ? morphine 4 mg/mL inj 2 mg   ? sodium chloride 0.9% IV soln   ? ondansetron 4 mg/2 mL inj 4 mg   ? iohexol (Omnipaque) 350 mg/mL inj 100 mL   ? cephalexin 250 mg/5 mL suspension   ? cephalexin 250 mg/5 mL susp 500 mg     Scribe Signature   I, Cindy Hazy, have acted as a Stage manager for [employer] on behalf of Dr. Junius Argyle on 08/04/2021 at 6:43 PM.    Physician Signature(s)   I have reviewed this note as recorded by DY, who acted as medical scribe, and I attest that it is an accurate representation of my H&P and other events of the ED visit except as otherwise noted.         Junius Argyle Anshika Pethtel A. III, MD  Resident  08/05/21 [phone]

## 2021-08-05 NOTE — ED Notes
Report given to Krystal RN

## 2021-08-05 NOTE — ED Notes
IV [name] RN at bedside attempting to get an IV access at this time.

## 2021-08-05 NOTE — Significant Event
Contrast Extravasation    Evaluating Physician: Jeannette How.     Location: Right wrist    Line Position: Peripheral    Type of Contrast: Omnipaque 350    Estimated Amount Extravasated: 5 ml    Local Swelling: Mild    Skin Color: Normal and Erythematous    Distal Pulses: Normal     Distal Sensation and Motor: Normal      Elevation: Yes    Local Treatment: cold compress and warm compress per department protocol    Event: No Severe Events    Follow up: Primary care: Beryle Beams., MD     Compartment Syndrome and Skin Precautions Provided: Yes     Event related to accession # [id number]

## 2021-08-05 NOTE — Treatment Summary
Radiation Oncology Abdominal On-Treatment Visit Note    Patient: Erika Kline  MRN: [id number]  DOB: September 24, 1933    Date of Service: 08/05/2021    Referring Practitioner: No ref. provider found  Primary Care Provider: Evaristo Bury., MD  Resident Physician: Elsie Lincoln, MD, PhD   Attending Physician: Aura Fey, MD, MPH     Identifying Data: Erika Kline is a 88 y.o. female with PMH of gastric lymphoma in remission since 2005, paroxysmal atrial fibrillation on Apixaban, non-rheumatic MR s/p mitraclip, with a esophageal squamous cell carcinoma, ongoing palliative radiation for obstruction    Treatment Detail:  10/10 fraction     Interval History: Erika Kline has increased stomach pain, partly due to gas. She is using GasX, venting tube but they did not help much. She is taking oxycodone and fentanyl patch and liquid morphine. She presented to [facility] ED last night for worsening pain and was prescribed cephalexin for possible G tube infection. Also severe nausea. She is taking zofran, pantoprazole, famotidine and reglan. Bowel movement OK. Using Miralax daily. Has thick secretions, using glycopyrrolate at night. She is going home today to start home hospice.     Current Toxicity  Bloating: Grade 2: Symptomatic, decreased oral intake; change in bowel function  Fatigue: Grade 1: Fatigue relieved by rest      ECOG - [place] Cooperative Oncology Group performance status: ECOG Score - 3 - Capable of only limited selfcare, confined to bed or chair more than 50% of waking hours.     Physical Exam  Constitutional:       General: She is not in acute distress.     Appearance: She is well-developed.      Comments: In wheelchair, productive oral secretions   HENT:      Head: Normocephalic and atraumatic.   Pulmonary:      Effort: Pulmonary effort is normal. No respiratory distress.   Neurological:      Mental Status: She is alert and oriented to person, place, and time.   Psychiatric:         Thought Content: Thought content normal.       Wt Readings from Last 10 Encounters:   08/04/21 1635 103 lb (46.7 kg)   06/13/21 1423 103 lb (46.7 kg)   06/05/21 1607 102 lb 12.8 oz (46.6 kg)   05/12/21 2023 100 lb 8 oz (45.6 kg)   05/11/21 1947 100 lb 8 oz (45.6 kg)   05/11/21 0004 (!) 96 lb 12.8 oz (43.9 kg)   05/10/21 1754 100 lb (45.4 kg)   10/04/15 1124 107 lb 6.4 oz (48.7 kg)   08/28/15 1348 111 lb (50.3 kg)   06/07/15 1025 112 lb (50.8 kg)   05/17/13 1447 110 lb (49.9 kg)   01/06/13 0858 104 lb (47.2 kg)   11/25/12 1048 106 lb (48.1 kg)     There were no vitals taken for this visit.   Pain Information     No pain information on file          Labs/Radiology:  Lab Results   Component Value Date/Time    HGB 13.4 08/04/2021 08:04 PM    HGB 13.2 07/15/2010 03:17 PM    WBC 5.80 08/04/2021 08:04 PM    WBC 5.74 07/15/2010 03:17 PM    PLT 225 08/04/2021 08:04 PM    PLT 150 07/15/2010 03:17 PM    BUN 18 08/04/2021 08:04 PM    BUN 9 09/18/2011 10:01 AM  BUN 12 03/04/2011 11:06 AM    CREAT 0.75 08/04/2021 08:04 PM    CREAT 0.8 03/04/2011 11:06 AM    NA 133 (L) 08/04/2021 08:04 PM    NA 136 03/04/2011 11:06 AM    K 4.2 08/04/2021 08:04 PM    K 4.8 03/04/2011 11:06 AM    CL 96 08/04/2021 08:04 PM    CL 99 03/04/2011 11:06 AM    CO2 28 08/04/2021 08:04 PM    CO2 29 03/04/2011 11:06 AM       Reviews:  ? In-room imaging checked  ? QA Checked  ? Reviewed treatment setup, dosimetry, dose delivery, and treatment  ? CBC Status reviewed      Plan:    ? GasX up to 500mg /daily  ? Mucinex and famotidine.   ? Consider suction device for persistent secretions  ? Home hospice to start.

## 2021-08-05 NOTE — Discharge Instructions
Emergency Department Discharge Instructions      Summary of your visit  You have been evaluated in the [facility] Emergency Department today for a skin infection.  If the area of inflammation was outlined today in the ER, please return to the ER immediately if the area of redness increases beyond that border.  We have observed you in the ER and have determined that you are stable for discharge at this time.  Please take your Keflex as prescribed.    Follow-Up  Please follow up with your oncologist within one week.  Please also follow up with your primary care physician within three days after being discharged from the ER - you can call to schedule an appointment.  You can find a primary care physician at [facility] by calling [phone].    If you do not have a Primary Care Physician, please call your insurance company or you may call [phone] to establish care with a [facility] physician.  If you are uninsured, please call 2-1-1 to find a free or low-cost clinic in your area. 2-1-1 [place] is the central source for providing information and referrals for all health and human services in [facility] [place]. Our 2-1-1 phone line is open 24 hours, 7 days a week, with trained [employer] prepared to offer help with any situation, any time. Our community services go far beyond phone referrals - explore our website to learn more. If you are calling from outside [facility] or cannot directly dial 2-1-1, you can call [phone].      Return to the Emergency Department if you experience:  Recurrent vomiting  Fevers greater than 100.4 ?F   An increase in area of redness  Warmth around the area  Foul smelling discharge from the area  Increased tenderness around the area  Any other concerning symptoms    Thank you for choosing [facility] for your care. It was a pleasure taking part in your care today, and we wish you the best!

## 2021-08-05 NOTE — ED Notes
COLLECTIVE?NOTIFICATION?08/04/2021 16:28?Kline, Erika?MRN: [id number]    Criteria Met      2 Visits in 30 Days    Security and Safety  No Security Events were found.  ED Care Guidelines  There are currently no ED Care Guidelines for this patient. Please check your facility's medical records system.          Prescription Drug Report (12 Mo.)  Unable to make request to PDMP due to invalid user credentials.    E.D. Visit Count (12 mo.)  Facility Visits   [facility] 3   [facility] 1   [place] 1   Total 5   Note: Visits indicate total known visits.     Recent Emergency Department Visit Summary  Date Facility [facility] Type Diagnoses or Chief Complaint    Aug 04, 2021  Erika Kline [name]  Emergency     Jul 18, 2021  [facility].  Sadd[place]enders.  [place]  [place]  Chief Complaint: Briscoe Deutscher    Jul 01, 2021  [facility].  [place]  [place]gency  Chief Complaint: G TUBE MALF, HISTORY OF ESOPHAGUS CANCER    Jun 24, 2021  [facility].  [place].  [place]  Emergency     May 10, 2021  [facility].  [place]  Emergency      1. Weakness      2. Failure To Thrive      2. Adult failure to thrive        Recent Inpatient Visit Summary  Date Facility [facility] Type Diagnoses or Chief Complaint    May 10, 2021  [facility].  [place]  Oncology      3. Essential (primary) hypertension      4. Paroxysmal atrial fibrillation      5. Malignant neoplasm of esophagus, unspecified      6. COVID-19      7. Weakness      7. Non-Hodgkin lymphoma, unspecified, extranodal and solid organ sites      8. Failure To Thrive      9. Adult failure to thrive        Care Team  No Care Team was found.  This patient has registered at the [facility] Emergency Department   For more information visit: [email or web address]   PLEASE NOTE:     1.   Any care recommendations and other clinical information are provided as guidelines or for historical purposes only, and providers should exercise their own clinical judgment when providing care.    2.   You may only use this information for purposes of treatment, payment or health care operations activities, and subject to the limitations of applicable Collective Policies.    3.   You should consult directly with the organization that provided a care guideline or other clinical history with any questions about additional information or accuracy or completeness of information provided.    ? 2023 [place]. - [email or web address]

## 2021-08-05 NOTE — ED Notes
Pt came back from CT noted with swollen RFA s/p IV contrast given at CT. Noted ice pack in place. Removed IV with intact catheter noted.

## 2021-08-06 ENCOUNTER — Telehealth: Payer: MEDICARE

## 2021-08-06 ENCOUNTER — Ambulatory Visit: Payer: MEDICARE

## 2021-08-06 MED ORDER — FENTANYL 25 MCG/HR TD PT72
1 | MEDICATED_PATCH | TRANSDERMAL | 0 refills | Status: AC
Start: 2021-08-06 — End: 2021-08-08

## 2021-08-06 MED ORDER — FENTANYL 25 MCG/HR TD PT72
1 | MEDICATED_PATCH | TRANSDERMAL | 0 refills
Start: 2021-08-06 — End: ?

## 2021-08-06 NOTE — Telephone Encounter
Appointment Accommodation Request      Appointment Type: Hospital Follow Up    Reason for sooner request: Pt was discharged from [facility] on 08/04/2021.    Date/Time Requested (If any):  As soon as possible this week,    Last seen by MD: 07/05/2021    Any Symptoms:  '[]'$  Yes  '[x]'$  No       If yes, what symptoms are you experiencing:   o Duration of symptoms (how long):     Patient or caller was offered an appointment but declined.    Patient or caller was advised to seek emergency services if conditions are urgent or emergent.    Patient or caller has been notified of the turnaround time of 1-2 business (days).

## 2021-08-06 NOTE — Telephone Encounter
Does she want to do today at 4 or thurs 3 ?

## 2021-08-07 NOTE — Telephone Encounter
I spoke with pt's drt-   They will come in tomorrow.     Future Appointments      Aug 08, 2021  3:00 PM  HOSPITAL FOLLOW UP with Maija B. Beverely Risen, MD  [facility]) [address]  [place] [address]  [phone]     Sep 03, 2021 11:45 AM  New Visit with Myrlene Broker. Posey Pronto, [facility]) [place] [address]  [phone]     Sep 23, 2021  1:00 PM  Provider Transfer with Edgar Frisk, MD  [facility]) [place] [address]  [phone]

## 2021-08-08 ENCOUNTER — Ambulatory Visit: Payer: MEDICARE

## 2021-08-08 MED ORDER — FENTANYL 25 MCG/HR TD PT72
1 | MEDICATED_PATCH | TRANSDERMAL | 0 refills | Status: AC
Start: 2021-08-08 — End: 2021-08-10

## 2021-08-08 MED ORDER — MUPIROCIN 2 % EX OINT
0 refills | Status: AC
Start: 2021-08-08 — End: ?

## 2021-08-08 NOTE — Telephone Encounter
Printed for MD

## 2021-08-08 NOTE — Patient Instructions
-   can use an incentive spirometer to help open up the airways.   - Bactroban ointment for the next week.

## 2021-08-08 NOTE — Progress Notes
GERIATRICS OUTPATIENT PROGRESS NOTE    PATIENT: Erika Kline  MRN: [id number]  DOB: Oct 12, 1933  DATE OF SERVICE: 08/08/2021    PRIMARY CARE PROVIDER: Evaristo Bury., MD      CHIEF COMPLAINT  Chief Complaint   Patient presents with   ? Post Hospital Discharge Follow Up        HISTORY OF PRESENT ILLNESS  Erika Kline is a 88 y.o.      Oncologist Dr. Jerelyn Charles   Rad onc Dr. Janeth Rase     Here with dtrs laural and casey    Last seen by me 2/3     Reviewed progress notes from specialist since last visit, summarized below.      Seen by dr Guilford Shi on 3/6 home hospice start     In ED on 3/5 -   1.  No acute CT abnormality in the abdomen or pelvis. A gastrostomy tube is present with the tip and balloon within the stomach. History of esophageal cancer with no definite evidence of metastatic disease in the abdomen or pelvis.   ?       Given keflex for g tube site infection     10/10 radiation tx on 3/6       Has been sleeping  A lot past few days   g tube is less painful       Oxy 5-10 mg prn    fentanyl 25   zofran     No sob   No fever     bms moving well.     Taking reglan       CHRONIC CONDITIONS  1.  []  Improved  []  Worsened  []  Stable   2.  []  Improved  []  Worsened  []  Stable   3.   []  Improved  []  Worsened  []  Stable     Past Medical History:   Diagnosis Date   ? Allergy, unspecified not elsewhere classified    ? Arthritis    ? Basal cell carcinoma    ? Cancer (HCC/RAF)     Stomach Cancer S/P chemo   ? Depression    ? Gastric lymphoma (HCC/RAF)    ? Gastric lymphoma (HCC/RAF)    ? Gastric lymphoma (HCC/RAF)    ? Hyperlipidemia    ? Hypertension      Past Surgical History:   Procedure Laterality Date   ? breast implants  1978   ? BREAST SURGERY       Family History   Problem Relation Age of Onset   ? Heart attack Father    ? Cancer Sister          MEDICATIONS  Medications that the patient states to be currently taking   Medication Sig   ? ALPRAZolam 0.25 mg tablet Take 1 tablet (0.25 mg total) by mouth daily as needed for Anxiety. Max Daily Amount: 0.25 mg   ? cephalexin 250 mg/5 mL suspension Take 5 mLs (250 mg total) by mouth four (4) times daily for 7 days.   ? famotidine 40 mg/5 mL suspension Take 5 mLs (40 mg total) by mouth two (2) times daily.   ? fentaNYL 25 mcg/hr patch Place 1 patch onto the skin every three (3) days. Max Daily Amount: 1 patch   ? ondansetron 4 mg/2mL solution Take 5 mLs (4 mg total) by mouth every six (6) hours as needed for Nausea or Vomiting.   ? oxyCODONE 5 mg/5 mL solution Give 5 mLs (5  mg total) by Per G Tube route every six (6) hours as needed. Max Daily Amount: 20 mg   ? pantoprazole 2 mg/mL oral suspension Take 20 mLs (40 mg total) by mouth two (2) times daily.           PERTINENT SOCIAL HISTORY  Social History     Socioeconomic History   ? Marital status: Widowed   Tobacco Use   ? Smoking status: Never   Vaping Use   ? Vaping Use: Never used   Substance and Sexual Activity   ? Alcohol use: No   ? Drug use: No   ? Sexual activity: Never   Other Topics Concern   ? Need Help Feeding Yourself? No   ? Need Help Getting from Bed to Chair? No   ? Need Help Getting to the Toilet? No   ? Need Help Getting Dressed? No   ? Need Help Bathing or Showering? No   ? Need Help Walking Across the Road? (Includes [place]raim Hamburger) No   ? Need Help Using the Telephone? No   ? Need Help Taking your Medications? No   ? Need Help Preparing Meals? No   ? Need Help Managing Money? (Tracking Expenses, Paying Bills) No   ? Need Help Doing Moderately Strenuous Housework? (ex. Laundry) No   ? Need Help Shopping for Personal Items? (Toiletries, Medicines) No   ? Need Help Shopping for Groceries? No   ? Need Help Driving? No   ? Need Help Climbing a Flight of Stairs? No   ? Need Help Getting Places Beyond Walking Distance? (Bus, Taxi) No   ? Do you live with someone who assists you at home? No   ? Do you get help from family members or friends in your home? No   ? Do you employ someone to provide health related care or help you in your home? No   ? Do you provide care for a family member? No   ? Does your home have rugs in the hallway? No   ? Does your home have poor lighting? No   ? Does your home lack grab bars in the bathroom? No   ? Does your home lack handrails on the stairs? No   ? Have you noticed any hearing difficulties? No   ? Do you currently participate in any regular activity to improve or maintain your physical fitness? No   ? Do you always wear a seatbelt when you ride in a car? No   ? If you drink alcohol, do you drink more than 7 drinks per week or more than 3 drinks on any given day? No   ? Has anyone ever been concerned about your drinking? No   Social History Narrative    Lives w/ daughter           REVIEW OF SYSTEMS   (Check if unchanged, or note how changed)     [x]  Constitutional  []  Mental Status  []  Mood    []  Eyes  []  ENT  []  Cardiovascular    [x]  Resp  []  GI  []  GU    []  Neuro  []  Endo  []  Heme/Lymph    []  Musculoskeletal   []  Gait/balance  []  Cognition    []  Allergy/Immune  []  Skin             PHYSICAL EXAM (Check if Normal, or note positive findings)  VITALS  BP 122/78  ~ Pulse 88  ~ Temp  36.2 ?C (97.1 ?F) (Forehead)  ~ Ht 5' 5'' (1.651 m)  ~ Wt 104 lb 3.2 oz (47.3 kg)  ~ SpO2 94%  ~ BMI 17.34 kg/m?    BP Readings from Last 3 Encounters:   08/08/21 122/78   08/05/21 119/83   08/05/21 124/72     Wt Readings from Last 3 Encounters:   08/08/21 104 lb 3.2 oz (47.3 kg)   08/05/21 103 lb (46.7 kg)   08/04/21 103 lb (46.7 kg)        Gen  [x]  NAD GI  [x]  Abd       []  Rectal  []  Organomegaly      [x]  Masses [x]  Guard/Rebnd Tend        Eyes  [x]  Conj/Lids  [x]  Pupils  []  Fundi GU  Fem:   []  Ext []  Vagina []   Cervix   []   Uterus/Adnexa         Female:   []  Penis []  Scrotum []  Prost     ENT  []  Ears/otosc  []  Nasal Muc  [x]  Oroph  []  Hearing   Neuro  []  A/O []  CN2-12  []  DTR/bbski    Neck  [x]  Inspect/Palp  []  Thyroid     []  motor/sens   Breast  []  Inspect  []  Palpation   MS  []  Gait       []  Insp/palp     []  Tone      []  ROM Resp  [x]  Effort  [x]  Auscultate  []  Percuss     []  Balance []  Back   CV  [x]  Auscultate  []  Carotids  PULSES: []  Pedal  [] Fem        [x]  Edema   Skin  [x]  Inspect  [x]  Palp   Lymph  [x]  Neck  []  Axillary  []   Femoral   Psych  []  Insight/judgment     [x]  Affect      []  Cognition     Details of positive findings:  Siting in WC   g tube in place, peritube granulation tissue, minimal erythema no e/o pus   Cachectic            RECENT LABS (reviewed by me)   Admission on 08/04/2021, Discharged on 08/05/2021   Component Date Value Ref Range Status   ? Emer. Dept. Info Exchange - 30 day* 08/04/2021 2   Final   ? Emer. Dept. Info Exchange - 180 da* 08/04/2021 5   Final   ? White Blood Cell Count 08/04/2021 5.80  4.16 - 9.95 x10E3/uL Final   ? Red Blood Cell Count 08/04/2021 4.21  3.96 - 5.09 x10E6/uL Final   ? Hemoglobin 08/04/2021 13.4  11.6 - 15.2 g/dL Final   ? Hematocrit 34/74/2595 39.3  34.9 - 45.2 % Final   ? Mean Corpuscular Volume 08/04/2021 93.3  79.3 - 98.6 fL Final   ? Mean Corpuscular Hemoglobin 08/04/2021 31.8  26.4 - 33.4 pg Final   ? MCH Concentration 08/04/2021 34.1  31.5 - 35.5 g/dL Final   ? Red Cell Distribution Width-SD 08/04/2021 42.9  36.9 - 48.3 fL Final   ? Red Cell Distribution Width-CV 08/04/2021 12.5  11.1 - 15.5 % Final   ? Platelet Count, Auto 08/04/2021 225  143 - 398 x10E3/uL Final   ? Mean Platelet Volume 08/04/2021 9.9  9.3 - 13.0 fL Final   ? Nucleated RBC%, automated 08/04/2021 0.0  No Ref. Range % Final    Percent Reference Range Not  Reported per accrediting agency   ? Absolute Nucleated RBC Count 08/04/2021 0.00  0.00 - 0.00 x10E3/uL Final   ? Sodium 08/04/2021 133 (L)  135 - 146 mmol/L Final   ? Potassium 08/04/2021 4.2  3.6 - 5.3 mmol/L Final   ? Chloride 08/04/2021 96  96 - 106 mmol/L Final   ? Total CO2 08/04/2021 28  20 - 30 mmol/L Final   ? Anion Gap 08/04/2021 9  8 - 19 mmol/L Final   ? Glucose 08/04/2021 92  65 - 99 mg/dL Final   ? Creatinine 21/30/8657 0.75  0.60 - 1.30 mg/dL Final   ? Estimated GFR 08/04/2021 78  See GFR Additional Information mL/min/1.65m2 Final   ? GFR Additional Information 08/04/2021 See Comment   Final    GFR >89.........Marland KitchenNormal   GFR 60 - 89...Marland KitchenMarland KitchenNormal to mildly decreased   GFR 45 - 59...Marland KitchenMarland KitchenMildly to moderately decreased   GFR 30 - 44...Marland KitchenMarland KitchenModerately to severely decreased   GFR 15 - 29...Marland KitchenMarland KitchenSeverely decreased   GFR <15.........Marland KitchenKidney failure   The 2021 CKD-EPI creatinine equation was used to calculate the estimated GFR and assumes stable creatinine concentrations.   Results are in mL/min/1.73 square meters.  The patient's eGFR MAY need to be adjusted for drug dosing.   For drug dosing, utilize eGFR or eCrCl.   If using the eGFR in very large or very small patients, then multiply the reported eGFR by the estimated BSA and divide by 1.73 m2, in order to obtain eGFR in units of mL/min.   ? Urea Nitrogen 08/04/2021 18  7 - 22 mg/dL Final   ? Calcium 84/69/6295 9.0  8.6 - 10.4 mg/dL Final   ? Total Protein 08/04/2021 6.7  6.1 - 8.2 g/dL Final   ? Albumin 28/41/3244 3.6 (L)  3.9 - 5.0 g/dL Final   ? Bilirubin,Total 08/04/2021 0.5  0.1 - 1.2 mg/dL Final   ? Alkaline Phosphatase 08/04/2021 107  37 - 113 U/L Final   ? Aspartate Aminotransferase 08/04/2021 33  13 - 62 U/L Final   ? Alanine Aminotransferase 08/04/2021 36  8 - 70 U/L Final   ? Lipase 08/04/2021 24  13 - 69 U/L Final          STUDIES (reviewed by me)         ASSESSMENT AND PLAN        Cancer related pain  C/w rx fentanyl and oxycodone   Cellulitis - complete course of abx     Excessive oral secretions  -     glycopyrrolate (ROBINUL- now off as was making mucus too thick     Anxiety  -     ALPRAZolam 0.25 mg tablet; Take 1 tablet (0.25 mg total) by mouth daily as needed for Anxiety. Max Daily Amount: 0.25 mg  See below       Malignant neoplasm of esophagus, unspecified location (HCC/RAF)  - d/p radiation therapy with Dr. Guilford Shi   -     fentaNYL 25 mcg/hr patch; Place 1 patch onto the skin every three (3) days. Max Daily Amount: 1 patch  - followed by dr Jerelyn Charles   - planning to start hospice     Gastrointestinal tube present (HCC/RAF  -     C/w g tube care and tube feeds     Gait disorder  cw hh pt     Gastroesophageal reflux disease, unspecified whether esophagitis present  -     famotidine 40 mg/5 mL suspension; Take 5 mLs (40 mg  total) by mouth two (2) times daily.  -     pantoprazole 2 mg/mL oral suspension; Take 20 mLs (40 mg total) by mouth two (2) times daily.    Atrial fibrillation, unspecified type (HCC/RAF)  -     metoprolol tartrate 25 mg tablet; Give one-half tablets (12.5 mg total) by Per G Tube route two (2) times daily.  -     apixaban 2.5 mg tablet; Take 1 tablet (2.5 mg total) by mouth two (2) times daily.  -     TSH with reflex FT4, FT3; Future  - was on amio and lasix in the past hold for now, will see cards     Anxiety/MDD   MDD (major depressive disorder), recurrent episode, moderate (HCC/RAF)  - discussed r/b of benzo. Discussed se, ok trial xanax start w/ 1/2 tab prn     Skin lesion  -     Referral to Dermatology    Drug-induced constipation  - c/w prunes   - consider miralax...     Severe protein-calorie malnutrition (HCC/RAF)  - will have nutrition assessment re tube feeds    Nonrheumatic mitral valve regurgitation  S/P mitral valve clip implantation  - chronic stable     Cancer related pain  -     oxyCODONE 5 mg/5 mL solution; Give 5 mLs (5 mg total) by Per G Tube route every six (6) hours as needed. Max Daily Amount: 20 mg  -     fentaNYL 25 mcg/hr patch; Place 1 patch onto the skin every three (3) days. Max Daily Amount: 1 patch    Hyperlipidemia, unspecified hyperlipidemia type  - off atorva     Excessive oral secretions  -     Glycopyrrolate - off     Nausea and vomiting, unspecified vomiting type  -     ondansetron 4 mg/21mL solution; Take 5 mLs (4 mg total) by mouth every eight (8) hours as needed for Nausea or Vomiting.       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   HLD   Depression   Severe malnutrition   Hx of covid   Cancer related pain - fentanyl 12 mcg q 72 hrs, oxycodone 5mg  q 6 hrs prn glycopyrrolate 1mg  at bedtime for secreations   GERD  - pantoprazoe, famotidine         I performed the following items on the day of service:    [x]  Preparing to see the patient (e.g., review of tests)  [x]  Obtaining and/or reviewing separately obtained history   []  Performing a medically appropriate examination and/or evaluation   [x]  Counseling and educating the patient/family/caregiver   []  Ordering medications, tests, or procedures  []  Referring and communicating with other healthcare professionals (when not separately reported)  [x]  Documenting clinical information in the EHR  []  Independently interpreting results and communicating results to patient/family/caregiver    I spent the following total amount of time on these tasks on the day of service:  New Patient     Established Patient  []  15-29 minutes - 99202    []  up to 9 minutes - 99211  []  30-44 minutes - 99203     []  10-19 minutes - 99212   []  45-59 minutes - 99204      []  20-29 minutes - 99213   []  60-74 minutes - 99205   []  30-39 minutes - 99214         [  x] 40-55 minutes - 99215    []  I spent an additional 15-minute-increment(s) for a total of minutes on these tasks on the day of service. [phone] for each additional 15 minutes.)    \    FOLLOW UP  No follow-ups on file.       Author    Marissia Blackham B. Huel Cote 08/08/2021     The above plan of care, diagnosis, orders, and follow-up were discussed with the patient.  Questions related to this recommended plan of care were answered.    There are no Patient Instructions on file for this visit.

## 2021-08-09 MED ORDER — FENTANYL 37.5 MCG/HR TD PT72
37.5 ug | MEDICATED_PATCH | TRANSDERMAL | 0 refills | 30.00 days | Status: AC
Start: 2021-08-09 — End: 2021-08-13

## 2021-08-09 NOTE — Addendum Note
Addended by: Renee Harder B on: 08/09/2021 03:06 PM     Modules accepted: Orders

## 2021-08-10 DIAGNOSIS — I4891 Unspecified atrial fibrillation: Secondary | ICD-10-CM

## 2021-08-10 DIAGNOSIS — Z931 Gastrostomy status: Secondary | ICD-10-CM

## 2021-08-10 DIAGNOSIS — L03311 Cellulitis of abdominal wall: Secondary | ICD-10-CM

## 2021-08-10 DIAGNOSIS — G893 Neoplasm related pain (acute) (chronic): Secondary | ICD-10-CM

## 2021-08-10 DIAGNOSIS — E43 Unspecified severe protein-calorie malnutrition: Secondary | ICD-10-CM

## 2021-08-10 DIAGNOSIS — C159 Malignant neoplasm of esophagus, unspecified: Secondary | ICD-10-CM

## 2021-08-10 DIAGNOSIS — Z09 Encounter for follow-up examination after completed treatment for conditions other than malignant neoplasm: Secondary | ICD-10-CM

## 2021-08-12 NOTE — Telephone Encounter
Spoke to [place] from the pharmacy he said yes it is ok to Rx 2 boxes but they will send it to their PA department for approval.

## 2021-08-13 ENCOUNTER — Ambulatory Visit: Payer: MEDICARE

## 2021-08-13 ENCOUNTER — Telehealth: Payer: MEDICARE

## 2021-08-13 MED ORDER — FENTANYL 37.5 MCG/HR TD PT72
37.5 ug | MEDICATED_PATCH | TRANSDERMAL | 0 refills | 30.00 days | Status: AC
Start: 2021-08-13 — End: ?
  Filled 2021-08-15: qty 10, 30d supply, fill #0

## 2021-08-13 NOTE — Addendum Note
Addended by: Renee Harder B on: 08/12/2021 08:40 PM     Modules accepted: Orders

## 2021-08-14 NOTE — Telephone Encounter
I attempted to call the patient for 1 week follow-up post radiation treatment; no answer.  I left a message for the patient to call us back and I also sent a MyChart message.

## 2021-08-15 ENCOUNTER — Telehealth: Payer: MEDICARE

## 2021-08-15 NOTE — Telephone Encounter
Call Back Request      Reason for call back:  Per pt's daughter, Yunuen Mordan, Amedeo Gory she sent in [facility] paperwork via fax this past weekend. She hasn't heard back. She wants to know if we received it and if we need her assistance in filling it out. The deadline for the paperwork is 3/23. P: [phone]    Any Symptoms:  '[]'$  Yes  '[x]'$  No       If yes, what symptoms are you experiencing:    o Duration of symptoms (how long):    o Have you taken medication for symptoms (OTC or Rx):      If call was taken outside of clinic hours:    '[]'$ Patient or caller has been notified that this message was sent outside of normal clinic hours.     '[]'$ Patient or caller has been warm transferred to the physician's answering service. If applicable, patient or caller informed to please call us back if symptoms progress.  Patient or caller has been notified of the turnaround time of 1-2 business day(s).

## 2021-08-16 ENCOUNTER — Non-Acute Institutional Stay: Payer: MEDICARE

## 2021-08-16 NOTE — Telephone Encounter
PDL Call to Clinic    Reason for Call: Patients daughter is requesting if FMLA paperwork may be signed to her employer ASAP.     Appointment Related?  '[]'$  Yes  '[x]'$  No     If yes;  Date:  Time:    Call warm transferred to PDL: '[x]'$  Yes  '[]'$  No    Call Received by Clinic Representative:Mitzi Hansen     If call not answered/not accepted, call received by Patient Services Representative:

## 2021-08-16 NOTE — Telephone Encounter
Accepted pdl call     Advised daughter that Luanna Cole is in the process of completing the paper work. Luanna Cole pt's daughter is stating the beginning date for the paper work is 07/24/21. Please contact Amedeo Gory once paper work is complete @ [phone].

## 2021-08-19 NOTE — Telephone Encounter
Attempted to reach daughter Amedeo Gory, no answer and mailbox is full, unable to LVM      Faxed FMLA forms as request to [employer] and wanted to know if she wanted a copy for herself.

## 2021-08-20 ENCOUNTER — Telehealth: Payer: MEDICARE

## 2021-08-20 NOTE — Telephone Encounter
Message to Practice/Provider      Message: Marlowe Kays from [facility] called says Dr. Beverely Risen ordered Dema Severin Petrolatum-Mineral Oil (ARTIFICIAL TEARS) 83-15% ophthalmic ointment in January of this year they have not been able to receive order they said it is unavailable. Hey are asking if they can do  Genteal Tears Night time oint 94% /3 % ?    Return call is not being requested by the patient or caller.    Patient or caller has been notified of the turnaround time of 1-2 business day(s).

## 2021-08-20 NOTE — Telephone Encounter
Yes

## 2021-08-20 NOTE — Telephone Encounter
Spoke to [place] from [employer] she is aware of Dr. Sanjuana Kava answer.

## 2021-08-22 NOTE — Telephone Encounter
Nichelle - Please let us know the missing information.  We will complete physician statement.  Thank you.

## 2021-08-22 NOTE — Telephone Encounter
Call Back Request      Reason for call back: Erika Kline called stating that she received a notice from [facility] that they received the forms but there is additional information missing. She mentioned that they advised her that page 4 of 5 section #5 was not answered. Erika Kline was made aware that the form was re faxed to Dr. Janeice Robinson office. She would like a call back from the office to confirm the form was received and see if that additional information was completed. CB# [phone].     Any Symptoms:  '[]'$  Yes  '[x]'$  No       If yes, what symptoms are you experiencing:    o Duration of symptoms (how long):    o Have you taken medication for symptoms (OTC or Rx):      If call was taken outside of clinic hours:    '[]'$ Patient or caller has been notified that this message was sent outside of normal clinic hours.     '[]'$ Patient or caller has been warm transferred to the physician's answering service. If applicable, patient or caller informed to please call us back if symptoms progress.  Patient or caller has been notified of the turnaround time of 1-2 business day(s).

## 2021-08-23 ENCOUNTER — Non-Acute Institutional Stay: Payer: MEDICARE

## 2021-08-30 MED ORDER — ONDANSETRON HCL 4 MG/5ML PO SOLN
4 mg | Freq: Four times a day (QID) | ORAL | 0.00 refills | 8.00000 days | PRN
Start: 2021-08-30 — End: ?

## 2021-08-30 MED ORDER — ALPRAZOLAM 0.25 MG PO TABS
0.25 mg | ORAL_TABLET | Freq: Every day | ORAL | 1 refills | PRN
Start: 2021-08-30 — End: ?

## 2021-08-30 MED ORDER — OXYCODONE HCL 5 MG/5ML PO SOLN
5 mg | Freq: Four times a day (QID) | GASTROSTOMY | 0 refills | PRN
Start: 2021-08-30 — End: ?

## 2021-09-03 ENCOUNTER — Ambulatory Visit: Payer: MEDICARE

## 2021-09-03 MED ORDER — ALPRAZOLAM 0.25 MG PO TABS
0.25 mg | ORAL_TABLET | Freq: Every day | ORAL | 1 refills | Status: AC | PRN
Start: 2021-09-03 — End: ?

## 2021-09-03 MED ORDER — OXYCODONE HCL 5 MG/5ML PO SOLN
5 mg | Freq: Four times a day (QID) | GASTROSTOMY | 0 refills | Status: AC | PRN
Start: 2021-09-03 — End: ?

## 2021-09-03 MED ORDER — KETOCONAZOLE 2 % EX SHAM
TOPICAL | 6 refills | Status: AC
Start: 2021-09-03 — End: ?

## 2021-09-03 MED ORDER — TRIAMCINOLONE ACETONIDE 0.025 % EX CREA
1 refills | Status: AC
Start: 2021-09-03 — End: ?

## 2021-09-03 MED ORDER — ONDANSETRON HCL 4 MG/5ML PO SOLN
4 mg | Freq: Four times a day (QID) | ORAL | 2 refills | Status: AC | PRN
Start: 2021-09-03 — End: 2021-09-06

## 2021-09-03 NOTE — Progress Notes
New Patient Exam  ?  Chief complaint: rash  ?  History of present illness:  Ericah Scotto, a 88 y.o. female referred for:    1. rash Location: scalp  Duration: months Associated symptoms: itch Prior treatment: none   2. Irritated spots on forearms and lower legs     Patient denies any other new or changing lesions of concern.    ?PMH/Meds/All/Soc/Fam hx: Reviewed in [facility]    Personal Hx skin cancer:  Negative for melanoma and positive for non-melanoma skin cancer  Fam Hx skin cancer:  Negative for melanoma and negative for non-melanoma skin cancer     No new changes to PMH/Meds/All/Soc/Fam hx - please refer to today's intake form.  Patient Active Problem List   Diagnosis   ? CAD (coronary artery disease)   ? Benign essential HTN   ? Dyslipidemia   ? OA (osteoarthritis)   ? Depression   ? Lymphoma of cardia of stomach (HCC/RAF)   ? Cystitis   ? Weakness generalized   ? Failure to thrive in adult   ? Esophageal cancer (HCC/RAF)     Outpatient Medications Prior to Visit   Medication Sig   ? ALPRAZolam 0.25 mg tablet Take 1 tablet (0.25 mg total) by mouth daily as needed for Anxiety. Max Daily Amount: 0.25 mg   ? famotidine 40 mg/5 mL suspension Take 5 mLs (40 mg total) by mouth two (2) times daily.   ? fentaNYL 37.5 mcg PT72 Place 1 patch (37.5 mcg) onto the skin every three (3) days. Max Daily Amount: 37.5 mcg   ? glycopyrrolate (ROBINUL) 1 mg tablet 1 tablet (1 mg total) by Per G Tube route three (3) times daily as needed. (Patient not taking: Reported on 08/05/2021.)   ? metoclopramide 5 mg tablet Take 1 tablet (5 mg total) by mouth every six (6) hours as needed.   ? mupirocin 2% ointment Apply to affected area 3 times daily.   ? ondansetron 4 mg/47mL solution Take 5 mLs (4 mg total) by mouth every six (6) hours as needed for Nausea or Vomiting.   ? oxyCODONE 5 mg/5 mL solution Give 5 mLs (5 mg total) by Per G Tube route every six (6) hours as needed. Max Daily Amount: 20 mg   ? pantoprazole 2 mg/mL oral suspension Take 20 mLs (40 mg total) by mouth two (2) times daily.   ? sertraline 20 mg/mL solution Take 1.3 mLs (25 mg total) by mouth daily. (Patient not taking: Reported on 08/08/2021.)     No facility-administered medications prior to visit.     Allergies   Allergen Reactions   ? Amlodipine Besylate Rash   ? Ciprofloxacin Other (See Comments)     Burning in stomach    ? Clarithromycin Rash     ?  Review of systems:   ?Constitutional: Negative  ENMT: Not assessed   Endo: Not assessed   Musculoskelatal: Not assessed   Skin otherwise Negative  Conjunctiva and Lids: Negative    Objective:   General appearance: well-developed/well nourished for stated age, no apparent distress.  Mental status: alert and oriented.   Mood: cooperative, pleasant.  Affect: normal  A examination of skin was performed. This includes examination of the skin of the face, eyelids, ears, scalp, upper and lower extremities. The positive findings are listed below.  The remainder of the exam was unremarkable for suspicious lesions.   ?- Irritated, stuck on brown irritated papule sites involved: left forearm  - scaly gritty  macules sites involved: forearms  - scaly gritty macules sites involved: forearms  - Waxy, slightly erythematous, and ill-defined scaling of the scalp    Assessment/Plan:  Seborrheic dermatitis, flare  --Discussed diagnosis and treatment options with patient today, and explained chronic nature of condition.  --Start ketoconazole 2% shampoo to scalp daily then decrease to weekly once under control.  Advised patient to let sit for 5-10 minutes before washing off.    Dermatitis -Erythematous, scaly ill-defined patches distributed over the arms  ddx Eczema vs contact dermatitis- flaring  -Education and counseling provided in regards to the benign, but chronic nature of this inflammatory skin condition  -Start triamcinolone 0.025% ointment, twice daily to the affected areas. Do not use for greater than 2-3 weeks at a time.  Do not use on face, armpits, or groin. Not to be use continuously, twice daily, every day, for > 1 month at a time.  Side effects include: skin atrophy or thinning, striae or stretch marks to form, telangiectasias or superficial blood vessels to become apparent, hypopigmentation or color changes at the site, and tachyphylaxis  -Education provided that gentle skin care and emollient therapy daily is essential in disease maintenance  Please use short lukewarm showers,avoid long hot bath/showers. Use mild cleanser (such as Dove sensitive skin soap, cetaphil gentle skin cleanser, cerave hydrating cleanser) on dirty areas only such as groin, armpit, pat dry upon exit from shower, apply emollient (such as vaseline,cetaphil, cerave) to moist skin within 3 minutes of exit from shower, use free and clear detergents, avoid products with scents/fragrances, avoid fabric softener. Products that contain scents/fragrance can irritate dry, sensitive skin. Wear soft fabrics that breathe, such as 100 percent cotton. If you want to wear wool and other rough fabrics, wear a soft fabric underneath    Seborrheic Keratoses, Irritated, stuck on brown irritated papule sites involved: left forearm  --The benign nature of the diagnosis was discussed.    Cryotherapy procedure note:  - LN2 administered for 8-10 sec x 1 cycle x 2 lesions  Patient tolerated it well with no complications.   Discussed post-LN2 care and natural hx of PIH and expected time course for improvement.    Actinic Keratosis, scaly gritty macules sites involved: forearms  - Natural history reviewed including small risk of conversion to squamous cell carcinoma over time  - Importance of self-skin exam and photoprotection reviewed  - Discussed benefits and risks of liquid nitrogen treatment  - Pt opts for treatment; procedure note as below.  - Following verbal informed consent, the patient underwent cryotherapy   Cryotherapy procedure note:  - LN2 administered for 5-8 sec x 2 cycles x 5 lesions  Patient tolerated it well with no complications.   Discussed post-LN2 care and natural hx of PIH and expected time course for improvement.    Hx of NMSC  - NER  - 6-12 mo skin checks    Sun protection strategies including sunscreen use with SPF 30+ and skin cancer risk factors reviewed.   ?  Follow-up 1 year     Lorin Picket, M.D.  Dermatology

## 2021-09-03 NOTE — Progress Notes
Disregard

## 2021-09-04 ENCOUNTER — Telehealth: Payer: MEDICARE

## 2021-09-04 ENCOUNTER — Ambulatory Visit: Payer: MEDICARE

## 2021-09-04 DIAGNOSIS — K219 Gastro-esophageal reflux disease without esophagitis: Secondary | ICD-10-CM

## 2021-09-04 MED ORDER — FAMOTIDINE 40 MG/5ML PO SUSR
40 mg | Freq: Two times a day (BID) | ORAL | 6 refills
Start: 2021-09-04 — End: ?

## 2021-09-04 NOTE — Telephone Encounter
Call Back Request      Reason for call back: [facility] calling to request signature from [place], to bill medicare for service. states already faxed paperwork, please assist  cb:  [phone]  Reference number [id number]    Any Symptoms:  '[]'$  Yes  '[x]'$  No       If yes, what symptoms are you experiencing:    o Duration of symptoms (how long):    o Have you taken medication for symptoms (OTC or Rx):      If call was taken outside of clinic hours:    '[]'$ Patient or caller has been notified that this message was sent outside of normal clinic hours.     '[]'$ Patient or caller has been warm transferred to the physician's answering service. If applicable, patient or caller informed to please call us back if symptoms progress.  Patient or caller has been notified of the turnaround time of 1-2 business day(s).

## 2021-09-04 NOTE — Telephone Encounter
PDL Call to Clinic    Reason for Call: Per Ulice Dash from [facility] stated he would like to speak with [facility] nurse practioner.     Appointment Related?  '[]'$  Yes  '[x]'$  No     If yes;  Date:  Time:    Call warm transferred to PDL: '[x]'$  Yes  '[]'$  No    Call Received by [place]   If call not answered/not accepted, call received by Patient Services Representative:

## 2021-09-05 ENCOUNTER — Telehealth: Payer: MEDICARE

## 2021-09-05 MED ORDER — MUPIROCIN 2 % EX OINT
0 refills | Status: AC
Start: 2021-09-05 — End: ?

## 2021-09-05 MED ORDER — ONDANSETRON HCL 4 MG/5ML PO SOLN
4 mg | Freq: Four times a day (QID) | ORAL | 2 refills | Status: AC | PRN
Start: 2021-09-05 — End: ?

## 2021-09-05 NOTE — Telephone Encounter
Call Back Request      Reason for call back: Jordele from [facility] calling to confirm prescription for ondansetron 4 mg/36m solution. Jordele states the normal dispense quantity is 600 ml and that they can't dispense just 60 ml. Jordele requesting a verbal or a new prescription be sent please.     Any Symptoms:  '[]'$  Yes  '[x]'$  No       If yes, what symptoms are you experiencing:    o Duration of symptoms (how long):    o Have you taken medication for symptoms (OTC or Rx):      If call was taken outside of clinic hours:    '[]'$ Patient or caller has been notified that this message was sent outside of normal clinic hours.     '[]'$ Patient or caller has been warm transferred to the physician's answering service. If applicable, patient or caller informed to please call [facility] if symptoms progress.  Patient or caller has been notified of the turnaround time of 1-2 business day(s).

## 2021-09-06 ENCOUNTER — Ambulatory Visit: Payer: MEDICARE | Attending: Gastroenterology

## 2021-09-06 MED ORDER — FAMOTIDINE 40 MG/5ML PO SUSR
40 mg | Freq: Two times a day (BID) | ORAL | 4 refills | Status: AC
Start: 2021-09-06 — End: ?

## 2021-09-06 NOTE — Telephone Encounter
Rerouted famotidine 40 mg oral susp to patient's preferred pharmacy. Previously authorized by Dr. Beverely Risen

## 2021-09-09 NOTE — Telephone Encounter
Call Back Request      Reason for call back: Ulice Dash at [facility] following up on 09/04/21 request.     Please assist.     P: [phone]  F: [phone]   Reference number [id number]    Any Symptoms:  '[]'$  Yes  '[x]'$  No       If yes, what symptoms are you experiencing:    o Duration of symptoms (how long):    o Have you taken medication for symptoms (OTC or Rx):      If call was taken outside of clinic hours:    '[]'$ Patient or caller has been notified that this message was sent outside of normal clinic hours.     '[]'$ Patient or caller has been warm transferred to the physician's answering service. If applicable, patient or caller informed to please call us back if symptoms progress.  Patient or caller has been notified of the turnaround time of 1-2 business day(s).

## 2021-09-11 DIAGNOSIS — Z85828 Personal history of other malignant neoplasm of skin: Secondary | ICD-10-CM

## 2021-09-11 DIAGNOSIS — L820 Inflamed seborrheic keratosis: Secondary | ICD-10-CM

## 2021-09-11 DIAGNOSIS — L570 Actinic keratosis: Secondary | ICD-10-CM

## 2021-09-11 DIAGNOSIS — L309 Dermatitis, unspecified: Secondary | ICD-10-CM

## 2021-09-11 DIAGNOSIS — L219 Seborrheic dermatitis, unspecified: Secondary | ICD-10-CM

## 2021-09-12 NOTE — Telephone Encounter
Forwarded by: [place]    Good morning Samantha,     We are receiving multiple calls from [facility] regarding signature. Please see below messages. I did let Ulice Dash know our office is out patient. Not [facility].     Jones Broom 3 days ago     [facility]  Call Back Request        Reason for call back: Ulice Dash at [facility] following up on 09/04/21 request.      Please assist.      P: [phone]  F: [phone]   Reference number [id number]

## 2021-09-12 NOTE — Telephone Encounter
PDL Call to Clinic    Reason for Call: Ulice Dash calling from [facility], states they faxed over request on 08/29/21 and have been calling to f/u no response.  Requesting to speak to [place]    Appointment Related?  '[]'$  Yes  '[x]'$  No     If yes;  Date:  Time:    Call warm transferred to PDL: '[x]'$  Yes  '[]'$  No    Call Received by Clinic Representative: Ana    If call not answered/not accepted, call received by Patient Services Representative:

## 2021-09-13 NOTE — Treatment Summary
RADIATION ONCOLOGY FINAL TREATMENT SUMMARY    ?PATIENT:  Erika Kline  ?MRN:  [id number]  ?DOB:  [birth year]    ?DATE OF SERVICE:  08/05/2021    ?REFERRING PRACTITIONER: Roxanne Gates., MD  ?PRIMARY CARE PROVIDER: Evaristo Bury., MD  ?RESIDENT PHYSICIAN: Elsie Lincoln, MD, PhD  ?ATTENDING PHYSICIAN: Aura Fey, MD, MPH     ?CLINICAL SUMMARY: 88 y.o. female with PMH of gastric lymphoma in remission since 2005, paroxysmal atrial fibrillation on Apixaban, non-rheumatic MR s/p mitraclip, with a esophageal squamous cell carcinoma. She completed palliative radiation for obstruction on 08/05/21.   ?  ?DIAGNOSIS: esophageal squamous cell carcinoma  ?RADIATION HISTORY:   2/21-08/05/21: Palliative radiation to the esophagus 30Gy in 10 fractions.   ?DATA OF INTERVAL HISTORY:  ? Treatment Data Fields ~ Data ~ Comments    ? T stage ~  ~     ? N stage ~  ~     ? M stage ~  ~     ? Case Type ~ Palliative ~     ? More than 1 site treated? ~ No ~ List sites:    ? Re-irradiation? ~ No ~     ? Clinical Trial? ~ No ~ If applicable, describe trial:              ? For definitive cases:        ? Treatment Setting (RT) ~ RT alone ~     ? Surgery (if applicable) ~ No surgery ~     ? Systemic Therapy (if applicable) ~ No systemic therapy ~     ? Systemic Therapy Setting (if applicable) ~ ---- ~     ? Other Systemic Therapy (if more than one) ~ ---- ~       ?RADIATION THERAPY DETAILS:  ? Treatment Data Fields ~ Data    ? Treatment Location: ~ [facility]    ? Treatment concluded as planned: ~ Yes    ? Details if treatment NOT concluded as planned:  ~     ? Start Dates (m/d/yyyy): ~ 07/23/21    ? End Dates: (m/d/yyyy): ~ 08/05/21    ? Treatment Site: ~ esophagus    ? Total Dose (Gy): ~ 30    ? Number of fractions (indicate frequency if other than daily): ~ 10    ? Elapsed Time (days):  ~ 13    ? Energy: ~ 6X    ? Technique: ~ IMRT ?           Response to Treatment (Disease): Too early to evaluate    Tolerance to Treatment:  [name] had increased stomach pain, partly due to gas, towards the end of RT. She was using GasX, venting tube but they did not help much. She was taking oxycodone and fentanyl patch and liquid morphine. She presented to [facility] ED 08/04/21 for worsening pain and was prescribed cephalexin for possible G tube infection. Also severe nausea. She was taking zofran, pantoprazole, famotidine and reglan. Bowel movement OK. Using Miralax daily. Had thick secretions, using glycopyrrolate at night. She was to start home hospice after the completion of RT.      Disposition:         The patient is asked to follow-up with Dr. Pollyann Kennedy.    cc Patient Care Team:  Evaristo Bury., MD as PCP - General (Medicine, Geriatric Medicine)  Aura Fey., MD, MPH as Consult - Attending (Radiation Oncology)  Roxanne Gates., MD (  Medicine, Hematology & Oncology)       Author:  Mikel Cella [facility] 09/13/2021 8:59 AM

## 2021-09-18 ENCOUNTER — Ambulatory Visit: Payer: MEDICARE

## 2021-09-23 ENCOUNTER — Ambulatory Visit: Payer: MEDICARE | Attending: Gastroenterology

## 2021-09-23 DIAGNOSIS — L089 Local infection of the skin and subcutaneous tissue, unspecified: Secondary | ICD-10-CM

## 2021-09-23 DIAGNOSIS — Z931 Gastrostomy status: Secondary | ICD-10-CM

## 2021-09-23 DIAGNOSIS — K9422 Gastrostomy infection: Secondary | ICD-10-CM

## 2021-09-23 NOTE — Progress Notes
Outpatient Gastroenterology Consult     ATTENDING: Georgetta Haber, MD  PATIENT: Erika Kline  MRN: [id number]  DOB: November 22, 1933  DATE OF SERVICE: 09/23/2021    REFERRING PRACTITIONER: No ref. provider found  PRIMARY CARE PROVIDER: Evaristo Bury., MD    REASON FOR REFERRAL:   No chief complaint on file.       Subjective:     Chief Complaint:  Erika Kline is a 88 y.o. 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 Garlon Hatchet. Per daughter states she has a recurrent infection. Currently on antibiotics.).    Patient admitted to OSH in 04/2021 with nausea/emesis, weight loss, dysphagia. Found to have partially obstructing mass in esophagus at 25cm on EGD with path c/w SCC. Underwent laparoscopic G tube placement with general surgeon Dr. Aris Everts on 04/10/21 c/b abscess around G tube s/p aspiration.    She was seen by Dr. Garlon Hatchet in 07/2021 with concern for infection. She was started on antibiotics for this.   She then came to [facility] ER due to leaking of the tube. It was exchanged by me after she was admitted.     Since then she has been at home on hospice. She describes intermittent sharp pains, some redness, and some discharge. She has been put on antibiotics 4 or 5 times for this. She has a Therapist, music and doctor.     CT a/pl w contrast (08/04/21):   1.  No acute CT abnormality in the abdomen or pelvis. A gastrostomy tube is present with the tip and balloon within the stomach. History of esophageal cancer with no definite evidence of metastatic disease in the abdomen or pelvis.      Past Medical History:  Past Medical History:   Diagnosis Date   ? Allergy, unspecified not elsewhere classified    ? Arthritis    ? Basal cell carcinoma    ? Cancer (HCC/RAF)     Stomach Cancer S/P chemo   ? Depression    ? Gastric lymphoma (HCC/RAF)    ? Gastric lymphoma (HCC/RAF)    ? Gastric lymphoma (HCC/RAF)    ? Hyperlipidemia    ? Hypertension      Patient Active Problem List   Diagnosis   ? CAD (coronary artery disease)   ? Benign essential HTN   ? Dyslipidemia   ? OA (osteoarthritis)   ? Depression   ? Lymphoma of cardia of stomach (HCC/RAF)   ? Cystitis   ? Weakness generalized   ? Failure to thrive in adult   ? Esophageal cancer (HCC/RAF)        Past Surgical History:  Past Surgical History:   Procedure Laterality Date   ? breast implants  1978   ? BREAST SURGERY         Family History: family history includes Cancer in her sister; Heart attack in her father.  [x]   No family history of colorectal cancer, gastric cancer, inflammatory bowel disease, celiac disease, Barretts esophagus.   []   Family history of colorectal cancer    []   Family history of IBD   []   Family history of celiac disease  []   Family history of other GI disease    Social History:  reports that she has never smoked. She does not have any smokeless tobacco history on file. She reports that she does not drink alcohol and does not use  drugs.      Allergies :    is allergic to amlodipine besylate, ciprofloxacin, and clarithromycin.    Medications:  No outpatient medications have been marked as taking for the 09/23/21 encounter (Appointment) with Adylynn Hertenstein, Robin Searing., MD.       Review of Systems:  A complete ROS was performed. Pertinent positives and negatives are in the HPI.  Remainder of 14 systems is negative.      Objective:     Physical Exam:    Vitals: BP 116/78  ~ Pulse 83  ~ Resp 20  ~ Ht 5' 5'' (1.651 m)  ~ SpO2 96%  ~ BMI 17.34 kg/m?     Constitutional:WD/WN alert, appears stated age and cooperative   Abdomen: Abdomen is soft. 18Fr G tube in place There erythema with small amount of granulation tissue.   No drainage or bleeding.    Psychiatric: oriented to time, place and person      Lab Review:  I have   [x] reviewed radiology,  [x] reviewed labs, [x] reviewed diag med test, [x] reviewed & summarized old records, [] requested outside medical records.     Lab Results   Component Value Date    WBC 5.80 08/04/2021    HGB 13.4 08/04/2021    HCT 39.3 08/04/2021    MCV 93.3 08/04/2021    PLT 225 08/04/2021     Lab Results   Component Value Date    CREAT 0.75 08/04/2021    BUN 18 08/04/2021    NA 133 (L) 08/04/2021    K 4.2 08/04/2021    CL 96 08/04/2021    CO2 28 08/04/2021    ALT 36 08/04/2021    AST 33 08/04/2021    ALKPHOS 107 08/04/2021    BILITOT 0.5 08/04/2021    ALBUMIN 3.6 (L) 08/04/2021    LIPASE 24 08/04/2021     No results found for: SRWEST, CRP, GLIADINIGA, GLIADINIGG, ENDOMAB, TRNGLUTIGAAB, IGASER  Lab Results   Component Value Date    INR 1.0 05/10/2021     No results found for: HELPYLAB, HELPYLAGSTL      Imaging:  07/01/21 CTAP:  The common bile duct is dilated measuring up to 10 mm.  There is mild to moderate intrahepatic biliary dilatation.  The pancreatic duct is dilated measuring up to 6 mm at the pancreatic head.  Both the common bile duct and the pancreatic duct appear dilated to the level of the ampulla.    Gallbladder has no evidence of radiopaque gallstone or gallbladder wall thickening.  There is a 1.8 cm likely cyst in the left liver. The spleen, pancreas, and adrenal glands are normal in appearance.    Both kidneys have a prominent extrarenal pelvis, left more than right. There is no significant hydronephrosis.  The urinary bladder is unremarkable.    The bowel has no abnormal dilatation.  Moderate to large colonic stool.  Colonic diverticulosis.  Percutaneous gastrostomy catheter with balloon in the gastric lumen.  There is no evidence of an abscess around the gastrostomy tube or in the abdominal wall.    There is no free fluid or free air.    Osteopenia.  Degenerative changes of the spine.  Mild to moderate superior L2 vertebral compression and mild L3 vertebral compression are age indeterminate.    Minimal pleural effusion at the left lung base.  A few small nodular lung opacities, measuring up to 12 mm in the lateral left lung base.    GI Studies:  04/2021 EGD:  Findings:  There was a friable and firm mass at 25cm from the incisors that was partially obstructing and hemi-circumferential. There was oozing and resistance with attempted passage of the endoscope.   Procedures: Cold forceps biopsies were taken.    Visit Diagnoses  / Problems addressed on 09/23/2021:     No diagnosis found.   Assessment & Plan :       Erika Kline is a 88 y.o. female who presents for:     # Esophageal SCC  # G tube in place  # Skin infection at G tube    Diagnosed with SCC of the esophagus in 04/2021 and s/p surgically placed G tube at that time which was c/b abscess at G tube site s/p drainage.  Since then has had on and off superficial infections. Currently on antibiotics now. On exam tube does not appear to be infected - no induration, purulence, cellulitis although she is currently on antibiotics.  Pt is very concerned about tube and granulation tissue so I will send patient to surgery to see if they can use silver nitrate on granulation tissue.     - Discussed not to put gauze under tube  - Antibiotics as needed   - Referral to surgery to see if silver nitrate can be used on granulation tissue.     F/u as needed     I reviewed the patient's labs, imaging, and/or endoscopies and my interpretation is above.     I reviewed notes from the patient's PCP Evaristo Bury., MD and have incorporated this into the patient history.     The above recommendation were discussed with the patient. The patient has all questions answered satisfactorily and is in agreement with this recommended plan of care.    Karalee Height, MD   09/23/2021 11:48 AM      40 minutes were spent personally by me today on this encounter which may include today's pre-visit review of the chart, time spent during the visit, and  today's time spent  after the visit  documenting and coordinating care.

## 2021-09-23 NOTE — Patient Instructions
Keep tube without the gauze for now  Signs of infection: fever, warmth, tenderness to palpation

## 2021-10-02 ENCOUNTER — Telehealth: Payer: MEDICARE

## 2021-10-02 ENCOUNTER — Ambulatory Visit: Payer: MEDICARE

## 2021-10-02 MED ORDER — TRIAMCINOLONE ACETONIDE 0.025 % EX CREA
1 refills | Status: AC
Start: 2021-10-02 — End: ?

## 2021-10-02 MED ORDER — FAMOTIDINE 40 MG/5ML PO SUSR
40 mg | Freq: Two times a day (BID) | ORAL | 4 refills
Start: 2021-10-02 — End: ?

## 2021-10-02 MED ORDER — KETOCONAZOLE 2 % EX SHAM
TOPICAL | 6 refills | Status: AC
Start: 2021-10-02 — End: ?

## 2021-10-02 NOTE — Telephone Encounter
PDL Call to Clinic    Reason for Call: Patients daughter, Myriam Jacobson, returning Rosie's call regarding the following:   ''Hello,    Called patients daughter no answer. Left a detailed message.    If patient calls back, please transfer to the office.    Thanks,    [employer]''    Appointment Related?  '[x]'$  Yes  '[]'$  No     If yes;  Date: 10/02/21  Time: 1pm    Call warm transferred to PDL: '[x]'$  Yes  '[]'$  No    Call Received by Clinic Representative:  Verlan Friends  If call not answered/not accepted, call received by Patient Services Representative:

## 2021-10-02 NOTE — Telephone Encounter
Appointment Accommodation Request      Appointment Type: return    Reason for sooner request: pt daughter Myriam Jacobson called stating that could not make appt today 5/3 due to pt having cancer and provider is aware of pt situation. If pt may be accommodated for next month. Please contact daughter for appt or leave mychart message, thank you.    Pt daughter stated that pt wanted to let provider know that she really loves Dr. Posey Pronto and is really happy with his services.    Date/Time Requested (If any): June,in the afternoons    Last seen by MD: 09/03/21    Any Symptoms:  '[]'$  Yes  '[x]'$  No       If yes, what symptoms are you experiencing:   o Duration of symptoms (how long):     Patient or caller was offered an appointment but declined.    Patient or caller was advised to seek emergency services if conditions are urgent or emergent.    Patient or caller has been notified of the turnaround time of 1-2 business (days).

## 2021-10-02 NOTE — Telephone Encounter
Hello,    Spoke to patient daughter, scheduled patient for 07/05.    Thanks,    [employer]

## 2021-10-02 NOTE — Telephone Encounter
Hello,    Called patients daughter no answer. Left a detailed message.    If patient calls back, please transfer to the office.    Thanks,    [employer]

## 2021-10-03 MED ORDER — FENTANYL 37.5 MCG/HR TD PT72
37.5 ug | MEDICATED_PATCH | TRANSDERMAL | 0 refills | Status: AC
Start: 2021-10-03 — End: ?

## 2021-10-03 MED ORDER — FAMOTIDINE 40 MG/5ML PO SUSR
40 mg | Freq: Two times a day (BID) | ORAL | 4 refills
Start: 2021-10-03 — End: ?

## 2021-10-03 MED ORDER — ALPRAZOLAM 0.25 MG PO TABS
0.25 mg | ORAL_TABLET | Freq: Every day | ORAL | 1 refills | Status: AC | PRN
Start: 2021-10-03 — End: ?

## 2021-10-29 ENCOUNTER — Ambulatory Visit: Payer: MEDICARE

## 2021-10-31 DEATH — deceased

## 2021-11-25 ENCOUNTER — Ambulatory Visit: Payer: MEDICARE | Attending: Gastroenterology

## 2021-12-04 ENCOUNTER — Ambulatory Visit: Payer: MEDICARE
